# Patient Record
Sex: Female | Born: 1950
Health system: Southern US, Community
[De-identification: ages and names within clinical notes are randomized; demographics above are authoritative.]

## PROBLEM LIST (undated history)

## (undated) DIAGNOSIS — F329 Major depressive disorder, single episode, unspecified: Secondary | ICD-10-CM

## (undated) DIAGNOSIS — E785 Hyperlipidemia, unspecified: Secondary | ICD-10-CM

## (undated) DIAGNOSIS — I10 Essential (primary) hypertension: Secondary | ICD-10-CM

## (undated) DIAGNOSIS — Z8669 Personal history of other diseases of the nervous system and sense organs: Secondary | ICD-10-CM

## (undated) DIAGNOSIS — I2510 Atherosclerotic heart disease of native coronary artery without angina pectoris: Secondary | ICD-10-CM

## (undated) DIAGNOSIS — T8859XA Other complications of anesthesia, initial encounter: Secondary | ICD-10-CM

## (undated) DIAGNOSIS — M1990 Unspecified osteoarthritis, unspecified site: Secondary | ICD-10-CM

## (undated) DIAGNOSIS — M797 Fibromyalgia: Secondary | ICD-10-CM

## (undated) DIAGNOSIS — F419 Anxiety disorder, unspecified: Secondary | ICD-10-CM

## (undated) DIAGNOSIS — F32A Depression, unspecified: Secondary | ICD-10-CM

## (undated) DIAGNOSIS — R7303 Prediabetes: Secondary | ICD-10-CM

## (undated) HISTORY — PX: CARPAL TUNNEL RELEASE: SHX101

## (undated) HISTORY — PX: CHOLECYSTECTOMY: SHX55

## (undated) HISTORY — PX: REPLACEMENT TOTAL KNEE: SUR1224

## (undated) HISTORY — PX: BACK SURGERY: SHX140

## (undated) HISTORY — PX: TOTAL HIP ARTHROPLASTY: SHX124

## (undated) HISTORY — PX: COLONOSCOPY: SHX174

## (undated) HISTORY — DX: Essential (primary) hypertension: I10

## (undated) HISTORY — PX: SHOULDER ARTHROSCOPY: SHX128

## (undated) HISTORY — DX: Personal history of other diseases of the nervous system and sense organs: Z86.69

## (undated) HISTORY — PX: ABDOMINAL HYSTERECTOMY: SHX81

## (undated) HISTORY — PX: CARDIAC CATHETERIZATION: SHX172

## (undated) HISTORY — DX: Hyperlipidemia, unspecified: E78.5

## (undated) HISTORY — PX: TUBAL LIGATION: SHX77

---

## 2000-12-24 ENCOUNTER — Ambulatory Visit (HOSPITAL_COMMUNITY): Admission: RE | Admit: 2000-12-24 | Discharge: 2000-12-24 | Payer: Self-pay | Admitting: General Surgery

## 2000-12-24 ENCOUNTER — Encounter: Payer: Self-pay | Admitting: General Surgery

## 2001-10-29 ENCOUNTER — Encounter: Payer: Self-pay | Admitting: Family Medicine

## 2001-10-29 ENCOUNTER — Ambulatory Visit (HOSPITAL_COMMUNITY): Admission: RE | Admit: 2001-10-29 | Discharge: 2001-10-29 | Payer: Self-pay | Admitting: Family Medicine

## 2001-12-27 ENCOUNTER — Ambulatory Visit (HOSPITAL_BASED_OUTPATIENT_CLINIC_OR_DEPARTMENT_OTHER): Admission: RE | Admit: 2001-12-27 | Discharge: 2001-12-28 | Payer: Self-pay | Admitting: Orthopedic Surgery

## 2002-01-09 ENCOUNTER — Encounter: Admission: RE | Admit: 2002-01-09 | Discharge: 2002-04-09 | Payer: Self-pay | Admitting: Orthopedic Surgery

## 2002-03-17 ENCOUNTER — Ambulatory Visit (HOSPITAL_COMMUNITY): Admission: RE | Admit: 2002-03-17 | Discharge: 2002-03-17 | Payer: Self-pay | Admitting: Orthopedic Surgery

## 2002-03-17 ENCOUNTER — Encounter: Payer: Self-pay | Admitting: Orthopedic Surgery

## 2002-05-06 ENCOUNTER — Encounter: Admission: RE | Admit: 2002-05-06 | Discharge: 2002-06-10 | Payer: Self-pay | Admitting: Neurosurgery

## 2002-07-07 ENCOUNTER — Encounter: Admission: RE | Admit: 2002-07-07 | Discharge: 2002-07-07 | Payer: Self-pay | Admitting: Neurosurgery

## 2002-07-22 ENCOUNTER — Encounter: Admission: RE | Admit: 2002-07-22 | Discharge: 2002-07-22 | Payer: Self-pay | Admitting: Neurosurgery

## 2002-07-24 HISTORY — PX: CORONARY ANGIOPLASTY: SHX604

## 2002-08-01 ENCOUNTER — Observation Stay (HOSPITAL_COMMUNITY): Admission: EM | Admit: 2002-08-01 | Discharge: 2002-08-02 | Payer: Self-pay | Admitting: Cardiology

## 2002-08-05 ENCOUNTER — Ambulatory Visit (HOSPITAL_COMMUNITY): Admission: RE | Admit: 2002-08-05 | Discharge: 2002-08-06 | Payer: Self-pay | Admitting: Cardiology

## 2005-07-20 ENCOUNTER — Ambulatory Visit: Payer: Self-pay | Admitting: Cardiology

## 2006-07-30 ENCOUNTER — Ambulatory Visit: Payer: Self-pay | Admitting: Orthopedic Surgery

## 2006-09-10 ENCOUNTER — Ambulatory Visit: Payer: Self-pay | Admitting: Orthopedic Surgery

## 2006-10-25 ENCOUNTER — Ambulatory Visit: Payer: Self-pay | Admitting: Orthopedic Surgery

## 2006-11-06 ENCOUNTER — Ambulatory Visit (HOSPITAL_COMMUNITY): Admission: RE | Admit: 2006-11-06 | Discharge: 2006-11-06 | Payer: Self-pay | Admitting: Orthopedic Surgery

## 2006-11-06 ENCOUNTER — Ambulatory Visit: Payer: Self-pay | Admitting: Orthopedic Surgery

## 2006-11-08 ENCOUNTER — Ambulatory Visit: Payer: Self-pay | Admitting: Orthopedic Surgery

## 2006-11-09 ENCOUNTER — Encounter (HOSPITAL_COMMUNITY): Admission: RE | Admit: 2006-11-09 | Discharge: 2006-12-09 | Payer: Self-pay | Admitting: Orthopedic Surgery

## 2006-11-29 ENCOUNTER — Ambulatory Visit: Payer: Self-pay | Admitting: Orthopedic Surgery

## 2006-12-24 ENCOUNTER — Ambulatory Visit: Payer: Self-pay | Admitting: Orthopedic Surgery

## 2007-01-28 ENCOUNTER — Ambulatory Visit: Payer: Self-pay | Admitting: Orthopedic Surgery

## 2007-04-11 ENCOUNTER — Ambulatory Visit: Payer: Self-pay | Admitting: Orthopedic Surgery

## 2007-04-12 ENCOUNTER — Encounter: Payer: Self-pay | Admitting: Orthopedic Surgery

## 2007-04-12 DIAGNOSIS — M1710 Unilateral primary osteoarthritis, unspecified knee: Secondary | ICD-10-CM | POA: Insufficient documentation

## 2007-04-12 DIAGNOSIS — M25569 Pain in unspecified knee: Secondary | ICD-10-CM | POA: Insufficient documentation

## 2007-05-02 ENCOUNTER — Encounter: Payer: Self-pay | Admitting: Orthopedic Surgery

## 2007-05-27 ENCOUNTER — Ambulatory Visit: Payer: Self-pay | Admitting: Orthopedic Surgery

## 2007-06-27 ENCOUNTER — Telehealth: Payer: Self-pay | Admitting: Orthopedic Surgery

## 2007-07-03 ENCOUNTER — Ambulatory Visit: Payer: Self-pay | Admitting: Orthopedic Surgery

## 2007-08-08 ENCOUNTER — Ambulatory Visit: Payer: Self-pay | Admitting: Orthopedic Surgery

## 2007-09-04 ENCOUNTER — Encounter: Payer: Self-pay | Admitting: Orthopedic Surgery

## 2007-09-10 ENCOUNTER — Ambulatory Visit: Payer: Self-pay | Admitting: Orthopedic Surgery

## 2007-09-10 ENCOUNTER — Inpatient Hospital Stay (HOSPITAL_COMMUNITY): Admission: RE | Admit: 2007-09-10 | Discharge: 2007-09-13 | Payer: Self-pay | Admitting: Orthopedic Surgery

## 2007-09-13 ENCOUNTER — Encounter: Payer: Self-pay | Admitting: Orthopedic Surgery

## 2007-09-16 ENCOUNTER — Telehealth: Payer: Self-pay | Admitting: Orthopedic Surgery

## 2007-09-24 ENCOUNTER — Ambulatory Visit: Payer: Self-pay | Admitting: Orthopedic Surgery

## 2007-09-24 DIAGNOSIS — Z96659 Presence of unspecified artificial knee joint: Secondary | ICD-10-CM | POA: Insufficient documentation

## 2007-10-08 ENCOUNTER — Encounter: Payer: Self-pay | Admitting: Orthopedic Surgery

## 2007-10-24 ENCOUNTER — Ambulatory Visit: Payer: Self-pay | Admitting: Orthopedic Surgery

## 2007-11-11 ENCOUNTER — Encounter: Payer: Self-pay | Admitting: Orthopedic Surgery

## 2007-11-21 ENCOUNTER — Encounter: Payer: Self-pay | Admitting: Orthopedic Surgery

## 2007-11-26 ENCOUNTER — Encounter: Payer: Self-pay | Admitting: Orthopedic Surgery

## 2007-11-26 ENCOUNTER — Encounter (HOSPITAL_COMMUNITY): Admission: RE | Admit: 2007-11-26 | Discharge: 2007-12-26 | Payer: Self-pay | Admitting: Orthopedic Surgery

## 2007-12-04 ENCOUNTER — Telehealth: Payer: Self-pay | Admitting: Orthopedic Surgery

## 2007-12-11 ENCOUNTER — Ambulatory Visit: Payer: Self-pay | Admitting: Orthopedic Surgery

## 2007-12-27 ENCOUNTER — Encounter (HOSPITAL_COMMUNITY): Admission: RE | Admit: 2007-12-27 | Discharge: 2008-01-26 | Payer: Self-pay | Admitting: Orthopedic Surgery

## 2008-01-15 ENCOUNTER — Ambulatory Visit: Payer: Self-pay | Admitting: Orthopedic Surgery

## 2008-02-17 ENCOUNTER — Telehealth: Payer: Self-pay | Admitting: Orthopedic Surgery

## 2008-03-18 ENCOUNTER — Ambulatory Visit: Payer: Self-pay | Admitting: Orthopedic Surgery

## 2008-03-18 DIAGNOSIS — M6580 Other synovitis and tenosynovitis, unspecified site: Secondary | ICD-10-CM | POA: Insufficient documentation

## 2010-09-19 ENCOUNTER — Other Ambulatory Visit: Payer: Self-pay | Admitting: Orthopedic Surgery

## 2010-09-19 DIAGNOSIS — M25552 Pain in left hip: Secondary | ICD-10-CM

## 2010-09-20 ENCOUNTER — Ambulatory Visit
Admission: RE | Admit: 2010-09-20 | Discharge: 2010-09-20 | Disposition: A | Discharge status: Home / Self Care | Payer: BC Managed Care – PPO | Source: Ambulatory Visit | Attending: Orthopedic Surgery | Admitting: Orthopedic Surgery

## 2010-09-20 DIAGNOSIS — M25552 Pain in left hip: Secondary | ICD-10-CM

## 2010-12-06 NOTE — Discharge Summary (Signed)
[name], [name]                ACCOUNT NO.:  [id number]   MEDICAL RECORD NO.:  [id number]          PATIENT TYPE:  INP   LOCATION:  A331                          FACILITY:  [facility]   PHYSICIAN:  Vickki Hearing, M.D.DATE OF BIRTH:  Jun 07, 1951   DATE OF ADMISSION:  09/10/2007  DATE OF DISCHARGE:  02/20/2009LH                               DISCHARGE SUMMARY   ADMITTING DIAGNOSIS:  Osteoarthritis of the right knee.   DISCHARGE DIAGNOSIS:  Osteoarthritis of the right knee.   OPERATIVE PROCEDURE:  This patient underwent a right total knee  arthroplasty.  The surgery was on the date of admission September 10, 2007, implant DePuy posterior stabilized total knee system with a 3  femur, 3 tibia, 32 patella and 10-mm polyethylene posterior stabilized  insert, done under general anesthetic.   OPERATIVE FINDINGS:  There medial compartment wear, especially the  medial femoral condyle where previous microfracture had been done.  There was moderate random patellofemoral articulation on the patellar  side.  The lateral side was relatively normal.  There was mild varus  alignment to the knee.   The patient tolerated the procedure well, did excellent in therapy.  She  was able to ambulate approximately 130 feet, had range of motion at zero  to 100 degrees on a CPM machine and zero to 98 degrees active assisted  range of motion.   Her follow-up hemoglobin remained at 10.6.  Her PT/INR showed an INR of  1.6 after 3 days of Coumadin.  Otherwise, she did fine.   Wound looked excellent.   She was afebrile with stable vital signs.   She will be discharged home with home health physical therapy three  times a week.  Staples come out September 20, 2007.  She has a  PT/INR  scheduled for Monday, February 23.  A follow-up visit is for March 3 in  the office.   She is full weightbearing as tolerated.   DISCHARGE MEDICATIONS:  She will be discharged on the following  medications:  1. She will  resume her lobe air or Lovaza, continue her Neurontin 400      mg, two tablets at night.  2. Xanax 0.5 mg three times daily.  3. Toprol XL 100 mg daily.  4. Dyazide 37.5/25, one daily.  5. Vitamin E 400 mg daily.  6. Calcium one 500-mg tablet daily.  7. Vitamin C daily.  8. Vitamin B12 daily.  9. Folic acid 1 mg daily.  10.Should take Robaxin 500 mg q.6 p.r.n. for pain for muscle spasm.  11.She is going to use Nicoderm 21 mg patch daily.  12.Percocet 5/325, 1 to 2 tablets q.4 p.r.n. for pain.  13.Coumadin will be 2.5 mg tablet, 2 tablets on Friday, one on      Saturday, and alternate until Thursday, at which she can stop.   Overall condition is improved.   DISPOSITION:  Is to home.      Vickki Hearing, M.D.  Electronically Signed     SEH/MEDQ  D:  09/13/2007  T:  09/13/2007  Job:  [id number]

## 2010-12-06 NOTE — H&P (Signed)
[name], [name]                ACCOUNT NO.:  [id number]   MEDICAL RECORD NO.:  [id number]          PATIENT TYPE:  AMB   LOCATION:  DAY                           FACILITY:  [facility]   PHYSICIAN:  Vickki Hearing, M.D.DATE OF BIRTH:  1951/04/19   DATE OF ADMISSION:  DATE OF DISCHARGE:  LH                              HISTORY & PHYSICAL   CHIEF COMPLAINT:  Right knee pain.   HISTORY:  This is a 60 year old female status post arthroscopy right  knee with microfracture who did well after initial procedure in April  2008.  After approximately 4-6 months, started having increased pain and  swelling.  She was treated with multiple injections, increasing her pain  medication, rest, bracing, did not improve.  Activity modification was  also attempted, and this was unsuccessful in relieving her knee pain.  Her knee pain worsened.  It was finally recommended that she undergo  knee replacement surgery which she consented to.  She has medications of  Xanax 0.5 mg q.6h. p.r.n., Toprol 50 mg daily, Dyazide 37.5 mg daily,  Travatan, Neurontin 1 in the morning 2 at night currently on hold.   ALLERGIES:  PENICILLIN AND NIACIN.   MEDICAL HISTORY:  1. Fibromyalgia.  2. Variations in blood pressure with elevation.   PAST SURGICAL HISTORY:  1. She had a hysterectomy.  2. She had a rotator cuff repair.  3. Carpal tunnel release.  4. She had a catheterization.  5. She had a partial medial meniscectomy and right knee microfracture      medial femoral condyle in April 2008.   She is a smoker, does not drink.   Vital signs will be re-recorded during her preop visit.  Her appearance  was normal.  She had normal development, grooming, hygiene, body  habitus.  HEAD AND NECK:  Normocephalic, atraumatic.  No cervical nodes.  Pulse is normal in all 4 extremities.  SKIN:  Intact with no rashes or lesions.  Knee exam showed a right leg antalgic gait.  Skin over the right knee  was normal.  There were  no major deformities.  She had slight varus, no  swelling in the knee.  Medial joint line was very tender and painful for  her.  Her motor exam was normal.  She had good reflexes, knee and ankle.  Upper extremities showed no abnormalities to visual inspection or  palpation.  There was no instability, contracture, subluxation, atrophy  or tremor.   Right knee exam showed flexion of 125 degrees, -2 on extension.  Negative drawer test and negative collateral ligament tests.   IMPRESSION:  Degenerative joint disease right knee, 715.96.   PLAN:  Right total knee replacement on February 17.   I have discussed the procedure with the patient.  I have answered her  questions.  I discussed bleeding, infection, nerve and vascular  injuries, diagnosis of osteoarthritis with failure to respond to  conservative treatment has been explained.  Surgery is to relieve pain.  Motion and function are secondary gains.  The patient demonstrates  understanding of this discussion.  She was advised to  go to preop total  knee replacement classes.  Specific to this procedure, risks include  stiffness, pain, loosening, and infection along with deep vein  thrombosis, pulmonary embolus.   She is currently on Lorcet 10/650 which may pose a problem in terms of  postop pain relief.   The patient to have right total knee as stated.  The patient will follow  up 2 weeks after surgery.  Appointment has been arranged.      Vickki Hearing, M.D.  Electronically Signed     SEH/MEDQ  D:  09/09/2007  T:  09/09/2007  Job:  [id number]   cc:   [facility]

## 2010-12-06 NOTE — Op Note (Signed)
NAME:  [name], [name]                ACCOUNT NO.:  [id number]   MEDICAL RECORD NO.:  [id number]          PATIENT TYPE:  AMB   LOCATION:  DAY                           FACILITY:  [facility]   PHYSICIAN:  Vickki Hearing, M.D.DATE OF BIRTH:  1951/01/14   DATE OF PROCEDURE:  09/10/2007  DATE OF DISCHARGE:                               OPERATIVE REPORT   HISTORY:  This is a 60 year old female who is status post an  arthroscopy, partial medial meniscectomy of the right knee, with  microfracture of the medial femoral condyle, arthritis and torn medial  meniscus.  She did well for approximately six months then started to  deteriorate with increased pain and frequent effusions.  We treated her  at that point with activity modification, bracing, injections of  cortisone, increased her pain medicine, tried some arthritic medications  and she did not improve. Her medial joint space progressively narrowed  and we recommended total knee replacement.  Informed consent was done in  the office.  She consented for right total knee arthroplasty.   PREOPERATIVE DIAGNOSIS:  Osteoarthritis of the right knee.   POSTOPERATIVE DIAGNOSIS:  Osteoarthritis of the right knee.   PROCEDURE:  Right total knee replacement.   IMPLANTS:  We used DePuy posterior stabilized total knee with a 3 femur,  3 tibia, 32 patella and a 10 mm polyethylene posterior stabilized  insert.  She also had a pain pump catheter inserted in the subcutaneous  space using 0.25% Marcaine.   PROCEDURE IN DETAIL:  After proper identification of the patient,  updating the history and physical, the patient was taken to surgery  where she had general anesthetic. Vancomycin had been started prior.  A  Foley catheter was inserted. A tourniquet was applied to her right  proximal thigh.  The right leg was then prepped with Hibiclens due to a  Betadine allergy.  After sterile prep and drape, the time out procedure  was completed and said procedure  was confirmed and patient identity  confirmed.  The limb was exsanguinated with a 6 inch Esmarch and the  tourniquet was inflated to 300 mmHg were it stayed for 78 minutes.   A straight incision was made over the patella, extended proximally and  distally down to the extensor mechanism.  A medial parapatellar incision  was performed.  The patella was everted laterally.  The soft tissue  sleeve medially was elevated to the mid coronal plane.  The medial and  lateral menisci were resected.  The ACL and PCL were also resected.   OPERATIVE FINDINGS:  At this point, we saw severe wear of the medial  compartment, especially on the medial femoral condyle.  There was  moderate wear of the patellofemoral articulation on the patellar side.  The lateral side seemed relatively normal.  There was mild varus  alignment to the knee  The tibial guide was set for a neutral cut with 0  degrees of slope.  We referenced the medial worn compartment and  undercut the defect by 4 mm while we used an oscillating saw to remove  the proximal tibia.  We then turned our attention to the femur.  A drill was inserted into  the canal.  The canal was decompressed with suction irrigation.  The  distal femoral cut was set for 5 degrees with a 10 mm resection. The  distal femoral cutting block was pinned in place, distal femoral cut was  made and found to be flat.  The extension space was measured using a  spacer block and found to be adequate.   The femur was then sized to a size 3.  The femur was cut in 3 degrees  external rotation. Four cuts were made with an oscillating saw.  This  flexion gap was then checked and found to equal the extension gap with  good count lateral ligament  tension.  The box cut was then made. The  patella was cut from a 22 mm to 11 mm thickness and the three peg holes  were drilled.  A trial reduction was performed with the 10 mm insert. No  further releases were necessary.  The patella  tracked normally with a no  touch technique.  The femoral lug holes were drilled, the proximal tibia  was prepared with the reamer and punch.   The knee was irrigated and the bone was dried while the cement was  mixed. We then cemented the components in place with a 10 mm trial  posterior stabilized insert. Range of motion was 2-3 degrees of  hyperextension with 125 degrees of flexion with good stability at 90 and  0 degrees.  The trial insert was removed.  Cement had been allowed to  cure.  Excess cement was removed from the knee.  The knee was irrigated  and a 10 mm polyethylene insert was then placed, checked for security,  and the knee was reduced. Range of motion was checked. The  patellofemoral kinetics were good. The flexion extension was - to 3  degrees hyperextension plus 125 degrees of flexion.   The knee was then injected 0.25% Marcaine with epinephrine, 30 mL total.  The extensor mechanism was closed with interrupted #1 Bralon sutures  with the knee in flexion.  0 and 2-0 Monocryl were then used to close  the skin over a pain pump catheter.  Radiographs were taken  intraoperatively, alignment was confirmed, and position of implants were  deemed excellent.  Skin staples were used to close the skin.  A sterile  dressing was applied.  The limb was wrapped from the foot to the groin.  The patient was extubated and taken to the recovery room where she was  placed in a CPM at 0 to 90 degrees.  She will be placed on Coumadin  starting tonight with 10 mg and titrated up to an INR of 2.  Otherwise,  standard total knee protocol.      Vickki Hearing, M.D.  Electronically Signed     SEH/MEDQ  D:  09/10/2007  T:  09/10/2007  Job:  [id number]

## 2010-12-09 NOTE — Discharge Summary (Signed)
NAME:  [name], [name] NO.:  [id number]   MEDICAL RECORD NO.:  [id number]                   PATIENT TYPE:  OIB   LOCATION:  6526                                 FACILITY:  [facility]   PHYSICIAN:  Jonelle Sidle, M.D. [facility]        DATE OF BIRTH:  03-Feb-1951   DATE OF ADMISSION:  08/05/2002  DATE OF DISCHARGE:  08/06/2002                           DISCHARGE SUMMARY - REFERRING   PROCEDURE:  1. Cardiac catheterization.  2. Coronary arteriogram.  3. Left ventriculogram.   HOSPITAL COURSE:  [name] is a 60 year old female with a strong family  history of coronary artery disease as well as hypertension, hyperlipidemia,  and tobacco abuse. She was admitted to [facility] hospital on 08/01/02 for  chest pain and transferred to [facility] for further evaluation.  She ended up being discharged on 08/03/02 with an outpatient stress test  performed on 1/12. The stress test was abnormal, and she was readmitted to  the hospital on 08/05/02 for cardiac catheterization and further evaluation.   [name] had a cardiac catheterization which showed no significant disease  in the left main circumflex RCA. Her EF was normal with no MR. She had a 40  to 50% stenosis in the LAD as well as an exercise treadmill test that was  positive for ischemia in this distribution, and therefore decision was made  to perform an intervention. She had PTCA and stent with a Cypher stent to  the LAD, reducing the stenosis to 0 with TIMI-III flow. There was less than  20% residual stenosis in the diagonal. This was non-flow limiting. She  tolerated the procedure well, and sheath was removed without difficulty. She  was placed on Integrilin for 18 hours and Plavix for three months.   The next day, [name] had no significant problems with her groin site and  was ambulating without chest pain or shortness of breath. Her postprocedure  laboratory values were within normal limits except  for a potassium of 3.1.  This will be supplemented prior to discharge. Pending completion of her  Integrilin and additional ambulation, she is considered stable for discharge  on 08/06/02 with outpatient followup arranged.   LABORATORY DATA:  Hemoglobin 12.0, hematocrit 34.9, WBCs 8.3, platelets 292.  Sodium 134, potassium 3.1, chloride 101, CO2 38, BUN 11, creatinine 0.7,  glucose 96. CK-MB postprocedure 97/2.0.   DISCHARGE CONDITION:  Stable.   DISCHARGE DIAGNOSES:  1. Unstable anginal pain, status post percutaneous transluminal coronary     angioplasty and Cypher stent to the LAD this admission.  2. No other critical coronary artery disease and preserved EF by     catheterization this admission.  3. Hypertension.  4. Hyperlipidemia.  5. Hypokalemia.  6. Obesity.  7. Tobacco abuse.  8. Family history of premature coronary artery disease.  9. Status post hysterectomy.  10.      Allergy to penicillin and Niacin.  11.  Status post rotator cuff repair and carpal tunnel.  12.      Chronic back pain.  13.      Fibromyalgia.   DISCHARGE INSTRUCTIONS:  Her activity level is to include no driving or  strenuous activity for two days. She is to stick to a low-fat diet. She  states that she is on the Atkin's diet, and this was discussed. She is to  call the office with problems with the catheterization site. She is to see  the P.A. for Dr. [name] on 1/28 at 1:45. She is to follow up with her  family physician as needed.     Lavella Hammock, P.A. [facility]                  Jonelle Sidle, M.D. [facility]    RG/MEDQ  D:  08/06/2002  T:  08/06/2002  Job:  [id number]   cc:   Selinda Flavin  [address]  [place]  [place] [address]  Fax: [phone]

## 2010-12-09 NOTE — Op Note (Signed)
[place]  Patient:    [name], [name] Visit Number: [id number] MRN: [id number]          Service Type: DSU Location: [facility] Attending Physician:  Teena Dunk Dictated by:   Sharlot Gowda., M.D. Proc. Date: 12/27/01 Admit Date:  12/27/2001                             Operative Report  PREOPERATIVE DIAGNOSES: 1. Rotator cuff tear. 2. Labral tear. 3. Impingement, acromioclavicular joint arthritis. 4. Carpal tunnel. 5. Synovitis of wrist.  POSTOPERATIVE DIAGNOSES: 1. Rotator cuff tear. 2. Labral tear. 3. Impingement, acromioclavicular joint arthritis. 4. Carpal tunnel. 5. Synovitis of wrist.  PROCEDURE: 1. Open rotator cuff repair, acromioplasty. 2. Arthroscopic debridement, torn labrum. 3. Open distal clavicle excision. 4. Open right carpal tunnel release. 5. Synovectomy, right wrist.  SURGEON:  W. Su Ley., M.D.  ASSESSMENT:  Jamelle Rushing, P.A.  INDICATION:  A 60 year old with nerve conduction-proven, rather severe carpal tunnel, right-sided rotator cuff tear, thought to be amenable to outpatient with overnight hospitalization.  DESCRIPTION OF PROCEDURE:  Sterile prep and drape, exsanguination of the arm, inflation of arm tourniquet to 300.  Curvilinear incision made in line with the thenar crease, dissection carried down on the ulnar side of the transverse carpal ligament.  Release of the ligament on the ulnar side of the ligament distally to the level of the superficial arch and proximally to the antebrachial fascia of the wrist.  The nerve was under moderate constriction. Mild synovitis was present within the flexor tendons, was debrided. Irrigation and closure with nylon with placement of a bulky dressing.  Re-prep and drape in the beach chair position.  Arthroscopic approach to the anterior, lateral, and posterior portals.  About a 2-3 cm tear was identified.  There was moderate degenerative  tearing in the anterior superior labrum, which was debrided with the arthroscope separate from the open procedure.  The procedure was next converted to an open procedure.  Splitting of the deltoid 2-3 cm distal to the tip of the acromion, exposure of the clavicle, and extremely hypertrophied and degenerative clavicle was excised over 1-1.5 cm with additional resection of a very hypertrophied anterior leading edge of the acromion, with resection of about 6-7 mm of bone on the anterior leading edge of the acromion.  This revealed the cuff tear.  The cuff edges were freshened with a 15 blade.  Bursectomy was carried out.  One absorbable Arthrex anchor was placid with two sutures on it, with one Mitek anchor as an anchoring stitch on the greater tuberosity to create essentially a watertight repair of the 2 cm tear in the supraspinatus.  Closure was effected with #1 Tycron on the deltoid and aponeurosis, followed by 0 Vicryl, 2-0 Vicryl, and Monocryl. Marcaine without epinephrine infiltrated in the skin of the shoulder and the hand, and the hand was placed in a bulky dressing, volar plaster splint, and redressed.  Taken to the recovery room in stable condition. Dictated by:   Sharlot Gowda., M.D. Attending Physician:  Teena Dunk DD:  12/27/01 TD:  12/30/01 Job: [id number]

## 2010-12-09 NOTE — Cardiovascular Report (Signed)
NAME:  CAYLIE, SANDQUIST NO.:  [id number]   MEDICAL RECORD NO.:  [id number]                   PATIENT TYPE:  OIB   LOCATION:  6526                                 FACILITY:  [facility]   PHYSICIAN:  Salvadore Farber, M.D. [facility]         DATE OF BIRTH:  [birth year]   DATE OF PROCEDURE:  08/05/2002  DATE OF DISCHARGE:                              CARDIAC CATHETERIZATION   PROCEDURE:  Pressure wire of the left anterior descending artery, stent to  left anterior descending artery with kissing balloon angioplasty of diagonal  #1.   INDICATIONS FOR PROCEDURE:  The patient is a 60 year old lady who presented  last week with chest pain suggestive of myocardial ischemia; however,  electrocardiogram was normal, and cardiac enzymes were negative after a  prolonged period of pain.  Furthermore, echocardiogram done during pain  demonstrated no regional wall motion abnormality.  She was discharged to  home and underwent an outpatient exercise Cardiolite yesterday.  This  demonstrated anterolateral ischemia.  She was, therefore, scheduled for  catheterization today.  It was performed by Drs. Harkin and Hochrein who  found moderate stenosis of the LAD at the ostium of the first diagonal  branch.  Because of the contradictory findings on multiple studies and a  moderate stenosis, I am asked to perform pressure wire interrogation of the  lesion with possible intervention based on the flow wire results.   PROCEDURAL TECHNIQUE:  Anticoagulation was initiated with heparin to achieve  an ACT of greater than 250 seconds.  A JL 3.5 guide was advanced over wire  and engaged in the ostium of the left coronary artery via the preexisting  right femoral arterial sheath.  The flow wire was then advanced into the  proximal LAD where pressure was normalized.  It was then advanced distal to  the lesion.  Intracoronary adenosine, 42 mcg, was administered.  This  demonstrated an FFR of 0.73  which suggests a physiologically significant  stenosis; therefore, plans were made for intervention.   Integrilin was given and adjunctive heparin given to maintain an ACT of  greater than 225 seconds.  A BMW wire was advanced into the distal LAD.  The  lesion was directly stented using a 2.5 x 18-mm CYPHER, deployed at 14  atmospheres.  With this deployment, there was a severe ostial stenosis of  the first diagonal branch with TIMI 1-2 flow in the first diagonal.  To  avoid running a wire under a stent strut, the stent was then post dilated  using a 2.5 x 18-mm PowerSail at 16 atmospheres.  A Whisper wire was then  advanced into the first diagonal branch with some difficulty.  A 2.0 x 15-mm  Maverick was then used to perform balloon angioplasty of the D-1 ostium for  a total of two inflations at 8 atmospheres for a total of 120 seconds.  This  resulted in restoration of TIMI-3 flow and  approximately 20% residual  stenosis.  I then finished with kissing balloon angioplasty of the D-1 and  LAD using a 2.0-mm Maverick in the D-1 and a 2.5-mm Maverick in the LAD,  both at 6 atmospheres for 30 seconds.  Final angiograms demonstrated no  residual stenosis in the LAD and approximately 20% residual stenosis in D-1.  There was a possible nonflow-limiting dissection in D-1.  Flow was TIMI-3 in  both vessels.    IMPRESSION/RECOMMENDATIONS:  Careful stenting of the left anterior  descending artery with kissing balloon angioplasty of the moderate-sized  first diagonal branch.  Sheath will be removed when the ACT is less than 150  seconds.  Integrilin will be continued for 18 hours.  Plavix should be  continued for a minimum of three months given the drug-eluting stent placed.  Aspirin will be continued indefinitely.                                               Salvadore Farber, M.D. [facility]    WED/MEDQ  D:  08/05/2002  T:  08/06/2002  Job:  [id number]   cc:   Jonelle Sidle, M.D. [facility]  [address]  [place]  [place] [address]  Fax: [phone]   Selinda Flavin  [address]  [place]  [place] [address]  Fax: [phone]

## 2010-12-09 NOTE — Op Note (Signed)
[name], [name]                ACCOUNT NO.:  [id number]   MEDICAL RECORD NO.:  [id number]          PATIENT TYPE:  AMB   LOCATION:  DAY                           FACILITY:  [facility]   PHYSICIAN:  Vickki Hearing, M.D.DATE OF BIRTH:  [birth year]   DATE OF PROCEDURE:  11/06/2006  DATE OF DISCHARGE:  11/06/2006                               OPERATIVE REPORT   DIAGNOSES:  1. Posterior horn tear of medial meniscus.  2. Bone contusion plus or minus avascular necrosis, medial femoral      condyle.   INDICATION:  This patient is 61 years old.  She has medial knee pain on  the right.  She was treated with ibuprofen, Darvocet, activity level  modification, reduced activity, bracing for 6 weeks, did not improve and  presented with acute pain and swelling.  Even after that treatment,  aspiration of the knee was done; the patient improved.  Cortisone was  injected, but since she had not improved after 6-8 weeks of treatment,  we decided to go ahead and perform an arthroscopy.   POSTOPERATIVE DIAGNOSES:  1. Arthritis.  2. Torn medial meniscus.   SURGEON:  Vickki Hearing, M.D.   ANESTHETIC:  Spinal.   OPERATIVE FINDINGS:  There was a tear of the posterior horn of the  medial meniscus.  There was a chondral lesion of medial femoral condyle.   PROCEDURE:  Partial medial meniscectomy, microfracture of medial femoral  condyle, right knee.   DETAILS OF PROCEDURE:  After properly identifying the patient in the  preop holding area and marking the right knee as the surgical site,  surgeon countersigned this, history and physical was updated,  antibiotics were started and the patient was taken to the operating room  for spinal anesthetic.   After sterile prep and drape, a time-out procedure was completed.   Diagnostic arthroscopy was performed through a lateral portal.   A probe was introduced through a medial portal.  Articular structures  and intra-articular ligaments were probed;  findings are as listed.  Partial medial meniscectomy was performed with a duckbill forceps and  motorized shaver.  A probe was used to confirm that the meniscus rim was  stable.   Next, a chondral pick was used to perform a medial femoral condyle  microfracture.   The arthroscopy fluid was stopped to observe the bleeding from the  microfracture holes that were placed in the bone; this was successful.   Knee was then irrigated and closed with Steri-Strips.  We injected  Marcaine and applied sterile dressing.   POSTOPERATIVE PLAN:  CPM, nonweightbearing for 6 weeks, physical therapy  to start next week, followup next week.      Vickki Hearing, M.D.  Electronically Signed     SEH/MEDQ  D:  11/30/2006  T:  11/30/2006  Job:  [id number]

## 2010-12-09 NOTE — Cardiovascular Report (Signed)
NAME:  [name], SIRES NO.:  [id number]   MEDICAL RECORD NO.:  [id number]                   PATIENT TYPE:   LOCATION:                                       FACILITY:   PHYSICIAN:  Vida Roller, M.D.                DATE OF BIRTH:  [birth year]   DATE OF PROCEDURE:  08/05/2002  DATE OF DISCHARGE:                              CARDIAC CATHETERIZATION   PRIMARY CARDIOLOGIST:  Jonelle Sidle, M.D.   PROCEDURE PERFORMED:  1. Left heart catheterization.  2. Selective coronary angiography.  3. Left ventriculography.  4. Right femoral artery arteriogram.   HISTORY OF PRESENT ILLNESS:  The patient is a 60 year old white female  followed by Dr. Diona [name] in [place] who presents with recurrent atypical chest  discomfort. Was evaluated in the clinic with an echocardiogram which  revealed preserved left ventricular ejection fraction with no evidence of  wall motion abnormalities.  She was referred for a perfusion study in which  she received adenosine that showed a preserved left ventricular function  with evidence of an anterior defect from mid to apex in the ventricle,  consistent with ischemia.  She was referred for elective heart  catheterization.   DETAILS OF PROCEDURE:  After obtaining informed consent, the patient was  brought to the cardiac catheterization laboratory in the fasting state.  There she was prepped and draped in the usual sterile manner, and conscious  sedation was obtained using 1 mg of Versed.  The right femoral area was  anesthetized using 1% lidocaine without epinephrine, and the right femoral  artery was cannulated using a modified Seldinger technique with a #6 French  10-cm sheath.  Left heart catheterization was performed using a #6 French  Judkins left #4 and a #6 [place] non-torquing right cardiac catheter.  Left  ventriculography was performed using an angled pigtail catheter which was  advanced under fluoroscopic guidance up to  the ascending aorta.  It was then  prolapsed across the aortic valve.  Pressure tracings were obtained in the  ventricle, and then the catheter was connected to the Medrad system where a  power injection at 10 cc/second for a total of 30 cc at a half-second rate  of rise, 600 psi.  Left ventriculogram was imaged in the 30-degree RAO view.  At the conclusion of the procedures, the catheters were removed.  Right  femoral arteriogram was performed using a hand injection through the sheath.  The sheath was then maintained in place, and please see the subsequent  interventional procedure dictated by my colleague, Dr. Geralynn Rile.   RESULTS:  1. Aortic pressure was measured at 140/60 with a mean arterial pressure of     93 mmHg.  2. Left ventricular pressure was measured at 140/80 with an end diastolic     pressure of 15 mmHg.   SELECTIVE CORONARY ANGIOGRAPHY:  1. The left main coronary artery  is a small vessel which is angiographically     normal.  2. The left circumflex coronary artery is a small caliber vessel which is     angiographically unremarkable.  3. The left anterior descending coronary artery is a small caliber vessel     with evidence of a 40-50% lesion in the mid portion of the artery just at     the takeoff of a small diagonal branch.  4. The right coronary artery is a small dominant vessel with a very small     posterior descending coronary artery.  It is angiographically     unremarkable.  There is evidence, upon engagement of the catheter, of     minimal ostial spasm that resolves with removal of the catheter.  5. Left ventriculography reveals preserved left ventricular function with     evidence of significant ventricular tachycardia with injection.  There is     no mitral regurgitation and no obvious wall motion abnormalities.   ASSESSMENT:  This is a lady with significant perfusion abnormality on a  noninvasive evaluation and a lesion which potentially could represent  the  site for this ischemia.  We will, therefore, refer this patient immediately  for evaluation by our interventional cardiology colleagues for an assessment  of the flow across this lesion.                                               Vida Roller, M.D.    JH/MEDQ  D:  08/05/2002  T:  08/06/2002  Job:  [id number]   cc:   Rollene Rotunda, M.D. [facility]  [address]  [place]  [place] [address]  Fax: 1   Salvadore Farber, M.D. [facility]  [address] [place], [place] [address]  Fax: [phone]

## 2010-12-09 NOTE — H&P (Signed)
NAME:  [name], [name]                ACCOUNT NO.:  [id number]   MEDICAL RECORD NO.:  [id number]          PATIENT TYPE:  AMB   LOCATION:  DAY                           FACILITY:  [facility]   PHYSICIAN:  Vickki Hearing, M.D.DATE OF BIRTH:  April 01, 1951   DATE OF ADMISSION:  DATE OF DISCHARGE:  LH                              HISTORY & PHYSICAL   [name] more DOB Aug 24, 1950   CHIEF COMPLAINT:  Right knee pain.   HISTORY:  A 60 year old female who had an MRI, which showed a possible  medial meniscal tear with degenerative joint disease.  This was  confirmed with plain film.  She had medial knee pain and a possibility  of medial femoral condylar infarct.  She was treated with 6 weeks of  bracing, reduced activity, came in with persistent pain.  She was also  treated with ibuprofen and Darvocet and activity level modification.  She did not do well, and after initial evaluation in January, I saw her  again on October 27, 2006 with increased pain and swelling, recommended  arthroscopy, possible microfracture, but primarily depending on  arthroscopic findings, treatment will be determined at that point.   She initially had pain and swelling in the back and front of the knee.  When she twisted and turned on the leg, it caused pain.  She could not  bear weight in the beginning.   REVIEW OF SYSTEMS:  She listed all as negative.   ALLERGIES:  SHE IS ALLERGIC TO PENICILLIN, NIACIN, DYES USED FOR  CATHETERIZATION.   PAST MEDICAL HISTORY:  She has fibromyalgia, slight blood pressure  problem.  She has had a hysterectomy, rotator cuff repair carpal tunnel  release, heart cath. She takes Xanax, Toprol, Dyazide, hydrocodone,  Travatan and Darvocet initially.   FAMILY HISTORY:  Heart disease, arthritis, cancer, leukemia.   FAMILY PHYSICIAN:  Dr. Selinda Flavin.   SOCIAL HISTORY:  She is married.  She is a housewife.  She smokes a pack  a day, drinks one to two drinks of wine per week and coffee three  to  four cups daily, 11th grade education.   PHYSICAL EXAMINATION:  VITAL SIGNS:  Weight 161, pulse 70, respiratory  rate is 18.  HEENT:  Normal.  NECK:  Supple.  CHEST:  Clear.  HEART:  Rate and rhythm is normal.  ABDOMEN:  Soft.  UPPER EXTREMITIES:  Benign.  Left lower extremity is normal.  Right  lower extremity:  There is medial joint line tenderness, small joint  effusion.  This does contribute to loss of motion of approximately 15  degrees in flexion and 5 in extension.  She had a negative meniscal sign  for McMurray and screw home test, but the condylar pain was significant.   IMPRESSION:  After reviewing her MRI which showed a grade 3 signal in  the posterior horn of the medial meniscus and the bone contusion  plus/minus AVN.  It is recommended that she undergo arthroscopy of the  right knee.  During that time, would determine if she needs  microfracture or just meniscectomy of the right knee,  arthroscopically.      Vickki Hearing, M.D.  Electronically Signed     SEH/MEDQ  D:  11/05/2006  T:  11/05/2006  Job:  [id number]   cc:   [facility]   Selinda Flavin, M.D.

## 2010-12-20 ENCOUNTER — Telehealth: Payer: Self-pay | Admitting: Cardiology

## 2010-12-20 NOTE — Telephone Encounter (Signed)
Faxed LOV to [facility] @ [facility] ([phone]).

## 2010-12-26 ENCOUNTER — Other Ambulatory Visit: Payer: Self-pay | Admitting: Orthopaedic Surgery

## 2010-12-26 ENCOUNTER — Encounter (HOSPITAL_COMMUNITY): Payer: BC Managed Care – PPO

## 2010-12-26 ENCOUNTER — Ambulatory Visit (HOSPITAL_COMMUNITY)
Admission: RE | Admit: 2010-12-26 | Discharge: 2010-12-26 | Disposition: A | Discharge status: Home / Self Care | Payer: BC Managed Care – PPO | Source: Ambulatory Visit | Attending: Orthopaedic Surgery | Admitting: Orthopaedic Surgery

## 2010-12-26 ENCOUNTER — Other Ambulatory Visit (HOSPITAL_COMMUNITY): Payer: Self-pay | Admitting: Orthopaedic Surgery

## 2010-12-26 DIAGNOSIS — I498 Other specified cardiac arrhythmias: Secondary | ICD-10-CM | POA: Insufficient documentation

## 2010-12-26 DIAGNOSIS — Z01811 Encounter for preprocedural respiratory examination: Secondary | ICD-10-CM

## 2010-12-26 DIAGNOSIS — Z01812 Encounter for preprocedural laboratory examination: Secondary | ICD-10-CM | POA: Insufficient documentation

## 2010-12-26 DIAGNOSIS — Z01810 Encounter for preprocedural cardiovascular examination: Secondary | ICD-10-CM | POA: Insufficient documentation

## 2010-12-26 DIAGNOSIS — Z01818 Encounter for other preprocedural examination: Secondary | ICD-10-CM | POA: Insufficient documentation

## 2010-12-26 LAB — SURGICAL PCR SCREEN
MRSA, PCR: NEGATIVE
Staphylococcus aureus: NEGATIVE

## 2010-12-26 LAB — CBC
HCT: 40.3 % (ref 36.0–46.0)
Hemoglobin: 13.8 g/dL (ref 12.0–15.0)
MCH: 29.6 pg (ref 26.0–34.0)
MCHC: 34.2 g/dL (ref 30.0–36.0)
MCV: 86.3 fL (ref 78.0–100.0)
RDW: 12.5 % (ref 11.5–15.5)

## 2010-12-26 LAB — BASIC METABOLIC PANEL
BUN: 14 mg/dL (ref 6–23)
CO2: 30 mEq/L (ref 19–32)
Calcium: 9.9 mg/dL (ref 8.4–10.5)
Creatinine, Ser: 0.68 mg/dL (ref 0.4–1.2)
Glucose, Bld: 92 mg/dL (ref 70–99)

## 2010-12-26 LAB — URINALYSIS, ROUTINE W REFLEX MICROSCOPIC
Bilirubin Urine: NEGATIVE
Glucose, UA: NEGATIVE mg/dL
Hgb urine dipstick: NEGATIVE
Ketones, ur: NEGATIVE mg/dL
pH: 7 (ref 5.0–8.0)

## 2010-12-26 LAB — ABO/RH: ABO/RH(D): A POS

## 2010-12-30 ENCOUNTER — Inpatient Hospital Stay (HOSPITAL_COMMUNITY): Payer: BC Managed Care – PPO

## 2010-12-30 ENCOUNTER — Inpatient Hospital Stay (HOSPITAL_COMMUNITY)
Admission: RE | Admit: 2010-12-30 | Discharge: 2011-01-02 | DRG: 818 | Disposition: A | Discharge status: Home Health | Payer: BC Managed Care – PPO | Source: Ambulatory Visit | Attending: Orthopaedic Surgery | Admitting: Orthopaedic Surgery

## 2010-12-30 DIAGNOSIS — M1610 Unilateral primary osteoarthritis, unspecified hip: Principal | ICD-10-CM | POA: Diagnosis present

## 2010-12-30 DIAGNOSIS — E876 Hypokalemia: Secondary | ICD-10-CM | POA: Diagnosis not present

## 2010-12-30 DIAGNOSIS — E871 Hypo-osmolality and hyponatremia: Secondary | ICD-10-CM | POA: Diagnosis not present

## 2010-12-30 DIAGNOSIS — D62 Acute posthemorrhagic anemia: Secondary | ICD-10-CM | POA: Diagnosis not present

## 2010-12-30 DIAGNOSIS — M169 Osteoarthritis of hip, unspecified: Principal | ICD-10-CM | POA: Diagnosis present

## 2010-12-30 DIAGNOSIS — I2510 Atherosclerotic heart disease of native coronary artery without angina pectoris: Secondary | ICD-10-CM | POA: Diagnosis present

## 2010-12-30 DIAGNOSIS — F17200 Nicotine dependence, unspecified, uncomplicated: Secondary | ICD-10-CM | POA: Diagnosis present

## 2010-12-30 DIAGNOSIS — Z9861 Coronary angioplasty status: Secondary | ICD-10-CM

## 2010-12-30 DIAGNOSIS — I10 Essential (primary) hypertension: Secondary | ICD-10-CM | POA: Diagnosis present

## 2010-12-30 LAB — TYPE AND SCREEN: Antibody Screen: NEGATIVE

## 2010-12-31 LAB — CBC
Hemoglobin: 10.7 g/dL — ABNORMAL LOW (ref 12.0–15.0)
MCH: 29.5 pg (ref 26.0–34.0)
MCHC: 34.5 g/dL (ref 30.0–36.0)

## 2010-12-31 LAB — BASIC METABOLIC PANEL
BUN: 7 mg/dL (ref 6–23)
Calcium: 7.9 mg/dL — ABNORMAL LOW (ref 8.4–10.5)
GFR calc non Af Amer: >60 mL/min (ref >60)
Glucose, Bld: 123 mg/dL — ABNORMAL HIGH (ref 70–99)
Sodium: 129 mEq/L — ABNORMAL LOW (ref 135–145)

## 2011-01-01 LAB — CBC
MCH: 29.5 pg (ref 26.0–34.0)
MCHC: 35 g/dL (ref 30.0–36.0)
Platelets: 215 10*3/uL (ref 150–400)
RBC: 3.25 MIL/uL — ABNORMAL LOW (ref 3.87–5.11)

## 2011-01-01 LAB — BASIC METABOLIC PANEL
Calcium: 8.5 mg/dL (ref 8.4–10.5)
Sodium: 125 mEq/L — ABNORMAL LOW (ref 135–145)

## 2011-01-02 LAB — CBC
Platelets: 220 10*3/uL (ref 150–400)
RBC: 3.2 MIL/uL — ABNORMAL LOW (ref 3.87–5.11)
WBC: 8.8 10*3/uL (ref 4.0–10.5)

## 2011-01-02 LAB — BASIC METABOLIC PANEL
CO2: 32 mEq/L (ref 19–32)
Calcium: 8.3 mg/dL — ABNORMAL LOW (ref 8.4–10.5)
Chloride: 92 mEq/L — ABNORMAL LOW (ref 96–112)
Sodium: 129 mEq/L — ABNORMAL LOW (ref 135–145)

## 2011-01-07 NOTE — Op Note (Signed)
NAMECHANTAL, [name]                ACCOUNT NO.:  [id number]  MEDICAL RECORD NO.:  [id number]  LOCATION:  1618                         FACILITY:  [facility]  PHYSICIAN:  Vanita Panda. Magnus Ivan, M.D.DATE OF BIRTH:  [birth year]  DATE OF PROCEDURE:  12/30/2010 DATE OF DISCHARGE:                              OPERATIVE REPORT   PREOPERATIVE DIAGNOSES:  Severe osteoarthritis and degenerative joint disease, left hip.  POSTOPERATIVE DIAGNOSES:  Severe osteoarthritis and degenerative joint disease, left hip.  PROCEDURE:  Left total hip arthroplasty through direct anterior approach.  IMPLANTS:  DePuy Sector Pinnacle acetabular component size 50 with Gription, size 8 Corail femoral component with standard offset, size 32 +1 ceramic head.  SURGEON:  Vanita Panda. Magnus Ivan, M.D.  ASSISTANT:  Janace Litten, OPA  ANESTHESIA:  General.  BLOOD LOSS:  300 cc.  COMPLICATIONS:  None.  INDICATIONS:  [name] is a 60 year old female with debilitating osteoarthritis involving her left hip.  She presents for left total hip arthroplasty after the failure of conservative treatment including antiinflammatories and injections.  She understands the risks and benefits of surgery including risk of acute blood, anemia, DVT and PE. She does wish to proceed with surgery.  DESCRIPTION OF PROCEDURE:  After informed consent was obtained, appropriate left hip was marked, she was brought to the operating room and general anesthesia was obtained while she was on the stretcher.  A Foley catheter was placed and had traction boots placed on her feet and she was placed on  the Hana operative table at the perineal post as well.  The bed was raised and we were able to prep and drape the left hip with ChloraPrep and sterile drapes.  A time-out was called, she was identified as correct patient and correct left hip.  I did assess the hip preoperatively under fluoroscopic guidance to obtain the center of the pelvis  for judging leg lengths.  I made an incision 1 cm distal and 3 cm posterior to the anterior superior iliac spine and carried this down the leg.  I dissected down through the tensor fasciae latae and we placed a protractor guide in place.  We then divided the tensor fasciae latae and proceeded with a direct anterior approach of the hip.  Cobra retractors were placed around the lateral neck and the medial neck, and then I was able to cauterize the lateral femoral circumflex vessels.  I then divided the capsule and placed Cobra retractor within the capsule. I then made a saw cut with an oscillating saw and removed the head with a corkscrew guide.  We cleaned the acetabular debris and then placed a Hohmann retractor in the front and retracted posteriorly.  I cleaned the acetabular debris as well as the labrum.  We then began reaming from size 43 up to a 47.  After the 47 reamer, we reamed with 47 and then 49 under direct fluoroscopic guidance.  We then chose to place a 50 acetabular component.  I cleaned the acetabular debris and irrigated this and placed a real acetabular component, which is DePuy size 50 Sector cup with 3 screw holes.  We did not need to use the screw holes. We placed the  hole eliminator guide and then the real neutral +4, 32 polyethylene liner.  Attention was then turned to the femur, all traction was off the bed.  We placed a temporary hook underneath the vastus ridge and then we externally rotated leg to 90 degrees, extended and adducted the hip.  We gained access to the femoral canal then, I released tissue around the greater trochanter and then was able to place the size 8 Corail broach and surprisingly this was stable.  We placed the standard neck in 32 +1 head, we reduced this into acetabulum and surprisingly this was the component we wanted to go with.  Her leg lengths were equal.  She was stable with internal, external rotation in minimal shuck.  I then removed the  trial components and placed the real hydroxyapatite-coated size 8 stem with a collar and standard offset size 8, we placed the real 32 +1 femoral head and reduced this back into the acetabulum and it was stable.  I irrigated the tissues and then closed the joint capsule with interrupted #1 Ethibond suture followed by reapproximating the tensor fasciae latae with running #1 Vicryl.  We closed with 2-0 Vicryl and 4-0 Monocryl and Dermabond on the skin.  She was then taken to the operating table, awakened, extubated, and taken to the recovering room in stable condition.  All final counts were correct and there were no complications noted.     Vanita Panda. Magnus Ivan, M.D.     CYB/MEDQ  D:  12/30/2010  T:  12/30/2010  Job:  [id number]  Electronically Signed by Doneen Poisson M.D. on 01/07/2011 08:33:37 PM

## 2011-01-07 NOTE — H&P (Signed)
  NAMECAROLEE, CHANNELL                ACCOUNT NO.:  [id number]  MEDICAL RECORD NO.:  [id number]  LOCATION:  1618                         FACILITY:  [facility]  PHYSICIAN:  Vanita Panda. Magnus Ivan, M.D.DATE OF BIRTH:  03/10/1951  DATE OF ADMISSION:  12/30/2010 DATE OF DISCHARGE:                             HISTORY & PHYSICAL   CHIEF COMPLAINT:  Severe left hip pain.  HISTORY OF PRESENT ILLNESS:  Ms. Rosano is a 60 year old with left hip significant osteoarthritis, which is causing pain and affecting her activities of daily living.  Had a steroid injection in her hip before, but it is just really not calming down.  She cannot get around on this hip well.  X-rays show end-stage arthritis of her hip.  She at this point wishes to proceed with a total hip arthroplasty.  The risks and benefits of this have been explained to her in detail.  She does wish to proceed with surgery.  PAST MEDICAL HISTORY:  Depression, anxiety, high blood pressure, tobacco abuse, and fibromyalgia.  MEDICATIONS:  Gabapentin, hydrocodone, Norvasc, Flexeril, Xanax, Toprol, Maxzide.  ALLERGIES:  NIACIN, CONTRAST DYE, PENICILLIN.  PAST SURGICAL HISTORY:  Complete knee replacement on the right knee, rotator cuff surgery on both shoulders.  FAMILY HISTORY:  Diabetes, heart disease, high blood pressure, and fibromyalgia.  SOCIAL HISTORY:  She is married.  She is a housewife.  She does smoke a pack a day and she does drink socially.  REVIEW OF SYSTEMS:  Negative for chest pain, shortness of breath, fevers, chills, nausea, and vomiting.  PHYSICAL EXAMINATION:  VITAL SIGNS:  She is afebrile with stable vital signs. GENERAL:  She is alert and oriented x3, in no acute distress or obvious discomfort. HEENT:  Normocephalic, atraumatic.  Pupils are equal, round and reactive to light.  Extraocular muscles intact. NECK:  Supple. LUNGS:  Clear to auscultation bilaterally. HEART:  Regular rate and rhythm. ABDOMEN:   Benign. EXTREMITIES:  Left hip shows pain with internal and external rotation. Leg lengths are equal.  She is neurovascularly intact.  X-rays reviewed and show a significant narrowing of the joint space on the left hip when comparing the left and right hips.  ASSESSMENT:  This is a 60 year old female with end-stage arthritis of her left hip, which is affecting her activities of daily living.  We will proceed with a left total hip arthroplasty with the direct anterior approach today.  I have explained the risks and benefits to this to her including the risk of acute blood loss anemia.  She does wish to proceed with surgery.     Vanita Panda. Magnus Ivan, M.D.     CYB/MEDQ  D:  12/30/2010  T:  12/30/2010  Job:  [id number]  Electronically Signed by Doneen Poisson M.D. on 01/07/2011 08:33:34 PM

## 2011-01-21 NOTE — Discharge Summary (Signed)
  NAMEYOUNG, BRIM                ACCOUNT NO.:  [id number]  MEDICAL RECORD NO.:  [id number]  LOCATION:  1618                         FACILITY:  [facility]  PHYSICIAN:  Vanita Panda. Magnus Ivan, M.D.DATE OF BIRTH:  1951-03-20  DATE OF ADMISSION:  12/30/2010 DATE OF DISCHARGE:  01/02/2011                              DISCHARGE SUMMARY   ADMITTING DIAGNOSIS:  Severe osteoarthritis, left hip.  DISCHARGE DIAGNOSIS:  Severe osteoarthritis, left hip.  PROCEDURE:  Left total hip arthroplasty on December 30, 2010.  HOSPITAL COURSE:  Ms. Peggs is a female with debilitating arthritis involving her left hip.  She was taken to the operating room on the day of admission for a left total hip arthroplasty, which was performed without complications.  She was then admitted to the orthopedic floor as an inpatient.  Her postoperative course was uneventful.  By the day of discharge, she was tolerating an oral diet as well as oral pain medications.  Her incision was clean, dry, and intact as well as she be discharged safely to home.  DISPOSITION:  Discharged to home.  DISCHARGE INSTRUCTIONS:  While she is at home, she will work on balance, gait training, and coordination with weight bearing as tolerated.  No hip precautions on her left hip.  She can get her hip incision wet in the shower and I will see her back in the office in 2 weeks postop.  DISCHARGE MEDICATIONS: 1. Percocet. 2. Xarelto. 3. Robaxin.     Vanita Panda. Magnus Ivan, M.D.     CYB/MEDQ  D:  01/17/2011  T:  01/18/2011  Job:  [id number]  Electronically Signed by Doneen Poisson M.D. on 01/21/2011 05:10:35 PM

## 2011-04-14 LAB — CBC
HCT: 29.7 — ABNORMAL LOW
HCT: 29.9 — ABNORMAL LOW
HCT: 30.2 — ABNORMAL LOW
MCHC: 35.5
MCHC: 36
MCV: 88.1
MCV: 88
MCV: 88.9
Platelets: 223
Platelets: 231
Platelets: 237
RBC: 4.82
RDW: 12.3
RDW: 12.4
WBC: 8.1
WBC: 10

## 2011-04-14 LAB — CROSSMATCH

## 2011-04-14 LAB — BASIC METABOLIC PANEL
BUN: 6
BUN: 7
BUN: 7
CO2: 31
Calcium: 8.4
Chloride: 102
Chloride: 102
Chloride: 98
Creatinine, Ser: 0.78
GFR calc Af Amer: >60
GFR calc non Af Amer: >60
GFR calc non Af Amer: >60
Glucose, Bld: 108 — ABNORMAL HIGH
Glucose, Bld: 110 — ABNORMAL HIGH
Potassium: 4.5
Potassium: 4.1
Potassium: 4.3

## 2011-04-14 LAB — DIFFERENTIAL
Basophils Absolute: 0
Basophils Absolute: 0
Basophils Relative: 0
Eosinophils Absolute: 0.1
Eosinophils Absolute: 0.1
Eosinophils Relative: 1
Eosinophils Relative: 1
Eosinophils Relative: 3
Lymphocytes Relative: 20
Lymphs Abs: 2.5
Monocytes Relative: 9

## 2011-04-14 LAB — PROTIME-INR
Prothrombin Time: 12.2
Prothrombin Time: 14.8
Prothrombin Time: 19.8 — ABNORMAL HIGH
Prothrombin Time: 22 — ABNORMAL HIGH

## 2011-04-14 LAB — ABO/RH: ABO/RH(D): A POS

## 2014-10-14 ENCOUNTER — Other Ambulatory Visit: Payer: Self-pay | Admitting: Family Medicine

## 2014-10-14 DIAGNOSIS — Z139 Encounter for screening, unspecified: Secondary | ICD-10-CM

## 2014-10-21 ENCOUNTER — Inpatient Hospital Stay: Admission: RE | Admit: 2014-10-21 | Payer: Self-pay | Source: Ambulatory Visit

## 2016-04-17 DIAGNOSIS — E7800 Pure hypercholesterolemia, unspecified: Secondary | ICD-10-CM | POA: Diagnosis not present

## 2016-04-17 DIAGNOSIS — F419 Anxiety disorder, unspecified: Secondary | ICD-10-CM | POA: Diagnosis not present

## 2016-04-17 DIAGNOSIS — Z23 Encounter for immunization: Secondary | ICD-10-CM | POA: Diagnosis not present

## 2016-04-17 DIAGNOSIS — F17200 Nicotine dependence, unspecified, uncomplicated: Secondary | ICD-10-CM | POA: Diagnosis not present

## 2016-04-17 DIAGNOSIS — I10 Essential (primary) hypertension: Secondary | ICD-10-CM | POA: Diagnosis not present

## 2016-04-17 DIAGNOSIS — R739 Hyperglycemia, unspecified: Secondary | ICD-10-CM | POA: Diagnosis not present

## 2016-04-20 DIAGNOSIS — E7800 Pure hypercholesterolemia, unspecified: Secondary | ICD-10-CM | POA: Diagnosis not present

## 2016-04-20 DIAGNOSIS — F419 Anxiety disorder, unspecified: Secondary | ICD-10-CM | POA: Diagnosis not present

## 2016-04-20 DIAGNOSIS — I10 Essential (primary) hypertension: Secondary | ICD-10-CM | POA: Diagnosis not present

## 2016-04-20 DIAGNOSIS — M797 Fibromyalgia: Secondary | ICD-10-CM | POA: Diagnosis not present

## 2016-09-05 DIAGNOSIS — Z6835 Body mass index (BMI) 35.0-35.9, adult: Secondary | ICD-10-CM | POA: Diagnosis not present

## 2016-09-05 DIAGNOSIS — M1611 Unilateral primary osteoarthritis, right hip: Secondary | ICD-10-CM | POA: Diagnosis not present

## 2016-09-21 DIAGNOSIS — Z6835 Body mass index (BMI) 35.0-35.9, adult: Secondary | ICD-10-CM | POA: Diagnosis not present

## 2016-09-21 DIAGNOSIS — M1611 Unilateral primary osteoarthritis, right hip: Secondary | ICD-10-CM | POA: Diagnosis not present

## 2016-09-21 DIAGNOSIS — M797 Fibromyalgia: Secondary | ICD-10-CM | POA: Diagnosis not present

## 2016-09-21 DIAGNOSIS — F419 Anxiety disorder, unspecified: Secondary | ICD-10-CM | POA: Diagnosis not present

## 2016-09-28 DIAGNOSIS — M4726 Other spondylosis with radiculopathy, lumbar region: Secondary | ICD-10-CM | POA: Diagnosis not present

## 2016-09-28 DIAGNOSIS — M25551 Pain in right hip: Secondary | ICD-10-CM | POA: Diagnosis not present

## 2016-10-03 DIAGNOSIS — M25551 Pain in right hip: Secondary | ICD-10-CM | POA: Diagnosis not present

## 2016-10-03 DIAGNOSIS — M25511 Pain in right shoulder: Secondary | ICD-10-CM | POA: Diagnosis not present

## 2016-10-06 DIAGNOSIS — Z7901 Long term (current) use of anticoagulants: Secondary | ICD-10-CM | POA: Diagnosis not present

## 2016-10-06 DIAGNOSIS — F17200 Nicotine dependence, unspecified, uncomplicated: Secondary | ICD-10-CM | POA: Diagnosis not present

## 2016-10-06 DIAGNOSIS — R739 Hyperglycemia, unspecified: Secondary | ICD-10-CM | POA: Diagnosis not present

## 2016-10-06 DIAGNOSIS — E7800 Pure hypercholesterolemia, unspecified: Secondary | ICD-10-CM | POA: Diagnosis not present

## 2016-10-06 DIAGNOSIS — I10 Essential (primary) hypertension: Secondary | ICD-10-CM | POA: Diagnosis not present

## 2016-10-06 DIAGNOSIS — E559 Vitamin D deficiency, unspecified: Secondary | ICD-10-CM | POA: Diagnosis not present

## 2016-10-06 DIAGNOSIS — F419 Anxiety disorder, unspecified: Secondary | ICD-10-CM | POA: Diagnosis not present

## 2016-10-06 DIAGNOSIS — M1611 Unilateral primary osteoarthritis, right hip: Secondary | ICD-10-CM | POA: Diagnosis not present

## 2016-10-10 DIAGNOSIS — M25561 Pain in right knee: Secondary | ICD-10-CM | POA: Diagnosis not present

## 2016-10-10 DIAGNOSIS — M1611 Unilateral primary osteoarthritis, right hip: Secondary | ICD-10-CM | POA: Diagnosis not present

## 2016-10-10 DIAGNOSIS — M25551 Pain in right hip: Secondary | ICD-10-CM | POA: Diagnosis not present

## 2016-10-16 DIAGNOSIS — R5383 Other fatigue: Secondary | ICD-10-CM | POA: Diagnosis not present

## 2016-10-16 DIAGNOSIS — I10 Essential (primary) hypertension: Secondary | ICD-10-CM | POA: Diagnosis not present

## 2016-10-16 DIAGNOSIS — Z6835 Body mass index (BMI) 35.0-35.9, adult: Secondary | ICD-10-CM | POA: Diagnosis not present

## 2016-10-16 DIAGNOSIS — R05 Cough: Secondary | ICD-10-CM | POA: Diagnosis not present

## 2016-10-16 DIAGNOSIS — Z0000 Encounter for general adult medical examination without abnormal findings: Secondary | ICD-10-CM | POA: Diagnosis not present

## 2016-10-16 DIAGNOSIS — R0989 Other specified symptoms and signs involving the circulatory and respiratory systems: Secondary | ICD-10-CM | POA: Diagnosis not present

## 2016-10-16 DIAGNOSIS — F17200 Nicotine dependence, unspecified, uncomplicated: Secondary | ICD-10-CM | POA: Diagnosis not present

## 2016-11-08 DIAGNOSIS — M545 Low back pain: Secondary | ICD-10-CM | POA: Diagnosis not present

## 2016-11-08 DIAGNOSIS — M25551 Pain in right hip: Secondary | ICD-10-CM | POA: Diagnosis not present

## 2016-11-08 DIAGNOSIS — M5410 Radiculopathy, site unspecified: Secondary | ICD-10-CM | POA: Diagnosis not present

## 2016-11-15 DIAGNOSIS — M545 Low back pain: Secondary | ICD-10-CM | POA: Diagnosis not present

## 2016-11-15 DIAGNOSIS — M5410 Radiculopathy, site unspecified: Secondary | ICD-10-CM | POA: Diagnosis not present

## 2016-11-15 DIAGNOSIS — M47816 Spondylosis without myelopathy or radiculopathy, lumbar region: Secondary | ICD-10-CM | POA: Diagnosis not present

## 2016-11-15 DIAGNOSIS — M48061 Spinal stenosis, lumbar region without neurogenic claudication: Secondary | ICD-10-CM | POA: Diagnosis not present

## 2016-11-15 DIAGNOSIS — M9983 Other biomechanical lesions of lumbar region: Secondary | ICD-10-CM | POA: Diagnosis not present

## 2016-11-22 DIAGNOSIS — M5410 Radiculopathy, site unspecified: Secondary | ICD-10-CM | POA: Diagnosis not present

## 2016-11-22 DIAGNOSIS — M25551 Pain in right hip: Secondary | ICD-10-CM | POA: Diagnosis not present

## 2016-12-06 DIAGNOSIS — M4316 Spondylolisthesis, lumbar region: Secondary | ICD-10-CM | POA: Diagnosis not present

## 2016-12-06 DIAGNOSIS — M5416 Radiculopathy, lumbar region: Secondary | ICD-10-CM | POA: Diagnosis not present

## 2016-12-08 ENCOUNTER — Other Ambulatory Visit: Payer: Self-pay | Admitting: Neurological Surgery

## 2016-12-27 ENCOUNTER — Other Ambulatory Visit: Payer: Self-pay | Admitting: *Deleted

## 2016-12-27 ENCOUNTER — Telehealth: Payer: Self-pay | Admitting: Orthopedic Surgery

## 2016-12-27 ENCOUNTER — Telehealth (INDEPENDENT_AMBULATORY_CARE_PROVIDER_SITE_OTHER): Payer: Self-pay | Admitting: Radiology

## 2016-12-27 NOTE — Telephone Encounter (Signed)
[name] called requesting something faxed to office in regards to pre-med prior to dental appt. Patient is in the chair now. She had left total hip in 2012. Per Caryl Pina, no premed required. I faxed to [facility] at [phone]

## 2016-12-27 NOTE — Telephone Encounter (Signed)
SPOKE WITH RECEPTIONIST BRENDA AND GAVE VERBAL ORDER FOR Marueno 500MG  PO FOUR CAPSULES PRIOR TO DENTAL WORK

## 2016-12-27 NOTE — Telephone Encounter (Signed)
Dr. Reather Laurence office called asking if the patient needs antibiotics since she is having dental work done. She is there in the chair. The nurse stated that if she did we could send them a fax stating what we would recommend. She had TKA surgery 09/10/2007

## 2017-01-01 ENCOUNTER — Other Ambulatory Visit: Payer: Self-pay | Admitting: Neurological Surgery

## 2017-01-01 ENCOUNTER — Encounter (HOSPITAL_COMMUNITY)
Admission: RE | Admit: 2017-01-01 | Discharge: 2017-01-01 | Disposition: A | Discharge status: Home / Self Care | Payer: Medicare Other | Source: Ambulatory Visit | Attending: Neurological Surgery | Admitting: Neurological Surgery

## 2017-01-01 ENCOUNTER — Encounter (HOSPITAL_COMMUNITY): Payer: Self-pay

## 2017-01-01 DIAGNOSIS — Z01810 Encounter for preprocedural cardiovascular examination: Secondary | ICD-10-CM | POA: Insufficient documentation

## 2017-01-01 DIAGNOSIS — Z01812 Encounter for preprocedural laboratory examination: Secondary | ICD-10-CM | POA: Diagnosis not present

## 2017-01-01 HISTORY — DX: Depression, unspecified: F32.A

## 2017-01-01 HISTORY — DX: Fibromyalgia: M79.7

## 2017-01-01 HISTORY — DX: Anxiety disorder, unspecified: F41.9

## 2017-01-01 HISTORY — DX: Atherosclerotic heart disease of native coronary artery without angina pectoris: I25.10

## 2017-01-01 HISTORY — DX: Major depressive disorder, single episode, unspecified: F32.9

## 2017-01-01 HISTORY — DX: Unspecified osteoarthritis, unspecified site: M19.90

## 2017-01-01 HISTORY — DX: Other complications of anesthesia, initial encounter: T88.59XA

## 2017-01-01 LAB — BASIC METABOLIC PANEL
Anion gap: 8 (ref 5–15)
BUN: 13 mg/dL (ref 6–20)
CHLORIDE: 98 mmol/L — AB (ref 101–111)
CO2: 32 mmol/L (ref 22–32)
CREATININE: 0.9 mg/dL (ref 0.44–1.00)
Calcium: 9.7 mg/dL (ref 8.9–10.3)
GFR calc Af Amer: >60 mL/min (ref >60)
GLUCOSE: 105 mg/dL — AB (ref 65–99)
POTASSIUM: 3.5 mmol/L (ref 3.5–5.1)
SODIUM: 138 mmol/L (ref 135–145)

## 2017-01-01 LAB — CBC
HEMATOCRIT: 40.1 % (ref 36.0–46.0)
Hemoglobin: 13.5 g/dL (ref 12.0–15.0)
MCH: 29.6 pg (ref 26.0–34.0)
MCHC: 33.7 g/dL (ref 30.0–36.0)
MCV: 87.9 fL (ref 78.0–100.0)
PLATELETS: 395 10*3/uL (ref 150–400)
RBC: 4.56 MIL/uL (ref 3.87–5.11)
RDW: 12.9 % (ref 11.5–15.5)
WBC: 9.3 10*3/uL (ref 4.0–10.5)

## 2017-01-01 LAB — TYPE AND SCREEN
ABO/RH(D): A POS
Antibody Screen: NEGATIVE

## 2017-01-01 LAB — SURGICAL PCR SCREEN
MRSA, PCR: NEGATIVE
STAPHYLOCOCCUS AUREUS: NEGATIVE

## 2017-01-01 LAB — ABO/RH: ABO/RH(D): A POS

## 2017-01-01 NOTE — Pre-Procedure Instructions (Signed)
[name]  01/01/2017      [facility] - [place], [place] [address] Phone: [phone] Fax: [phone]    Your procedure is scheduled on Friday June 15.  Report to [facility] Admitting at 7:45 A.M.  Call this number if you have problems the morning of surgery:  [phone]   Remember:  Do not eat food or drink liquids after midnight.  Take these medicines the morning of surgery with A SIP OF WATER: metoprolol (lopresssor), amlodipine (norvasc), gabapentin (neurontin), tramadol (ultram) if needed, xanax (Alprazolam) if needed  7 days prior to surgery STOP taking any Aspirin, Aleve, Naproxen, Ibuprofen, Motrin, Advil, Goody's, BC's, all herbal medications, fish oil, and all vitamins    Do not wear jewelry, make-up or nail polish.  Do not wear lotions, powders, or perfumes, or deoderant.  Do not shave 48 hours prior to surgery.  Men may shave face and neck.  Do not bring valuables to the hospital.  [facility] is not responsible for any belongings or valuables.  Contacts, dentures or bridgework may not be worn into surgery.  Leave your suitcase in the car.  After surgery it may be brought to your room.  For patients admitted to the hospital, discharge time will be determined by your treatment team.  Patients discharged the day of surgery will not be allowed to drive home.    Special instructions:    [place]- Preparing For Surgery  Before surgery, you can play an important role. Because skin is not sterile, your skin needs to be as free of germs as possible. You can reduce the number of germs on your skin by washing with CHG (chlorahexidine gluconate) Soap before surgery.  CHG is an antiseptic cleaner which kills germs and bonds with the skin to continue killing germs even after washing.  Please do not use if you have an allergy to CHG or antibacterial soaps. If your skin becomes reddened/irritated  stop using the CHG.  Do not shave (including legs and underarms) for at least 48 hours prior to first CHG shower. It is OK to shave your face.  Please follow these instructions carefully.   1. Shower the NIGHT BEFORE SURGERY and the MORNING OF SURGERY with CHG.   2. If you chose to wash your hair, wash your hair first as usual with your normal shampoo.  3. After you shampoo, rinse your hair and body thoroughly to remove the shampoo.  4. Use CHG as you would any other liquid soap. You can apply CHG directly to the skin and wash gently with a scrungie or a clean washcloth.   5. Apply the CHG Soap to your body ONLY FROM THE NECK DOWN.  Do not use on open wounds or open sores. Avoid contact with your eyes, ears, mouth and genitals (private parts). Wash genitals (private parts) with your normal soap.  6. Wash thoroughly, paying special attention to the area where your surgery will be performed.  7. Thoroughly rinse your body with warm water from the neck down.  8. DO NOT shower/wash with your normal soap after using and rinsing off the CHG Soap.  9. Pat yourself dry with a CLEAN TOWEL.   10. Wear CLEAN PAJAMAS   11. Place CLEAN SHEETS on your bed the night of your first shower and DO NOT SLEEP WITH PETS.    Day of Surgery: Do not apply any  deodorants/lotions. Please wear clean clothes to the hospital/surgery center.      Please read over the following fact sheets that you were given. MRSA Information

## 2017-01-01 NOTE — Progress Notes (Signed)
PCP - Rory Percy Cardiologist -pt denies recent cardiac issues or having a cardiologist.    EKG - 01/01/2017  Patient unsure whether she had cardiac stress test or echo, but states she thinks she had a stress test at [facility]. Records requested.   Pt reports having CATH done at [facility] in the past and stent placed, pt unsure of when this cath was performed.   Patient denies shortness of breath, fever, cough and chest pain at PAT appointment  Patient verbalized understanding of instructions that were given to them at the PAT appointment. Patient was also instructed that they will need to review over the PAT instructions again at home before surgery.

## 2017-01-03 NOTE — Progress Notes (Addendum)
Anesthesia Chart Review:  Pt is a L3-4, L4-5 PLIF on 01/05/2017 with Kristeen Miss, MD  PMH includes:  CAD, HTN.  Current smoker. BMI 34  Medications include: amlodipine, ASA 81 mg, lipitor, lasix, lisinopril-HCTZ, metoprolol.   Preoperative labs reviewed.    EKG 01/01/17: suspect arm lead reversal.  Will repeat DOS.   Echo 04/27/15 [facility]):  1. The study quality is fair. 2. LV chamber size is normal. Global LV wall motion and contractility are within normal limits. Estimated EF 60-65%. 3. No old study for comparison.  Cardiac cath 08/05/02:  1. The left main coronary artery is a small vessel which is angiographically normal. 2. The left circumflex coronary artery is a small caliber vessel which is angiographically unremarkable. 3. The left anterior descending coronary artery is a small caliber vessel with evidence of a 40-50% lesion in the mid portion of the artery just at the takeoff of a small diagonal branch. 4. The right coronary artery is a small dominant vessel with a very small posterior descending coronary artery.  It is angiographically unremarkable.  There is evidence, upon engagement of the catheter, of minimal ostial spasm that resolves with removal of the catheter. 5. Left ventriculography reveals preserved left ventricular function with evidence of significant ventricular tachycardia with injection.  There is no mitral regurgitation and no obvious wall motion abnormalities. -  ASSESSMENT:  This is a lady with significant perfusion abnormality on a noninvasive evaluation and a lesion which potentially could represent the site for this ischemia.   - INTERVENTION: Careful stenting of the left anterior descending artery with kissing balloon angioplasty of the moderate-sized first diagonal branch.   I spoke with pt by telephone.  She was active until around 5 months ago when she began having back pain.  Now can no longer do her own grocery shopping or mop the floor due to  back pain.  Denies CP or SOB. Has apparently not seen cardiology since cath in 2004.   I reviewed case with Dr. Jenita Seashore.  Pt will need stress test/evaluation by cardiology prior to surgery. I notified Janett Billow in Dr. Clarice Pole office.   Willeen Cass, FNP-BC [facility]/Anesthesiology Phone: [phone] 01/03/2017 4:01 PM  Addendum:  Pt saw cardiologist Minus Breeding, MD on 01/10/17. He recommended a stress test which took place 01/12/17, results below.   Nuclear stress test 01/12/17:   There was no ST segment deviation noted during stress.  Findings consistent with prior antoerlateral myocardial infarction with mild peri-infarct ischemia.  This is a low risk study.  The left ventricular ejection fraction is normal (55-65%).  If no changes, I anticipate pt can proceed with surgery as scheduled.   Willeen Cass, FNP-BC [facility]/Anesthesiology Phone: [phone] 01/12/2017 4:42 PM

## 2017-01-08 ENCOUNTER — Encounter: Payer: Self-pay | Admitting: Cardiology

## 2017-01-08 NOTE — Progress Notes (Addendum)
Cardiology Office Note   Date:  01/11/2017   ID:  Amayrany, Cafaro 06-27-1951, MRN [id number]  PCP:  Rory Percy, MD  Cardiologist:   Minus Breeding, MD  Referring:  Rory Percy, MD   Chief Complaint  Patient presents with  . Coronary Artery Disease      History of Present Illness: [name] is a 66 y.o. female who is referred by Dr. Ellene Route for preop evaluation.    She has a history of CAD with moderate stenosis of the LAD with an abnormal stress test.  She had a Cypher DES to the LAD with kissing balloon angioplasty of a diagonal in 2004.  She's very limited in her abilities because of knee pain more than back pain. She's not had any recent chest pressure, neck or arm discomfort. She's not had any palpitations, presyncope or syncope. However, she sometimes uses a walker to walk on level ground. I do see an echocardiogram done in 2016.    Past Medical History:  Diagnosis Date  . Anxiety   . Arthritis   . Complication of anesthesia    "don't wake up easily"  . Coronary artery disease    LAD Cypher stent with PCI of the diagonal in 2004.    . Depression   . Dyslipidemia   . Fibromyalgia   . Headache    history of migraines  . Hypertension     Past Surgical History:  Procedure Laterality Date  . ABDOMINAL HYSTERECTOMY     partial first, then full  . CARDIAC CATHETERIZATION    . CARPAL TUNNEL RELEASE Right   . CHOLECYSTECTOMY    . hip replacement Left   . REPLACEMENT TOTAL KNEE Right   . SHOULDER ARTHROSCOPY Right   . TUBAL LIGATION       Current Outpatient Prescriptions  Medication Sig Dispense Refill  . ALPRAZolam (XANAX) 0.5 MG tablet Take 0.5 mg by mouth 3 (three) times daily as needed for anxiety.    Marland Kitchen amLODipine (NORVASC) 10 MG tablet Take 10 mg by mouth daily.    Marland Kitchen aspirin EC 81 MG tablet Take 81 mg by mouth daily.    Marland Kitchen atorvastatin (LIPITOR) 10 MG tablet Take 10 mg by mouth at bedtime.    . furosemide (LASIX) 40 MG tablet Take 40 mg by mouth  daily.    Marland Kitchen gabapentin (NEURONTIN) 400 MG capsule Take 800 mg by mouth 2 (two) times daily.    Marland Kitchen lisinopril-hydrochlorothiazide (PRINZIDE,ZESTORETIC) 10-12.5 MG tablet Take 1 tablet by mouth daily.    . metoprolol tartrate (LOPRESSOR) 100 MG tablet Take 100 mg by mouth 2 (two) times daily.    . traMADol (ULTRAM) 50 MG tablet Take 100 mg by mouth 3 (three) times daily as needed.      No current facility-administered medications for this visit.     Allergies:   Iodine; Adhesive [tape]; Niacin; and Penicillins    Social History:  The patient  reports that she has been smoking Cigarettes.  She has been smoking about 0.50 packs per day. She has never used smokeless tobacco. She reports that she drinks alcohol. She reports that she does not use drugs.   Family History:  The patient's family history includes Cancer in her brother; Heart attack (age of onset: 40) in her father.    ROS:  Please see the history of present illness.   Otherwise, review of systems are positive for none.   All other systems are reviewed and negative.  PHYSICAL EXAM: VS:  BP 120/72 (BP Location: Left Arm, Cuff Size: Large)   Pulse (!) 47   Ht 5\' 2"  (1.575 m)   Wt 186 lb 6.4 oz (84.6 kg)   SpO2 98%   BMI 34.09 kg/m  , BMI Body mass index is 34.09 kg/m. GENERAL:  Well appearing HEENT:  Pupils equal round and reactive, fundi not visualized, oral mucosa unremarkable NECK:  No jugular venous distention, waveform within normal limits, carotid upstroke brisk and symmetric, bilateral bruits, no thyromegaly LYMPHATICS:  No cervical, inguinal adenopathy LUNGS:  Clear to auscultation bilaterally BACK:  No CVA tenderness CHEST:  Unremarkable HEART:  PMI not displaced or sustained,S1 and S2 within normal limits, no S3, no S4, no clicks, no rubs, no murmurs ABD:  Flat, positive bowel sounds normal in frequency in pitch, no bruits, no rebound, no guarding, no midline pulsatile mass, no hepatomegaly, no splenomegaly EXT:  2  plus pulses throughout, no edema, no cyanosis no clubbing SKIN:  No rashes no nodules NEURO:  Cranial nerves II through XII grossly intact, motor grossly intact throughout PSYCH:  Cognitively intact, oriented to person place and time    EKG:  EKG is not ordered today. The ekg ordered 01/01/2017 demonstrates sinus rhythm, rate 49, axis within normal limits, intervals within normal limits, no acute ST-T wave changes.   Recent Labs: 01/01/2017: BUN 13; Creatinine, Ser 0.90; Hemoglobin 13.5; Platelets 395; Potassium 3.5; Sodium 138    Lipid Panel No results found for: CHOL, TRIG, HDL, CHOLHDL, VLDL, LDLCALC, LDLDIRECT    Wt Readings from Last 3 Encounters:  01/10/17 186 lb 6.4 oz (84.6 kg)  01/01/17 186 lb 8 oz (84.6 kg)      Other studies Reviewed: Additional studies/ records that were reviewed today include: Office records and old echo and cath. Review of the above records demonstrates:  Please see elsewhere in the note.     ASSESSMENT AND PLAN:   PREOP EVALUATION:   Given the patient's low functional status and previous coronary disease she needs to be screened with a treadmill test. She would not be able to walk on a treadmill so she will need a Lexiscan Myoview.  CAD:  This will be evaluated as above.  BRUIT:  She will need screening carotid Doppler.   RISK REDUCTION:  I am unable to see her most recent labs Nadara Mustard, Lennette Bihari, MD fax machine not working.)  I will defer to his management.   Current medicines are reviewed at length with the patient today.  The patient does not have concerns regarding medicines.  The following changes have been made:  no change  Labs/ tests ordered today include:   Orders Placed This Encounter  Procedures  . US Carotid Duplex Bilateral  . Myocardial Perfusion Imaging     Disposition:   FU with [facility] provider in 18 months or sooner pending the above results.     Signed, Minus Breeding, MD  01/11/2017 8:53 AM    [facility]

## 2017-01-10 ENCOUNTER — Ambulatory Visit (INDEPENDENT_AMBULATORY_CARE_PROVIDER_SITE_OTHER): Payer: Medicare Other | Admitting: Cardiology

## 2017-01-10 ENCOUNTER — Encounter: Payer: Self-pay | Admitting: Cardiology

## 2017-01-10 ENCOUNTER — Telehealth: Payer: Self-pay | Admitting: Cardiology

## 2017-01-10 VITALS — BP 120/72 | HR 47 | Ht 62.0 in | Wt 186.4 lb

## 2017-01-10 DIAGNOSIS — I209 Angina pectoris, unspecified: Secondary | ICD-10-CM

## 2017-01-10 DIAGNOSIS — Z01810 Encounter for preprocedural cardiovascular examination: Secondary | ICD-10-CM | POA: Diagnosis not present

## 2017-01-10 DIAGNOSIS — R0989 Other specified symptoms and signs involving the circulatory and respiratory systems: Secondary | ICD-10-CM | POA: Diagnosis not present

## 2017-01-10 DIAGNOSIS — I25119 Atherosclerotic heart disease of native coronary artery with unspecified angina pectoris: Secondary | ICD-10-CM | POA: Diagnosis not present

## 2017-01-10 NOTE — Telephone Encounter (Signed)
Pre-cert Verification for the following procedure    Stress test & dopplers scheduled for 01/12/17 at [facility]

## 2017-01-10 NOTE — Patient Instructions (Signed)
Medication Instructions:  Your physician recommends that you continue on your current medications as directed. Please refer to the Current Medication list given to you today.  Labwork: NONE  Testing/Procedures: Your physician has requested that you have en exercise stress myoview. For further information please visit [email or web address]. Please follow instruction sheet, as given.  Your physician has requested that you have a carotid duplex. This test is an ultrasound of the carotid arteries in your neck. It looks at blood flow through these arteries that supply the brain with blood. Allow one hour for this exam. There are no restrictions or special instructions.   Follow-Up: Your physician wants you to follow-up in: [place] will receive a reminder letter in the mail two months in advance. If you don't receive a letter, please call our office to schedule the follow-up appointment.  Any Other Special Instructions Will Be Listed Below (If Applicable).  If you need a refill on your cardiac medications before your next appointment, please call your pharmacy.

## 2017-01-11 ENCOUNTER — Other Ambulatory Visit: Payer: Self-pay

## 2017-01-11 ENCOUNTER — Encounter: Payer: Self-pay | Admitting: Cardiology

## 2017-01-11 DIAGNOSIS — I25119 Atherosclerotic heart disease of native coronary artery with unspecified angina pectoris: Secondary | ICD-10-CM | POA: Insufficient documentation

## 2017-01-11 DIAGNOSIS — I25118 Atherosclerotic heart disease of native coronary artery with other forms of angina pectoris: Secondary | ICD-10-CM

## 2017-01-11 DIAGNOSIS — R0989 Other specified symptoms and signs involving the circulatory and respiratory systems: Secondary | ICD-10-CM | POA: Insufficient documentation

## 2017-01-11 DIAGNOSIS — Z01810 Encounter for preprocedural cardiovascular examination: Secondary | ICD-10-CM | POA: Insufficient documentation

## 2017-01-12 ENCOUNTER — Encounter (HOSPITAL_COMMUNITY): Payer: Self-pay

## 2017-01-12 ENCOUNTER — Ambulatory Visit (HOSPITAL_BASED_OUTPATIENT_CLINIC_OR_DEPARTMENT_OTHER)
Admission: RE | Admit: 2017-01-12 | Discharge: 2017-01-12 | Disposition: A | Discharge status: Home / Self Care | Payer: Medicare Other | Source: Ambulatory Visit | Attending: Cardiology | Admitting: Cardiology

## 2017-01-12 ENCOUNTER — Ambulatory Visit (HOSPITAL_COMMUNITY)
Admission: RE | Admit: 2017-01-12 | Discharge: 2017-01-12 | Disposition: A | Discharge status: Home / Self Care | Payer: Medicare Other | Source: Ambulatory Visit | Attending: Cardiology | Admitting: Cardiology

## 2017-01-12 ENCOUNTER — Encounter (HOSPITAL_COMMUNITY)
Admission: RE | Admit: 2017-01-12 | Discharge: 2017-01-12 | Disposition: A | Discharge status: Home / Self Care | Payer: Medicare Other | Source: Ambulatory Visit | Attending: Cardiology | Admitting: Cardiology

## 2017-01-12 DIAGNOSIS — I25119 Atherosclerotic heart disease of native coronary artery with unspecified angina pectoris: Secondary | ICD-10-CM

## 2017-01-12 DIAGNOSIS — I25118 Atherosclerotic heart disease of native coronary artery with other forms of angina pectoris: Secondary | ICD-10-CM | POA: Diagnosis not present

## 2017-01-12 DIAGNOSIS — R9439 Abnormal result of other cardiovascular function study: Secondary | ICD-10-CM | POA: Diagnosis not present

## 2017-01-12 DIAGNOSIS — I6523 Occlusion and stenosis of bilateral carotid arteries: Secondary | ICD-10-CM | POA: Diagnosis not present

## 2017-01-12 LAB — NM MYOCAR MULTI W/SPECT W/WALL MOTION / EF
CHL CUP NUCLEAR SRS: 3
LHR: 0.31
LV sys vol: 27 mL
LVDIAVOL: 71 mL (ref 46–106)
Peak HR: 64 {beats}/min
Rest HR: 48 {beats}/min
SDS: 1
SSS: 4
TID: 1.1

## 2017-01-12 MED ORDER — SODIUM CHLORIDE 0.9% FLUSH
INTRAVENOUS | Status: AC
  Administered 2017-01-12: 10 mL via INTRAVENOUS
  Filled 2017-01-12: qty 10

## 2017-01-12 MED ORDER — REGADENOSON 0.4 MG/5ML IV SOLN
INTRAVENOUS | Status: AC
  Administered 2017-01-12: 0.4 mg via INTRAVENOUS
  Filled 2017-01-12: qty 5

## 2017-01-12 MED ORDER — TECHNETIUM TC 99M TETROFOSMIN IV KIT
30.0000 | PACK | Freq: Once | INTRAVENOUS | Status: AC | PRN
  Administered 2017-01-12: 32 via INTRAVENOUS

## 2017-01-12 MED ORDER — TECHNETIUM TC 99M TETROFOSMIN IV KIT
10.0000 | PACK | Freq: Once | INTRAVENOUS | Status: AC | PRN
  Administered 2017-01-12: 10 via INTRAVENOUS

## 2017-01-16 DIAGNOSIS — I10 Essential (primary) hypertension: Secondary | ICD-10-CM | POA: Diagnosis not present

## 2017-01-16 DIAGNOSIS — F419 Anxiety disorder, unspecified: Secondary | ICD-10-CM | POA: Diagnosis not present

## 2017-01-16 DIAGNOSIS — F17200 Nicotine dependence, unspecified, uncomplicated: Secondary | ICD-10-CM | POA: Diagnosis not present

## 2017-01-16 DIAGNOSIS — M797 Fibromyalgia: Secondary | ICD-10-CM | POA: Diagnosis not present

## 2017-01-16 DIAGNOSIS — Z6834 Body mass index (BMI) 34.0-34.9, adult: Secondary | ICD-10-CM | POA: Diagnosis not present

## 2017-01-23 DIAGNOSIS — Z96651 Presence of right artificial knee joint: Secondary | ICD-10-CM | POA: Diagnosis not present

## 2017-01-23 DIAGNOSIS — M25561 Pain in right knee: Secondary | ICD-10-CM | POA: Diagnosis not present

## 2017-01-23 DIAGNOSIS — M5410 Radiculopathy, site unspecified: Secondary | ICD-10-CM | POA: Diagnosis not present

## 2017-01-29 ENCOUNTER — Encounter (HOSPITAL_COMMUNITY): Payer: Self-pay | Admitting: *Deleted

## 2017-01-29 MED ORDER — MUPIROCIN 2 % EX OINT: 1.0000 "application " | TOPICAL_OINTMENT | Freq: Once | CUTANEOUS | Status: DC

## 2017-01-30 ENCOUNTER — Inpatient Hospital Stay (HOSPITAL_COMMUNITY)
Admission: RE | Admit: 2017-01-30 | Discharge: 2017-02-01 | DRG: 460 | Disposition: A | Discharge status: Home / Self Care | Payer: Medicare Other | Source: Ambulatory Visit | Attending: Neurological Surgery | Admitting: Neurological Surgery

## 2017-01-30 ENCOUNTER — Inpatient Hospital Stay (HOSPITAL_COMMUNITY): Payer: Medicare Other | Admitting: Vascular Surgery

## 2017-01-30 ENCOUNTER — Inpatient Hospital Stay (HOSPITAL_COMMUNITY): Payer: Medicare Other | Admitting: Certified Registered Nurse Anesthetist

## 2017-01-30 ENCOUNTER — Inpatient Hospital Stay (HOSPITAL_COMMUNITY): Payer: Medicare Other

## 2017-01-30 ENCOUNTER — Encounter (HOSPITAL_COMMUNITY): Payer: Self-pay | Admitting: Anesthesiology

## 2017-01-30 ENCOUNTER — Inpatient Hospital Stay (HOSPITAL_COMMUNITY)
Admission: RE | Disposition: A | Discharge status: Home / Self Care | Payer: Self-pay | Source: Ambulatory Visit | Attending: Neurological Surgery

## 2017-01-30 DIAGNOSIS — Z419 Encounter for procedure for purposes other than remedying health state, unspecified: Secondary | ICD-10-CM

## 2017-01-30 DIAGNOSIS — I10 Essential (primary) hypertension: Secondary | ICD-10-CM | POA: Diagnosis not present

## 2017-01-30 DIAGNOSIS — M1990 Unspecified osteoarthritis, unspecified site: Secondary | ICD-10-CM | POA: Diagnosis not present

## 2017-01-30 DIAGNOSIS — M48062 Spinal stenosis, lumbar region with neurogenic claudication: Secondary | ICD-10-CM | POA: Diagnosis not present

## 2017-01-30 DIAGNOSIS — M4726 Other spondylosis with radiculopathy, lumbar region: Secondary | ICD-10-CM | POA: Diagnosis present

## 2017-01-30 DIAGNOSIS — M4316 Spondylolisthesis, lumbar region: Secondary | ICD-10-CM | POA: Diagnosis not present

## 2017-01-30 DIAGNOSIS — Z7982 Long term (current) use of aspirin: Secondary | ICD-10-CM

## 2017-01-30 DIAGNOSIS — M797 Fibromyalgia: Secondary | ICD-10-CM | POA: Diagnosis present

## 2017-01-30 DIAGNOSIS — Z96642 Presence of left artificial hip joint: Secondary | ICD-10-CM | POA: Diagnosis not present

## 2017-01-30 DIAGNOSIS — M25569 Pain in unspecified knee: Secondary | ICD-10-CM | POA: Diagnosis not present

## 2017-01-30 DIAGNOSIS — F17210 Nicotine dependence, cigarettes, uncomplicated: Secondary | ICD-10-CM | POA: Diagnosis present

## 2017-01-30 DIAGNOSIS — M5416 Radiculopathy, lumbar region: Secondary | ICD-10-CM | POA: Diagnosis not present

## 2017-01-30 DIAGNOSIS — M549 Dorsalgia, unspecified: Secondary | ICD-10-CM | POA: Diagnosis not present

## 2017-01-30 DIAGNOSIS — I25119 Atherosclerotic heart disease of native coronary artery with unspecified angina pectoris: Secondary | ICD-10-CM | POA: Diagnosis not present

## 2017-01-30 DIAGNOSIS — M4326 Fusion of spine, lumbar region: Secondary | ICD-10-CM | POA: Diagnosis not present

## 2017-01-30 LAB — BASIC METABOLIC PANEL
ANION GAP: 9 (ref 5–15)
BUN: 16 mg/dL (ref 6–20)
CO2: 28 mmol/L (ref 22–32)
Calcium: 9.4 mg/dL (ref 8.9–10.3)
Chloride: 100 mmol/L — ABNORMAL LOW (ref 101–111)
Creatinine, Ser: 0.84 mg/dL (ref 0.44–1.00)
Glucose, Bld: 95 mg/dL (ref 65–99)
POTASSIUM: 3.6 mmol/L (ref 3.5–5.1)
SODIUM: 137 mmol/L (ref 135–145)

## 2017-01-30 LAB — TYPE AND SCREEN
ABO/RH(D): A POS
Antibody Screen: NEGATIVE

## 2017-01-30 LAB — CBC
HEMATOCRIT: 40.8 % (ref 36.0–46.0)
Hemoglobin: 13.3 g/dL (ref 12.0–15.0)
MCH: 28.6 pg (ref 26.0–34.0)
MCHC: 32.6 g/dL (ref 30.0–36.0)
MCV: 87.7 fL (ref 78.0–100.0)
PLATELETS: 385 10*3/uL (ref 150–400)
RBC: 4.65 MIL/uL (ref 3.87–5.11)
RDW: 12.8 % (ref 11.5–15.5)
WBC: 9.2 10*3/uL (ref 4.0–10.5)

## 2017-01-30 SURGERY — POSTERIOR LUMBAR FUSION 2 LEVEL
Anesthesia: General | Site: Back

## 2017-01-30 MED ORDER — BUPIVACAINE HCL (PF) 0.5 % IJ SOLN
INTRAMUSCULAR | Status: DC | PRN
  Administered 2017-01-30: 25 mL
  Administered 2017-01-30: 5 mL

## 2017-01-30 MED ORDER — EPHEDRINE SULFATE 50 MG/ML IJ SOLN
INTRAMUSCULAR | Status: DC | PRN
  Administered 2017-01-30 (×6): 5 mg via INTRAVENOUS

## 2017-01-30 MED ORDER — THROMBIN 5000 UNITS EX SOLR
OROMUCOSAL | Status: DC | PRN
  Administered 2017-01-30 (×3): via TOPICAL

## 2017-01-30 MED ORDER — PROPOFOL 10 MG/ML IV BOLUS
INTRAVENOUS | Status: DC | PRN
Start: 2017-01-30 — End: 2017-01-30
  Administered 2017-01-30: 150 mg via INTRAVENOUS

## 2017-01-30 MED ORDER — CEFAZOLIN SODIUM-DEXTROSE 2-4 GM/100ML-% IV SOLN
INTRAVENOUS | Status: AC
  Filled 2017-01-30: qty 100

## 2017-01-30 MED ORDER — DEXAMETHASONE SODIUM PHOSPHATE 10 MG/ML IJ SOLN
INTRAMUSCULAR | Status: AC
  Filled 2017-01-30: qty 1

## 2017-01-30 MED ORDER — ONDANSETRON HCL 4 MG/2ML IJ SOLN
4.0000 mg | Freq: Four times a day (QID) | INTRAMUSCULAR | Status: DC | PRN
  Administered 2017-01-30: 4 mg via INTRAVENOUS
  Filled 2017-01-30: qty 2

## 2017-01-30 MED ORDER — METHOCARBAMOL 1000 MG/10ML IJ SOLN
500.0000 mg | Freq: Four times a day (QID) | INTRAVENOUS | Status: DC | PRN
  Filled 2017-01-30: qty 5

## 2017-01-30 MED ORDER — METHOCARBAMOL 500 MG PO TABS
500.0000 mg | ORAL_TABLET | Freq: Four times a day (QID) | ORAL | Status: DC | PRN
  Administered 2017-01-30 – 2017-02-01 (×5): 500 mg via ORAL
  Filled 2017-01-30 (×4): qty 1

## 2017-01-30 MED ORDER — THROMBIN 5000 UNITS EX SOLR
CUTANEOUS | Status: AC
  Filled 2017-01-30: qty 5000

## 2017-01-30 MED ORDER — CEFAZOLIN SODIUM-DEXTROSE 2-4 GM/100ML-% IV SOLN
2.0000 g | INTRAVENOUS | Status: AC
  Administered 2017-01-30 (×2): 2 g via INTRAVENOUS

## 2017-01-30 MED ORDER — ALBUMIN HUMAN 5 % IV SOLN
INTRAVENOUS | Status: DC | PRN
  Administered 2017-01-30 (×3): via INTRAVENOUS

## 2017-01-30 MED ORDER — PHENYLEPHRINE HCL 10 MG/ML IJ SOLN
INTRAVENOUS | Status: DC | PRN
  Administered 2017-01-30: 25 ug/min via INTRAVENOUS

## 2017-01-30 MED ORDER — ALPRAZOLAM 0.5 MG PO TABS
0.5000 mg | ORAL_TABLET | Freq: Three times a day (TID) | ORAL | Status: DC | PRN
  Administered 2017-01-31 – 2017-02-01 (×4): 0.5 mg via ORAL
  Filled 2017-01-30 (×4): qty 1

## 2017-01-30 MED ORDER — LACTATED RINGERS IV SOLN: INTRAVENOUS | Status: DC

## 2017-01-30 MED ORDER — PHENOL 1.4 % MT LIQD: 1.0000 | OROMUCOSAL | Status: DC | PRN

## 2017-01-30 MED ORDER — ROCURONIUM BROMIDE 50 MG/5ML IV SOLN
INTRAVENOUS | Status: AC
  Filled 2017-01-30: qty 1

## 2017-01-30 MED ORDER — LIDOCAINE-EPINEPHRINE 1 %-1:100000 IJ SOLN
INTRAMUSCULAR | Status: DC | PRN
  Administered 2017-01-30: 5 mL

## 2017-01-30 MED ORDER — DOCUSATE SODIUM 100 MG PO CAPS
100.0000 mg | ORAL_CAPSULE | Freq: Two times a day (BID) | ORAL | Status: DC
  Administered 2017-01-30 – 2017-02-01 (×4): 100 mg via ORAL
  Filled 2017-01-30 (×4): qty 1

## 2017-01-30 MED ORDER — DEXAMETHASONE SODIUM PHOSPHATE 10 MG/ML IJ SOLN
INTRAMUSCULAR | Status: DC | PRN
  Administered 2017-01-30: 10 mg via INTRAVENOUS

## 2017-01-30 MED ORDER — LIDOCAINE-EPINEPHRINE 1 %-1:100000 IJ SOLN
INTRAMUSCULAR | Status: AC
  Filled 2017-01-30: qty 1

## 2017-01-30 MED ORDER — ONDANSETRON HCL 4 MG/2ML IJ SOLN
INTRAMUSCULAR | Status: DC | PRN
  Administered 2017-01-30: 4 mg via INTRAVENOUS

## 2017-01-30 MED ORDER — ALUM & MAG HYDROXIDE-SIMETH 200-200-20 MG/5ML PO SUSP: 30.0000 mL | Freq: Four times a day (QID) | ORAL | Status: DC | PRN

## 2017-01-30 MED ORDER — METOPROLOL TARTRATE 100 MG PO TABS
100.0000 mg | ORAL_TABLET | Freq: Two times a day (BID) | ORAL | Status: DC
  Administered 2017-01-31 – 2017-02-01 (×3): 100 mg via ORAL
  Filled 2017-01-30 (×3): qty 1

## 2017-01-30 MED ORDER — PROMETHAZINE HCL 25 MG/ML IJ SOLN: 6.2500 mg | INTRAMUSCULAR | Status: DC | PRN

## 2017-01-30 MED ORDER — POLYETHYLENE GLYCOL 3350 17 G PO PACK: 17.0000 g | PACK | Freq: Every day | ORAL | Status: DC | PRN

## 2017-01-30 MED ORDER — SODIUM CHLORIDE 0.9% FLUSH: 3.0000 mL | INTRAVENOUS | Status: DC | PRN

## 2017-01-30 MED ORDER — LACTATED RINGERS IV SOLN
INTRAVENOUS | Status: DC
  Administered 2017-01-30 (×3): via INTRAVENOUS

## 2017-01-30 MED ORDER — LACTATED RINGERS IV SOLN
INTRAVENOUS | Status: DC | PRN
  Administered 2017-01-30: 16:00:00 via INTRAVENOUS

## 2017-01-30 MED ORDER — OXYCODONE HCL 5 MG PO TABS
5.0000 mg | ORAL_TABLET | Freq: Once | ORAL | Status: AC | PRN
  Administered 2017-01-30: 5 mg via ORAL

## 2017-01-30 MED ORDER — THROMBIN 20000 UNITS EX SOLR
CUTANEOUS | Status: DC | PRN
  Administered 2017-01-30: 14:00:00 via TOPICAL

## 2017-01-30 MED ORDER — BUPIVACAINE HCL (PF) 0.5 % IJ SOLN
INTRAMUSCULAR | Status: AC
  Filled 2017-01-30: qty 30

## 2017-01-30 MED ORDER — BACITRACIN 50000 UNITS IM SOLR
INTRAMUSCULAR | Status: DC | PRN
  Administered 2017-01-30: 14:00:00

## 2017-01-30 MED ORDER — SENNA 8.6 MG PO TABS
1.0000 | ORAL_TABLET | Freq: Two times a day (BID) | ORAL | Status: DC
  Administered 2017-01-30 – 2017-02-01 (×3): 8.6 mg via ORAL
  Filled 2017-01-30 (×4): qty 1

## 2017-01-30 MED ORDER — THROMBIN 20000 UNITS EX SOLR
CUTANEOUS | Status: AC
  Filled 2017-01-30: qty 20000

## 2017-01-30 MED ORDER — AMLODIPINE BESYLATE 10 MG PO TABS
10.0000 mg | ORAL_TABLET | Freq: Every day | ORAL | Status: DC
  Administered 2017-01-31 – 2017-02-01 (×2): 10 mg via ORAL
  Filled 2017-01-30 (×2): qty 1

## 2017-01-30 MED ORDER — FUROSEMIDE 40 MG PO TABS
40.0000 mg | ORAL_TABLET | Freq: Every day | ORAL | Status: DC
  Administered 2017-01-31 – 2017-02-01 (×2): 40 mg via ORAL
  Filled 2017-01-30 (×2): qty 1

## 2017-01-30 MED ORDER — BISACODYL 10 MG RE SUPP: 10.0000 mg | Freq: Every day | RECTAL | Status: DC | PRN

## 2017-01-30 MED ORDER — SODIUM CHLORIDE 0.9 % IV SOLN: 250.0000 mL | INTRAVENOUS | Status: DC

## 2017-01-30 MED ORDER — ATORVASTATIN CALCIUM 20 MG PO TABS
10.0000 mg | ORAL_TABLET | Freq: Every day | ORAL | Status: DC
  Administered 2017-01-30 – 2017-01-31 (×2): 10 mg via ORAL
  Filled 2017-01-30 (×2): qty 1

## 2017-01-30 MED ORDER — LISINOPRIL-HYDROCHLOROTHIAZIDE 10-12.5 MG PO TABS: 1.0000 | ORAL_TABLET | Freq: Every day | ORAL | Status: DC

## 2017-01-30 MED ORDER — 0.9 % SODIUM CHLORIDE (POUR BTL) OPTIME
TOPICAL | Status: DC | PRN
  Administered 2017-01-30: 1000 mL

## 2017-01-30 MED ORDER — FENTANYL CITRATE (PF) 100 MCG/2ML IJ SOLN
INTRAMUSCULAR | Status: DC | PRN
  Administered 2017-01-30 (×5): 50 ug via INTRAVENOUS

## 2017-01-30 MED ORDER — SODIUM CHLORIDE 0.9% FLUSH
3.0000 mL | Freq: Two times a day (BID) | INTRAVENOUS | Status: DC
  Administered 2017-01-30 – 2017-01-31 (×2): 3 mL via INTRAVENOUS

## 2017-01-30 MED ORDER — OXYCODONE HCL 5 MG/5ML PO SOLN: 5.0000 mg | Freq: Once | ORAL | Status: AC | PRN

## 2017-01-30 MED ORDER — CHLORHEXIDINE GLUCONATE CLOTH 2 % EX PADS: 6.0000 | MEDICATED_PAD | Freq: Once | CUTANEOUS | Status: DC

## 2017-01-30 MED ORDER — ACETAMINOPHEN 325 MG PO TABS
650.0000 mg | ORAL_TABLET | ORAL | Status: DC | PRN
  Administered 2017-01-30 – 2017-01-31 (×2): 650 mg via ORAL
  Filled 2017-01-30 (×2): qty 2

## 2017-01-30 MED ORDER — HYDROCODONE-ACETAMINOPHEN 5-325 MG PO TABS
1.0000 | ORAL_TABLET | ORAL | Status: DC | PRN
  Administered 2017-01-31 – 2017-02-01 (×5): 2 via ORAL
  Filled 2017-01-30 (×5): qty 2

## 2017-01-30 MED ORDER — HYDROCHLOROTHIAZIDE 12.5 MG PO CAPS
12.5000 mg | ORAL_CAPSULE | Freq: Every day | ORAL | Status: DC
  Administered 2017-01-31 – 2017-02-01 (×2): 12.5 mg via ORAL
  Filled 2017-01-30 (×2): qty 1

## 2017-01-30 MED ORDER — MENTHOL 3 MG MT LOZG: 1.0000 | LOZENGE | OROMUCOSAL | Status: DC | PRN

## 2017-01-30 MED ORDER — TRAMADOL HCL 50 MG PO TABS
100.0000 mg | ORAL_TABLET | Freq: Four times a day (QID) | ORAL | Status: DC | PRN
  Administered 2017-01-30 – 2017-01-31 (×2): 100 mg via ORAL
  Filled 2017-01-30 (×2): qty 2

## 2017-01-30 MED ORDER — GLYCOPYRROLATE 0.2 MG/ML IJ SOLN
INTRAMUSCULAR | Status: DC | PRN
  Administered 2017-01-30 (×2): 0.1 mg via INTRAVENOUS

## 2017-01-30 MED ORDER — MIDAZOLAM HCL 5 MG/5ML IJ SOLN
INTRAMUSCULAR | Status: DC | PRN
  Administered 2017-01-30: 2 mg via INTRAVENOUS

## 2017-01-30 MED ORDER — GABAPENTIN 400 MG PO CAPS
800.0000 mg | ORAL_CAPSULE | Freq: Two times a day (BID) | ORAL | Status: DC
  Administered 2017-01-30 – 2017-02-01 (×4): 800 mg via ORAL
  Filled 2017-01-30 (×4): qty 2

## 2017-01-30 MED ORDER — ACETAMINOPHEN 650 MG RE SUPP: 650.0000 mg | RECTAL | Status: DC | PRN

## 2017-01-30 MED ORDER — ONDANSETRON HCL 4 MG PO TABS
4.0000 mg | ORAL_TABLET | Freq: Four times a day (QID) | ORAL | Status: DC | PRN
  Administered 2017-01-31: 4 mg via ORAL
  Filled 2017-01-30: qty 1

## 2017-01-30 MED ORDER — ROCURONIUM BROMIDE 100 MG/10ML IV SOLN
INTRAVENOUS | Status: DC | PRN
  Administered 2017-01-30: 50 mg via INTRAVENOUS
  Administered 2017-01-30: 10 mg via INTRAVENOUS

## 2017-01-30 MED ORDER — HYDROMORPHONE HCL 1 MG/ML IJ SOLN: 1.0000 mg | INTRAMUSCULAR | Status: DC | PRN

## 2017-01-30 MED ORDER — LISINOPRIL 20 MG PO TABS
10.0000 mg | ORAL_TABLET | Freq: Every day | ORAL | Status: DC
  Administered 2017-01-31 – 2017-02-01 (×2): 10 mg via ORAL
  Filled 2017-01-30 (×2): qty 1

## 2017-01-30 MED ORDER — HYDROMORPHONE HCL 1 MG/ML IJ SOLN
0.2500 mg | INTRAMUSCULAR | Status: DC | PRN
  Administered 2017-01-30 (×3): 0.5 mg via INTRAVENOUS

## 2017-01-30 MED ORDER — LIDOCAINE HCL (CARDIAC) 20 MG/ML IV SOLN
INTRAVENOUS | Status: DC | PRN
  Administered 2017-01-30: 60 mg via INTRAVENOUS

## 2017-01-30 MED ORDER — KETOROLAC TROMETHAMINE 15 MG/ML IJ SOLN
7.5000 mg | Freq: Four times a day (QID) | INTRAMUSCULAR | Status: AC
  Administered 2017-01-30 – 2017-01-31 (×3): 7.5 mg via INTRAVENOUS
  Filled 2017-01-30 (×2): qty 1

## 2017-01-30 MED ORDER — FLEET ENEMA 7-19 GM/118ML RE ENEM: 1.0000 | ENEMA | Freq: Once | RECTAL | Status: DC | PRN

## 2017-01-30 SURGICAL SUPPLY — 73 items
ADH SKN CLS APL DERMABOND .7 (GAUZE/BANDAGES/DRESSINGS) ×1
APL SRG 60D 8 XTD TIP BNDBL (TIP)
BAG DECANTER FOR FLEXI CONT (MISCELLANEOUS) ×3 IMPLANT
BASKET BONE COLLECTION (BASKET) ×3 IMPLANT
BLADE CLIPPER SURG (BLADE) IMPLANT
BONE CANC CHIPS 20CC PCAN1/4 (Bone Implant) ×3 IMPLANT
BUR MATCHSTICK NEURO 3.0 LAGG (BURR) ×3 IMPLANT
CANISTER SUCT 3000ML PPV (MISCELLANEOUS) ×3 IMPLANT
CARTRIDGE OIL MAESTRO DRILL (MISCELLANEOUS) ×1 IMPLANT
CHIPS CANC BONE 20CC PCAN1/4 (Bone Implant) ×1 IMPLANT
CONT SPEC 4OZ CLIKSEAL STRL BL (MISCELLANEOUS) ×3 IMPLANT
COVER BACK TABLE 60X90IN (DRAPES) ×3 IMPLANT
DECANTER SPIKE VIAL GLASS SM (MISCELLANEOUS) ×3 IMPLANT
DERMABOND ADVANCED (GAUZE/BANDAGES/DRESSINGS) ×2
DERMABOND ADVANCED .7 DNX12 (GAUZE/BANDAGES/DRESSINGS) ×1 IMPLANT
DEVICE DISSECT PLASMABLAD 3.0S (MISCELLANEOUS) ×1 IMPLANT
DIFFUSER DRILL AIR PNEUMATIC (MISCELLANEOUS) ×3 IMPLANT
DRAPE C-ARM 42X72 X-RAY (DRAPES) ×6 IMPLANT
DRAPE HALF SHEET 40X57 (DRAPES) IMPLANT
DRAPE LAPAROTOMY 100X72X124 (DRAPES) ×3 IMPLANT
DRAPE POUCH INSTRU U-SHP 10X18 (DRAPES) ×3 IMPLANT
DRSG OPSITE POSTOP 4X8 (GAUZE/BANDAGES/DRESSINGS) ×2 IMPLANT
DURAPREP 26ML APPLICATOR (WOUND CARE) ×3 IMPLANT
DURASEAL APPLICATOR TIP (TIP) IMPLANT
DURASEAL SPINE SEALANT 3ML (MISCELLANEOUS) IMPLANT
ELECT REM PT RETURN 9FT ADLT (ELECTROSURGICAL) ×3
ELECTRODE REM PT RTRN 9FT ADLT (ELECTROSURGICAL) ×1 IMPLANT
GAUZE SPONGE 4X4 12PLY STRL (GAUZE/BANDAGES/DRESSINGS) ×3 IMPLANT
GAUZE SPONGE 4X4 16PLY XRAY LF (GAUZE/BANDAGES/DRESSINGS) IMPLANT
GLOVE BIOGEL PI IND STRL 7.5 (GLOVE) IMPLANT
GLOVE BIOGEL PI IND STRL 8.5 (GLOVE) ×2 IMPLANT
GLOVE BIOGEL PI INDICATOR 7.5 (GLOVE) ×2
GLOVE BIOGEL PI INDICATOR 8.5 (GLOVE) ×6
GLOVE ECLIPSE 7.0 STRL STRAW (GLOVE) ×2 IMPLANT
GLOVE ECLIPSE 8.5 STRL (GLOVE) ×8 IMPLANT
GOWN STRL REUS W/ TWL LRG LVL3 (GOWN DISPOSABLE) IMPLANT
GOWN STRL REUS W/ TWL XL LVL3 (GOWN DISPOSABLE) IMPLANT
GOWN STRL REUS W/TWL 2XL LVL3 (GOWN DISPOSABLE) ×6 IMPLANT
GOWN STRL REUS W/TWL LRG LVL3 (GOWN DISPOSABLE) ×6
GOWN STRL REUS W/TWL XL LVL3 (GOWN DISPOSABLE) ×3
GRAFT BNE CANC CHIPS 1-8 20CC (Bone Implant) IMPLANT
HEMOSTAT POWDER KIT SURGIFOAM (HEMOSTASIS) ×6 IMPLANT
KIT BASIN OR (CUSTOM PROCEDURE TRAY) ×3 IMPLANT
KIT INFUSE MEDIUM (Orthopedic Implant) ×2 IMPLANT
KIT ROOM TURNOVER OR (KITS) ×3 IMPLANT
NDL SPNL 18GX3.5 QUINCKE PK (NEEDLE) IMPLANT
NEEDLE HYPO 22GX1.5 SAFETY (NEEDLE) ×3 IMPLANT
NEEDLE SPNL 18GX3.5 QUINCKE PK (NEEDLE) IMPLANT
NS IRRIG 1000ML POUR BTL (IV SOLUTION) ×3 IMPLANT
OIL CARTRIDGE MAESTRO DRILL (MISCELLANEOUS) ×3
PACK LAMINECTOMY NEURO (CUSTOM PROCEDURE TRAY) ×3 IMPLANT
PAD ARMBOARD 7.5X6 YLW CONV (MISCELLANEOUS) ×9 IMPLANT
PATTIES SURGICAL .5 X.5 (GAUZE/BANDAGES/DRESSINGS) ×2 IMPLANT
PATTIES SURGICAL .5 X1 (DISPOSABLE) ×3 IMPLANT
PLASMABLADE 3.0S (MISCELLANEOUS) ×3
ROD TI ALLOY CVD VIT 5.5X60MM (Rod) ×4 IMPLANT
SCREW VITALITY PA 6.5X45MM (Screw) ×12 IMPLANT
SPACER ZYSTON STR 10X25X10X8 (Spacer) ×4 IMPLANT
SPACER ZYSTON STR 11X25X10X8 (Spacer) ×4 IMPLANT
SPONGE LAP 4X18 X RAY DECT (DISPOSABLE) IMPLANT
SPONGE SURGIFOAM ABS GEL 100 (HEMOSTASIS) ×3 IMPLANT
SUT PROLENE 6 0 BV (SUTURE) ×2 IMPLANT
SUT VIC AB 1 CT1 18XBRD ANBCTR (SUTURE) ×1 IMPLANT
SUT VIC AB 1 CT1 8-18 (SUTURE) ×6
SUT VIC AB 2-0 CP2 18 (SUTURE) ×5 IMPLANT
SUT VIC AB 3-0 SH 8-18 (SUTURE) ×3 IMPLANT
SYR 3ML LL SCALE MARK (SYRINGE) ×12 IMPLANT
SYR 5ML LL (SYRINGE) IMPLANT
TOP CLOSURE 5.5-6.0 SHEAR TOP (Neuro Prosthesis/Implant) ×12 IMPLANT
TOWEL GREEN STERILE (TOWEL DISPOSABLE) ×3 IMPLANT
TOWEL GREEN STERILE FF (TOWEL DISPOSABLE) ×3 IMPLANT
TRAY FOLEY W/METER SILVER 16FR (SET/KITS/TRAYS/PACK) ×3 IMPLANT
WATER STERILE IRR 1000ML POUR (IV SOLUTION) ×3 IMPLANT

## 2017-01-30 NOTE — Anesthesia Postprocedure Evaluation (Signed)
Anesthesia Post Note  Patient: [name]  Procedure(s) Performed: Procedure(s) (LRB): Lumbar three- four Lumbar four- five Posterior lumbar interbody fusion (N/A)     Patient location during evaluation: PACU Anesthesia Type: General Level of consciousness: awake, awake and alert and oriented Pain management: pain level controlled Vital Signs Assessment: post-procedure vital signs reviewed and stable Respiratory status: spontaneous breathing, nonlabored ventilation and respiratory function stable Cardiovascular status: blood pressure returned to baseline Anesthetic complications: no    Last Vitals:  Vitals:   01/30/17 1816 01/30/17 1849  BP:  (!) 111/37  Pulse:  (!) 59  Resp: 14 15  Temp: 36.8 C     Last Pain:  Vitals:   01/30/17 1849  TempSrc:   PainSc: 8                  Jaquia Benedicto COKER

## 2017-01-30 NOTE — Anesthesia Preprocedure Evaluation (Addendum)
Anesthesia Evaluation  Patient identified by MRN, date of birth, ID band Patient awake    Reviewed: Allergy & Precautions, NPO status , Patient's Chart, lab work & pertinent test results, reviewed documented beta blocker date and time   Airway Mallampati: II  TM Distance: >3 FB Neck ROM: Full    Dental no notable dental hx. (+) Lower Dentures, Partial Upper, Dental Advisory Given   Pulmonary Current Smoker,    Pulmonary exam normal breath sounds clear to auscultation       Cardiovascular hypertension, Pt. on home beta blockers and Pt. on medications + CAD and + Cardiac Stents (x 1. Placed in 2004)  Normal cardiovascular exam Rhythm:Regular Rate:Bradycardia  ECG: SB, rate 49  ECHO:04/27/15 [facility]):  1. The study quality is fair. 2. LV chamber size is normal. Global LV wall motion and contractility are within normal limits. Estimated EF 60-65%. 3. No old study for comparison.   Sees cardiologist Domenic Polite)  There was no ST segment deviation noted during stress. Findings consistent with prior antoerlateral myocardial infarction with mild peri-infarct ischemia. This is a low risk study. The left ventricular ejection fraction is normal (55-65%).   Neuro/Psych Anxiety Depression negative neurological ROS     GI/Hepatic negative GI ROS, Neg liver ROS,   Endo/Other  negative endocrine ROS  Renal/GU negative Renal ROS  negative genitourinary   Musculoskeletal  (+) Fibromyalgia -  Abdominal (+) + obese,   Peds negative pediatric ROS (+)  Hematology negative hematology ROS (+)   Anesthesia Other Findings Hyperlipidemia  Obese  Negative myocardial perfusion scan at [facility]  Reproductive/Obstetrics negative OB ROS                          Anesthesia Physical Anesthesia Plan  ASA: III  Anesthesia Plan: General   Post-op Pain Management:    Induction: Intravenous  PONV Risk  Score and Plan: 3 and Ondansetron, Dexamethasone, Propofol and Midazolam  Airway Management Planned: Oral ETT  Additional Equipment:   Intra-op Plan:   Post-operative Plan: Extubation in OR  Informed Consent: I have reviewed the patients History and Physical, chart, labs and discussed the procedure including the risks, benefits and alternatives for the proposed anesthesia with the patient or authorized representative who has indicated his/her understanding and acceptance.   Dental advisory given  Plan Discussed with: CRNA  Anesthesia Plan Comments:         Anesthesia Quick Evaluation

## 2017-01-30 NOTE — Transfer of Care (Signed)
Immediate Anesthesia Transfer of Care Note  Patient: KHYLEI WILMS  Procedure(s) Performed: Procedure(s) with comments: Lumbar three- four Lumbar four- five Posterior lumbar interbody fusion (N/A) - L3-4 L4-5 Posterior lumbar interbody fusion  Patient Location: PACU  Anesthesia Type:General  Level of Consciousness: awake, alert  and oriented  Airway & Oxygen Therapy: Patient Spontanous Breathing and Patient connected to nasal cannula oxygen  Post-op Assessment: Report given to RN, Post -op Vital signs reviewed and stable and Patient moving all extremities  Post vital signs: Reviewed and stable  Last Vitals:  Vitals:   01/30/17 0847 01/30/17 1816  BP: (!) 122/51   Pulse: 94   Resp: 20 14  Temp: 36.8 C 36.8 C    Last Pain:  Vitals:   01/30/17 0847  TempSrc: Oral         Complications: No apparent anesthesia complications

## 2017-01-30 NOTE — Op Note (Addendum)
Date of surgery: 01/30/2017 Preoperative diagnosis: Spondylolisthesis L3-4 L4-5 with stenosis, neurogenic claudication, lumbar radiculopathy Postoperative diagnosis: Same Procedure: Laminectomy L3-4 and L4-5 with decompression of the L3-L4 and L5 nerve roots, posterior lumbar interbody arthrodesis with peek spacers local autograft and allograft L3-4 L4-5. Segmental pedicle screw fixation L3-L5 with posterior lateral arthrodesis using local autograft and allograft. Surgeon: Kristeen Miss First assistant: Ashley Jacobs M.D. Anesthesia: Gen. endotracheal Indications: [name] 66 year old individual who's had significant back and bilateral lower extremity pain. Over the past 3 months she is required to use a wheelchair as her legs are so weakened that she has difficulty even with transfers. She was evaluated in the office with an MRI that demonstrates presence of advanced spondylolisthesis at L4-5 mild spondylosis with stenosis at L3-4. Noting that she had significant leg weakness already she was advised regarding the need for surgical decompression and stabilization from L3-L5.  Procedure: The patient was brought to the operating room supine on a stretcher. After the smooth induction of general endotracheal anesthesia, she was turned prone. The back was prepped with alcohol DuraPrep and draped in a sterile fashion. Midline incision was created and carried down to the lumbar dorsal fascia. First spinous processes were noted to be that of L3 and L4. The dissection was then carried out over the transverse processes of L3-4 and L4-5 and the facets at L3-4 and L4-5 where he didn't find isolated the transverse spaces were decorticated and packed off for later use in grafting. Then laminectomy was created removing the inferior marginal lamina of L3 out to and including the entirety of the facet at L3-L4. A total laminectomy of L4 was created removing a markedly hypertrophied laminar arch and facet complex on both  sides. This left her good relief of the stenosis that was noted to be underneath. During the dissection was a singular small dural rent which was oversewn with a singular 6-0 Prolene suture. The dissection was then continued isolating the L3 nerve roots and decompressing them using accommodation of curettes rongeurs and 2 mm Kerrison punches. The decompression was taken out into the foraminal region. The disc spaces were then isolated L3-4 and L4-5. With this the common dural tube could be mobilized and total discectomy was performed first at L4-5 removing substantially degenerated and desiccated disc from the interspace. The total discectomy was performed bilaterally and the endplates were decorticated. Once the disc space was prepared and in her body spacer was chosen is felt that a 11 mm tall 8 lordotic spacer measuring 25 mm in length with allow for good reduction of the spondylolisthesis and also opening of the foraminal zones. With this 3 strips of infuse was placed into the interspace along with the 2 cages and 9 mL of autograft allograft mixture. Attention was then turned to L3 for worse similar discectomy was created here a 10 mm tall 8 lordotic spacer fit best into the interspace allowing for good reduction. Again 3 separate strips of infuse were placed into the interspace with a total of 6 mL of bone graft. The intertransverse spaces were then packed with strips of infuse and the remainder of the autograft allograft mixture. Pedicle entry sites were then chosen at L3-L4 and L5 and under fluoroscopic guidance 6.5 x 45 mm screws were placed through the pedicles. 65 mm precontoured rods were then used to connect the screws together in a neutral construct. Final radiographs were obtained in AP and lateral projection. Hemostasis in the soft tissues was obtained meticulously the dural closure was  noted to be intact with no further leakage even under Valsalva and with this then the lumbar dorsal fascia was  reapproximated with #1 Vicryl interrupted fashion 2-0 Vicryl was used in subcutaneous anus tissues and 3-0 Vicryl subcuticular. Blood loss was estimated 3 and 50 mL and no Cell Saver blood was returned to the patient.

## 2017-01-30 NOTE — H&P (Signed)
CHIEF COMPLAINT: Pain in the right groin, right leg and lower back.  HISTORY OF PRESENT ILLNESS: Ethelmae is a 66 year old right-handed individual who tells me that she has been having some problems significantly in her back since January of 2018. She notes that she has had pain in the region of the right groin and the right hip, and initially thought it was related to the hip such that she had a series of hip x-rays. She has a total hip arthroplasty on the left side which has done well, but this pain was similar yet different. The pain would radiate into the groin and into the right lower extremity, and she also had a knee replacement on the right side some years ago. The pain has been unrelenting, and ultimately an MRI of the lumbar spine was completed after a hip injection on the right side gave her no relief. This MRI demonstrated that she has a high-grade stenosis at L4-L5, and she also has significant spondylitic stenosis at L3-L4. She is referred here for further evaluation and treatment of this problem.  PAST MEDICAL HISTORY: Reveals that she has some hypertension. She also tells me she has some fibromyalgia.  CURRENT MEDICATIONS: Include Neurontin, Xanax, Metoprolol, Amlodipine, Lisinopril, Lasix, Lipitor, Tramadol, and Aspirin.  ALLERGIES: Contrast, Niacin and Chantix. She has used Chantix to try to stop smoking, which she has done for the past number of years, but she smokes about a pack cigarettes a day.  OTHER SURGERIES: Include rotator cuff and carpal tunnel surgery, hysterectomy, and then she had a tubal ligation. Also had a cholecystectomy.  SYSTEMS REVIEW: Notable for sinus problems with back pain, arm pain, leg pain, joint pain and swelling, swelling in the feet and hands, leg pain while walking, and some chronic nausea.  PHYSICAL EXAMINATION: At this time reveals that she stands straight and erect but she tends to favor a slight forward stoop. She has motor strength that  demonstrates that her iliopsoas and quads have 4+/5 strength, but her tibialis anterior and gastrocs have 4-/5 strength with the right side being particularly weaker than the left. Deep tendon reflexes are absent in the patellae and the Achilles both.   Today in the office, to further her workup I obtained a lateral flexion-extension film of the lumbar spine along with a singular AP view, which demonstrates the spondylolisthesis at L4-5 of approximately 6 mm. This reduces to about 4 mm in extension. The alignment of her spine otherwise appears to be quite stable, and she has good alignment in the coronal view. I have advised at this time that ultimately Heddy will require surgical decompression and stabilization from L3-L5. This will stabilize and reduce the spondylolisthesis at L4-L5, but also decompress her spinal canal and the nerve roots going down into either lower extremity. This is a substantial operation. I noted the biggest concerns I have are her risk factors for surgery, which include her history of smoking and age over 51. These 2 things not withstanding, I believe that surgical decompression and stabilization should give her relief of the worst of that right buttock and hip pain. I am also hopeful that it will help to restore some of the strength in her lower extremities. The surgery typically requires about 4 hours to do. Most patients are typically in the hospital between 3 and 5 days. After surgery, she will be required to wear a corset-type brace for a period of about 6-8 weeks. Thereafter, her recuperation takes about 4-6 months total. She should be  up and about during that time and will hopefully have her weaned away from pain medication after about the first 3-4 weeks from surgery. All these things being discussed, we will plan on scheduling the surgery at her earliest convenience.

## 2017-01-30 NOTE — Anesthesia Procedure Notes (Signed)
Procedure Name: Intubation Date/Time: 01/30/2017 12:54 PM Performed by: Jenne Campus Pre-anesthesia Checklist: Patient identified, Emergency Drugs available, Suction available and Patient being monitored Patient Re-evaluated:Patient Re-evaluated prior to inductionOxygen Delivery Method: Circle System Utilized Preoxygenation: Pre-oxygenation with 100% oxygen Intubation Type: IV induction Ventilation: Mask ventilation without difficulty Laryngoscope Size: Miller and 2 Grade View: Grade I Tube type: Oral Tube size: 7.5 mm Number of attempts: 1 Airway Equipment and Method: Stylet and Oral airway Placement Confirmation: ETT inserted through vocal cords under direct vision,  positive ETCO2 and breath sounds checked- equal and bilateral Secured at: 21 cm Tube secured with: Tape Dental Injury: Teeth and Oropharynx as per pre-operative assessment

## 2017-01-31 LAB — BASIC METABOLIC PANEL
ANION GAP: 9 (ref 5–15)
BUN: 14 mg/dL (ref 6–20)
CALCIUM: 8.7 mg/dL — AB (ref 8.9–10.3)
CO2: 24 mmol/L (ref 22–32)
CREATININE: 0.89 mg/dL (ref 0.44–1.00)
Chloride: 100 mmol/L — ABNORMAL LOW (ref 101–111)
GFR calc Af Amer: >60 mL/min (ref >60)
GLUCOSE: 214 mg/dL — AB (ref 65–99)
Potassium: 3.6 mmol/L (ref 3.5–5.1)
Sodium: 133 mmol/L — ABNORMAL LOW (ref 135–145)

## 2017-01-31 LAB — CBC
HCT: 29.3 % — ABNORMAL LOW (ref 36.0–46.0)
Hemoglobin: 9.8 g/dL — ABNORMAL LOW (ref 12.0–15.0)
MCH: 29.4 pg (ref 26.0–34.0)
MCHC: 33.4 g/dL (ref 30.0–36.0)
MCV: 88 fL (ref 78.0–100.0)
Platelets: 248 K/uL (ref 150–400)
RBC: 3.33 MIL/uL — ABNORMAL LOW (ref 3.87–5.11)
RDW: 12.8 % (ref 11.5–15.5)
WBC: 12 K/uL — ABNORMAL HIGH (ref 4.0–10.5)

## 2017-01-31 MED FILL — Sodium Chloride IV Soln 0.9%: INTRAVENOUS | Qty: 1000 | Status: AC

## 2017-01-31 MED FILL — Thrombin For Soln 5000 Unit: CUTANEOUS | Qty: 2 | Status: AC

## 2017-01-31 MED FILL — Gelatin Absorbable MT Powder: OROMUCOSAL | Qty: 2 | Status: AC

## 2017-01-31 MED FILL — Heparin Sodium (Porcine) Inj 1000 Unit/ML: INTRAMUSCULAR | Qty: 30 | Status: AC

## 2017-01-31 NOTE — Progress Notes (Signed)
Physical Therapy Treatment Patient Details Name: [name] MRN: [id number] DOB: 08/13/1950 Today's Date: 01/31/2017    History of Present Illness Pt is a 66 y/o female s/p L3-L5 PLIF on 01/30/17.    PT Comments    Pt seen for a second session today with the goal of stair training. Pt reports earlier instance of dizziness accompanied by tremors and increased BP. Pt does not endorse any dizziness, and vitals stable throughout session. Pt was not able to progress to stair training, however, 2 fear of becoming dizzy on the stairs. Session instead focused on education for pt/family (mother and son present) on bed mobility, safety with transfers, and negotiating the home environment with RW. Will continue to follow and progress as able per POC.   Follow Up Recommendations  No PT follow up;Supervision for mobility/OOB     Equipment Recommendations  None recommended by PT    Recommendations for Other Services       Precautions / Restrictions Precautions Precautions: Fall;Back Precaution Booklet Issued: Yes (comment) Precaution Comments: revied precautions Required Braces or Orthoses: Spinal Brace Spinal Brace: Lumbar corset;Applied in sitting position Restrictions Weight Bearing Restrictions: No    Mobility  Bed Mobility Overal bed mobility: Needs Assistance Bed Mobility: Rolling;Sidelying to Sit;Sit to Sidelying Rolling: Modified independent (Device/Increase time) Sidelying to sit: Supervision       General bed mobility comments: HOB flat and rails lowered to simulate home environment. VC's for proper log roll technique. Bed height was raised to ensure pt could enter/exit her tall bed at home.   Transfers Overall transfer level: Needs assistance Equipment used: Rolling walker (2 wheeled) Transfers: Sit to/from Stand Sit to Stand: Supervision         General transfer comment: Hands on guarding for safety as pt powered up to full standing position.    Ambulation/Gait Ambulation/Gait assistance: Min guard;Supervision Ambulation Distance (Feet): 200 Feet Assistive device: Rolling walker (2 wheeled) Gait Pattern/deviations: Step-through pattern;Decreased stride length;Trunk flexed Gait velocity: Decreased Gait velocity interpretation: Below normal speed for age/gender General Gait Details: VC's for improved posture, bilateral shoulder relaxation/depression during walker use, and cues for standing rest breaks for energy conservation. Pt reports no dizziness throughout gait training. Vitals stable upon return to room.    Stairs            Wheelchair Mobility    Modified Rankin (Stroke Patients Only)       Balance Overall balance assessment: Needs assistance Sitting-balance support: Feet supported;No upper extremity supported Sitting balance-Leahy Scale: Fair     Standing balance support: Bilateral upper extremity supported;During functional activity Standing balance-Leahy Scale: Fair Standing balance comment: able to release and brush teeth                            Cognition Arousal/Alertness: Awake/alert Behavior During Therapy: WFL for tasks assessed/performed Overall Cognitive Status: Within Functional Limits for tasks assessed                                        Exercises      General Comments General comments (skin integrity, edema, etc.): dress dry and intact      Pertinent Vitals/Pain Pain Assessment: Faces Faces Pain Scale: Hurts even more Pain Location: head Pain Descriptors / Indicators: Headache Pain Intervention(s): Limited activity within patient's tolerance;Monitored during session;Repositioned    Home Living Family/patient  expects to be discharged to:: Private residence Living Arrangements: Spouse/significant other;Parent Available Help at Discharge: Family;Available 24 hours/day Type of Home: House Home Access: Stairs to enter Entrance Stairs-Rails:  Right Home Layout: Two level Home Equipment: Walker - 2 wheels;Cane - single point;Tub bench;Bedside commode Additional Comments: mother granddaughter and great granddaughter in addition to spouse are availabel to (A)    Prior Function Level of Independence: Independent with assistive device(s)      Comments: Pt reports using the walker PTA due to pain   PT Goals (current goals can now be found in the care plan section) Acute Rehab PT Goals Patient Stated Goal: home PT Goal Formulation: With patient Time For Goal Achievement: 02/07/17 Potential to Achieve Goals: Good Progress towards PT goals: Progressing toward goals    Frequency    Min 5X/week      PT Plan Current plan remains appropriate    Co-evaluation              AM-PAC PT "6 Clicks" Daily Activity  Outcome Measure  Difficulty turning over in bed (including adjusting bedclothes, sheets and blankets)?: None Difficulty moving from lying on back to sitting on the side of the bed? : A Little Difficulty sitting down on and standing up from a chair with arms (e.g., wheelchair, bedside commode, etc,.)?: A Little Help needed moving to and from a bed to chair (including a wheelchair)?: A Little Help needed walking in hospital room?: A Little Help needed climbing 3-5 steps with a railing? : A Little 6 Click Score: 19    End of Session Equipment Utilized During Treatment: Back brace Activity Tolerance: Patient tolerated treatment well Patient left: in chair;with call bell/phone within reach Nurse Communication: Mobility status PT Visit Diagnosis: Unsteadiness on feet (R26.81);Other symptoms and signs involving the nervous system (R29.898)     Time: 3212-2482 PT Time Calculation (min) (ACUTE ONLY): 39 min  Charges:  $Gait Training: 38-52 mins                    G Codes:       Rolinda Roan, PT, DPT Acute Rehabilitation Services Pager: [phone]    Thelma Comp 01/31/2017, 3:02 PM

## 2017-01-31 NOTE — Progress Notes (Signed)
Patient ID: [name], female   DOB: 04-16-1951, 66 y.o.   MRN: [id number] Vital signs are stable Patient's postoperative hemoglobin is 9.8 She did note some lightheadedness this afternoon We'll continue to observe clinically Incision is clean and dry Motor function appears stable

## 2017-01-31 NOTE — Evaluation (Signed)
Occupational Therapy Evaluation Patient Details Name: [name] MRN: [id number] DOB: [birth year] Today's Date: 01/31/2017    History of Present Illness Pt is a 66 y/o female s/p L3-L5 PLIF on 01/30/17.   Clinical Impression   Patient evaluated by Occupational Therapy with no further acute OT needs identified. All education has been completed and the patient has no further questions. See below for any follow-up Occupational Therapy or equipment needs. OT to sign off. Thank you for referral.   Pt does demonstrate UE tremors HA and pain with static sitting immediately after bed mobility. Pt reports reduced pain with extended sitting >10 minutes.     Follow Up Recommendations  No OT follow up    Equipment Recommendations  Other (comment) (RW)    Recommendations for Other Services       Precautions / Restrictions Precautions Precautions: Fall;Back Precaution Booklet Issued: Yes (comment) Precaution Comments: revied precautions Required Braces or Orthoses: Spinal Brace Spinal Brace: Lumbar corset;Applied in sitting position Restrictions Weight Bearing Restrictions: No      Mobility Bed Mobility Overal bed mobility: Needs Assistance Bed Mobility: Rolling;Sidelying to Sit Rolling: Modified independent (Device/Increase time) Sidelying to sit: Supervision       General bed mobility comments: HOB flat and rails lowered to simulate home environment. VC's for proper log roll technique.   Transfers Overall transfer level: Needs assistance Equipment used: Rolling walker (2 wheeled) Transfers: Sit to/from Stand Sit to Stand: Supervision              Balance Overall balance assessment: Needs assistance Sitting-balance support: Feet supported;No upper extremity supported Sitting balance-Leahy Scale: Fair     Standing balance support: Bilateral upper extremity supported;During functional activity Standing balance-Leahy Scale: Fair Standing balance comment: able to  release and brush teeth                           ADL either performed or assessed with clinical judgement   ADL Overall ADL's : Needs assistance/impaired Eating/Feeding: Independent   Grooming: Wash/dry hands   Upper Body Bathing: Independent   Lower Body Bathing: Moderate assistance Lower Body Bathing Details (indicate cue type and reason): unable to dress LB and will have family (A) at this time Upper Body Dressing : Independent       Toilet Transfer: Independent   Toileting- Clothing Manipulation and Hygiene: Minimal assistance       Functional mobility during ADLs: Supervision/safety General ADL Comments: Pt reports HA immediately upon sitting up. pt reports with extended time the HA fades and so does the UE tremors. Pt reports "i hope i am not having a panic attack. " pt does demonstrate decr anxiety with continued session   Pt educated on bathing and avoid washing directly on incision. Pt educated to use new wash cloth and towel each day. Pt educated to allow water to run across dressing and not to soak in a tub at this time. Pt advised RN will instruct on any bandages required otherwise is open to air.   Back handout provided and reviewed adls in detail. Pt educated on: clothing between brace, never sleep in brace, set an alarm at night for medication, avoid sitting for long periods of time, correct bed positioning for sleeping, correct sequence for bed mobility, avoiding lifting more than 5 pounds and never wash directly over incision. All education is complete and patient indicates understanding.    Vision Baseline Vision/History: Wears glasses Wears Glasses: At all times  Perception     Praxis      Pertinent Vitals/Pain Pain Assessment: Faces Faces Pain Scale: Hurts even more Pain Location: head Pain Descriptors / Indicators: Headache Pain Intervention(s): Monitored during session;Premedicated before session;Repositioned     Hand Dominance  Right   Extremity/Trunk Assessment Upper Extremity Assessment Upper Extremity Assessment: Overall WFL for tasks assessed   Lower Extremity Assessment Lower Extremity Assessment: Defer to PT evaluation   Cervical / Trunk Assessment Cervical / Trunk Assessment: Other exceptions Cervical / Trunk Exceptions: s/p surgery   Communication Communication Communication: No difficulties   Cognition Arousal/Alertness: Awake/alert Behavior During Therapy: WFL for tasks assessed/performed Overall Cognitive Status: Within Functional Limits for tasks assessed                                     General Comments  dress dry and intact    Exercises     Shoulder Instructions      Home Living Family/patient expects to be discharged to:: Private residence Living Arrangements: Spouse/significant other;Parent Available Help at Discharge: Family;Available 24 hours/day Type of Home: House Home Access: Stairs to enter [employer] of Steps: 4 Entrance Stairs-Rails: Right Home Layout: Two level Alternate Level Stairs-Number of Steps: 1 small step down to bedroom but has a ramp   Bathroom Shower/Tub: Occupational psychologist: Standard Bathroom Accessibility: Yes   Home Equipment: Environmental consultant - 2 wheels;Cane - single point;Tub bench;Bedside commode   Additional Comments: mother granddaughter and great granddaughter in addition to spouse are availabel to (A)      Prior Functioning/Environment Level of Independence: Independent with assistive device(s)        Comments: Pt reports using the walker PTA due to pain        OT Problem List:        OT Treatment/Interventions:      OT Goals(Current goals can be found in the care plan section) Acute Rehab OT Goals Patient Stated Goal: home  OT Frequency:     Barriers to D/C:            Co-evaluation              AM-PAC PT "6 Clicks" Daily Activity     Outcome Measure Help from another person  eating meals?: None Help from another person taking care of personal grooming?: None Help from another person toileting, which includes using toliet, bedpan, or urinal?: A Little Help from another person bathing (including washing, rinsing, drying)?: A Little Help from another person to put on and taking off regular upper body clothing?: None Help from another person to put on and taking off regular lower body clothing?: A Little 6 Click Score: 21   End of Session Equipment Utilized During Treatment: Gait belt;Rolling walker;Back brace Nurse Communication: Mobility status;Precautions  Activity Tolerance: Patient tolerated treatment well Patient left: in bed;with call bell/phone within reach;with family/visitor present  OT Visit Diagnosis: Unsteadiness on feet (R26.81)                Time: 6945-0388 OT Time Calculation (min): 43 min Charges:  OT General Charges $OT Visit: 1 Procedure OT Evaluation $OT Eval Moderate Complexity: 1 Procedure OT Treatments $Self Care/Home Management : 23-37 mins G-Codes:      Jeri Modena   OTR/L Pager: [phone] Office: [phone] .   Parke Poisson B 01/31/2017, 2:47 PM

## 2017-01-31 NOTE — Evaluation (Signed)
Physical Therapy Evaluation Patient Details Name: [name] MRN: [id number] DOB: 02/18/1951 Today's Date: 01/31/2017   History of Present Illness  Pt is a 66 y/o female s/p L3-L5 PLIF on 01/30/17.  Clinical Impression  Pt admitted with above diagnosis. Pt currently with functional limitations due to the deficits listed below (see PT Problem List). At the time of PT eval pt was able to perform transfers and ambulation with gross min guard assist for balance support and safety. Pt reports dizziness upon sitting up that did not subside throughout session. BP stable at 157/76. RN notified. Pt will benefit from skilled PT to increase their independence and safety with mobility to allow discharge to the venue listed below.     Follow Up Recommendations No PT follow up;Supervision for mobility/OOB    Equipment Recommendations  None recommended by PT    Recommendations for Other Services       Precautions / Restrictions Precautions Precautions: Fall;Back Precaution Booklet Issued: Yes (comment) Precaution Comments: Reviewed handout in detail and pt was cued for precuations during functional mobility.  Required Braces or Orthoses: Spinal Brace Spinal Brace: Lumbar corset;Applied in sitting position Restrictions Weight Bearing Restrictions: No      Mobility  Bed Mobility Overal bed mobility: Needs Assistance Bed Mobility: Rolling;Sidelying to Sit Rolling: Modified independent (Device/Increase time) Sidelying to sit: Supervision       General bed mobility comments: HOB flat and rails lowered to simulate home environment. VC's for proper log roll technique.   Transfers Overall transfer level: Needs assistance Equipment used: Rolling walker (2 wheeled) Transfers: Sit to/from Stand Sit to Stand: Min guard         General transfer comment: Hands on guarding for safety as pt powered up to full standing position.   Ambulation/Gait Ambulation/Gait assistance: Min guard Ambulation  Distance (Feet): 275 Feet Assistive device: Rolling walker (2 wheeled) Gait Pattern/deviations: Step-through pattern;Decreased stride length;Trunk flexed Gait velocity: Decreased Gait velocity interpretation: Below normal speed for age/gender General Gait Details: VC's for improved posture, bilateral shoulder relaxation/depression during walker use, and cues for standing rest breaks for energy conservation. Pt reports consistent dizziness throughout.  Stairs            Wheelchair Mobility    Modified Rankin (Stroke Patients Only)       Balance Overall balance assessment: Needs assistance Sitting-balance support: Feet supported;No upper extremity supported Sitting balance-Leahy Scale: Fair     Standing balance support: Bilateral upper extremity supported;During functional activity Standing balance-Leahy Scale: Poor Standing balance comment: Reliant on UE support                             Pertinent Vitals/Pain Pain Assessment: Faces Faces Pain Scale: Hurts little more    Home Living Family/patient expects to be discharged to:: Private residence Living Arrangements: Spouse/significant other;Parent Available Help at Discharge: Family;Available 24 hours/day Type of Home: House Home Access: Stairs to enter Entrance Stairs-Rails: Right Entrance Stairs-Number of Steps: 4 Home Layout: Two level Home Equipment: Walker - 2 wheels;Cane - single point;Tub bench;Bedside commode      Prior Function Level of Independence: Independent with assistive device(s)         Comments: Pt reports using the walker PTA due to pain     Hand Dominance   Dominant Hand: Right    Extremity/Trunk Assessment   Upper Extremity Assessment Upper Extremity Assessment: Defer to OT evaluation    Lower Extremity Assessment Lower Extremity  Assessment: Generalized weakness (fatigues quickly - consistent with pre-op diagnosis)    Cervical / Trunk Assessment Cervical / Trunk  Assessment: Other exceptions Cervical / Trunk Exceptions: s/p surgery  Communication   Communication: No difficulties  Cognition Arousal/Alertness: Awake/alert Behavior During Therapy: WFL for tasks assessed/performed Overall Cognitive Status: Within Functional Limits for tasks assessed                                        General Comments      Exercises     Assessment/Plan    PT Assessment Patient needs continued PT services  PT Problem List Decreased strength;Decreased range of motion;Decreased activity tolerance;Decreased balance;Decreased mobility;Decreased knowledge of use of DME;Decreased safety awareness;Decreased knowledge of precautions;Pain       PT Treatment Interventions DME instruction;Gait training;Stair training;Functional mobility training;Therapeutic activities;Therapeutic exercise;Neuromuscular re-education;Patient/family education    PT Goals (Current goals can be found in the Care Plan section)  Acute Rehab PT Goals Patient Stated Goal: Home tomorrow PT Goal Formulation: With patient Time For Goal Achievement: 02/07/17 Potential to Achieve Goals: Good    Frequency Min 5X/week   Barriers to discharge        Co-evaluation               AM-PAC PT "6 Clicks" Daily Activity  Outcome Measure Difficulty turning over in bed (including adjusting bedclothes, sheets and blankets)?: None Difficulty moving from lying on back to sitting on the side of the bed? : A Little Difficulty sitting down on and standing up from a chair with arms (e.g., wheelchair, bedside commode, etc,.)?: A Little Help needed moving to and from a bed to chair (including a wheelchair)?: A Little Help needed walking in hospital room?: A Little Help needed climbing 3-5 steps with a railing? : A Little 6 Click Score: 19    End of Session Equipment Utilized During Treatment: Back brace Activity Tolerance: Patient tolerated treatment well Patient left: in chair;with  call bell/phone within reach Nurse Communication: Mobility status PT Visit Diagnosis: Unsteadiness on feet (R26.81);Other symptoms and signs involving the nervous system (R29.898)    Time: 5631-4970 PT Time Calculation (min) (ACUTE ONLY): 40 min   Charges:   PT Evaluation $PT Eval Moderate Complexity: 1 Procedure PT Treatments $Gait Training: 23-37 mins   PT G Codes:        Rolinda Roan, PT, DPT Acute Rehabilitation Services Pager: [phone]   Thelma Comp 01/31/2017, 10:20 AM

## 2017-02-01 MED ORDER — OXYMETAZOLINE HCL 0.05 % NA SOLN
1.0000 | Freq: Two times a day (BID) | NASAL | Status: DC
  Filled 2017-02-01: qty 15

## 2017-02-01 MED ORDER — HYDROCODONE-ACETAMINOPHEN 5-325 MG PO TABS: 1.0000 | ORAL_TABLET | ORAL | 0 refills | Status: DC | PRN

## 2017-02-01 MED ORDER — METHOCARBAMOL 500 MG PO TABS: 500.0000 mg | ORAL_TABLET | Freq: Four times a day (QID) | ORAL | 3 refills | Status: DC | PRN

## 2017-02-01 NOTE — Discharge Summary (Signed)
Physician Discharge Summary  Patient ID: [name] MRN: [id number] DOB/AGE: 10/01/1950 66 y.o.  Admit date: 01/30/2017 Discharge date: 02/01/2017  Admission Diagnoses:Lumbar spondylosis and spondylolisthesis with stenosis L3-4 L4-5  Discharge Diagnoses: Lumbar spondylolisthesis L3-4 L4-5 with radiculopathy, neurogenic claudication. Active Problems:   Spondylolisthesis at L4-L5 level   Discharged Condition: good  Hospital Course: Patient was admitted to undergo surgery which she tolerated well.  Consults: None  Significant Diagnostic Studies: None  Treatments: surgery: Laminectomy and decompression of L3-4 and L4-5 posterior lumbar interbody arthrodesis with peek spacers local autograft allograft, segmental pedicle fixation L3-L5 with posterior lateral arthrodesis L3-L5.  Discharge Exam: Blood pressure 104/62, pulse 70, temperature 98.6 F (37 C), temperature source Oral, resp. rate 18, height 5\' 2"  (1.575 m), weight 76.5 kg (168 lb 11.2 oz), SpO2 100 %. Incision is clean and dry motor function is intact station and gait are intact  Disposition: Home  Discharge Instructions    Call MD for:  redness, tenderness, or signs of infection (pain, swelling, redness, odor or green/yellow discharge around incision site)    Complete by:  As directed    Call MD for:  severe uncontrolled pain    Complete by:  As directed    Call MD for:  temperature >100.4    Complete by:  As directed    Diet - low sodium heart healthy    Complete by:  As directed    Discharge instructions    Complete by:  As directed    Okay to shower. Do not apply salves or appointments to incision. No heavy lifting with the upper extremities greater than 15 pounds. May resume driving when not requiring pain medication and patient feels comfortable with doing so.   Incentive spirometry RT    Complete by:  As directed    Increase activity slowly    Complete by:  As directed      Allergies as of 02/01/2017    Reactions   Iodine Swelling   Red spots on skin after cath, swollen lips and eyes   Adhesive [tape] Dermatitis   Plastic tape causes skin irritation    Niacin Rash   MAY BE PREDICTABLE RESPONSE TO NIACIN   Penicillins Rash      Medication List    TAKE these medications   ALPRAZolam 0.5 MG tablet Commonly known as:  XANAX Take 0.5 mg by mouth 3 (three) times daily as needed for anxiety.   amLODipine 10 MG tablet Commonly known as:  NORVASC Take 10 mg by mouth daily.   aspirin EC 81 MG tablet Take 81 mg by mouth daily.   atorvastatin 10 MG tablet Commonly known as:  LIPITOR Take 10 mg by mouth at bedtime.   furosemide 40 MG tablet Commonly known as:  LASIX Take 40 mg by mouth daily.   gabapentin 400 MG capsule Commonly known as:  NEURONTIN Take 800 mg by mouth 2 (two) times daily.   HYDROcodone-acetaminophen 5-325 MG tablet Commonly known as:  NORCO/VICODIN Take 1-2 tablets by mouth every 4 (four) hours as needed (breakthrough pain).   lisinopril-hydrochlorothiazide 10-12.5 MG tablet Commonly known as:  PRINZIDE,ZESTORETIC Take 1 tablet by mouth daily.   methocarbamol 500 MG tablet Commonly known as:  ROBAXIN Take 1 tablet (500 mg total) by mouth every 6 (six) hours as needed for muscle spasms.   metoprolol tartrate 100 MG tablet Commonly known as:  LOPRESSOR Take 100 mg by mouth 2 (two) times daily.   traMADol 50 MG tablet Commonly  known as:  ULTRAM Take 100 mg by mouth 3 (three) times daily as needed.        SignedEarleen Newport 02/01/2017, 9:25 AM

## 2017-02-01 NOTE — Progress Notes (Signed)
Physical Therapy Treatment Patient Details Name: [name] MRN: [id number] DOB: [birth year] Today's Date: 02/01/2017    History of Present Illness Pt is a 66 y/o female s/p L3-L5 PLIF on 01/30/17.    PT Comments    Pt progressing well towards physical therapy goals. Was able to tolerate stair training however required seated rest prior to attempting. Pt continues to demonstrate decreased tolerance for functional activity at this time. Will continue to follow and progress as able per POC.    Follow Up Recommendations  No PT follow up;Supervision for mobility/OOB     Equipment Recommendations  None recommended by PT    Recommendations for Other Services       Precautions / Restrictions Precautions Precautions: Fall;Back Precaution Booklet Issued: Yes (comment) Precaution Comments: revied precautions Required Braces or Orthoses: Spinal Brace Spinal Brace: Lumbar corset;Applied in sitting position Restrictions Weight Bearing Restrictions: No    Mobility  Bed Mobility               General bed mobility comments: Pt sitting up on EOB when PT arrived.   Transfers Overall transfer level: Needs assistance Equipment used: Rolling walker (2 wheeled) Transfers: Sit to/from Stand Sit to Stand: Supervision         General transfer comment: Close supervision for safety as pt powered up to full standing position.   Ambulation/Gait Ambulation/Gait assistance: Supervision Ambulation Distance (Feet): 200 Feet Assistive device: Rolling walker (2 wheeled) Gait Pattern/deviations: Step-through pattern;Decreased stride length;Trunk flexed Gait velocity: Decreased Gait velocity interpretation: Below normal speed for age/gender General Gait Details: Pt somewhat dyspneic and requiring a seated rest break prior to attempting stair training. Appeared to recover quickly with seated rest.   Stairs Stairs: Yes   Stair Management: Two rails;Step to pattern;Forwards Number of  Stairs: 4 General stair comments: VC's for sequencing and general safety.  Wheelchair Mobility    Modified Rankin (Stroke Patients Only)       Balance Overall balance assessment: Needs assistance Sitting-balance support: Feet supported;No upper extremity supported Sitting balance-Leahy Scale: Fair     Standing balance support: Bilateral upper extremity supported;During functional activity Standing balance-Leahy Scale: Fair                              Cognition Arousal/Alertness: Awake/alert Behavior During Therapy: WFL for tasks assessed/performed Overall Cognitive Status: Within Functional Limits for tasks assessed                                        Exercises      General Comments        Pertinent Vitals/Pain Pain Assessment: Faces Faces Pain Scale: Hurts even more Pain Location: head Pain Descriptors / Indicators: Headache Pain Intervention(s): Limited activity within patient's tolerance;Monitored during session    Home Living                      Prior Function            PT Goals (current goals can now be found in the care plan section) Acute Rehab PT Goals Patient Stated Goal: home PT Goal Formulation: With patient Time For Goal Achievement: 02/07/17 Potential to Achieve Goals: Good Progress towards PT goals: Progressing toward goals    Frequency    Min 5X/week      PT Plan Current plan remains  appropriate    Co-evaluation              AM-PAC PT "6 Clicks" Daily Activity  Outcome Measure  Difficulty turning over in bed (including adjusting bedclothes, sheets and blankets)?: None Difficulty moving from lying on back to sitting on the side of the bed? : A Little Difficulty sitting down on and standing up from a chair with arms (e.g., wheelchair, bedside commode, etc,.)?: A Little Help needed moving to and from a bed to chair (including a wheelchair)?: A Little Help needed walking in hospital  room?: A Little Help needed climbing 3-5 steps with a railing? : A Little 6 Click Score: 19    End of Session Equipment Utilized During Treatment: Back brace Activity Tolerance: Patient tolerated treatment well Patient left: in chair;with call bell/phone within reach Nurse Communication: Mobility status PT Visit Diagnosis: Unsteadiness on feet (R26.81);Other symptoms and signs involving the nervous system (R29.898)     Time: 7867-5449 PT Time Calculation (min) (ACUTE ONLY): 25 min  Charges:  $Gait Training: 23-37 mins                    G Codes:       [name], PT, DPT Acute Rehabilitation Services Pager: [phone]    [name] 02/01/2017, 11:33 AM

## 2017-02-01 NOTE — Progress Notes (Signed)
Pt and family given D/C instructions with Rx's, verbal understanding was provided. Pt's dressing was removed before D/C. Pt's IV was taken out prior to D/C. Pt D/C'd home via wheelchair @ 1140 per MD order. Pt received RW from [place] prior to D/C. Pt is stable @ D/C and has no other needs at this time. Holli Humbles, RN

## 2017-02-01 NOTE — Discharge Instructions (Signed)

## 2017-02-02 ENCOUNTER — Emergency Department (HOSPITAL_COMMUNITY)
Admission: EM | Admit: 2017-02-02 | Discharge: 2017-02-02 | Disposition: A | Discharge status: Home / Self Care | Payer: Medicare Other | Attending: Emergency Medicine | Admitting: Emergency Medicine

## 2017-02-02 ENCOUNTER — Encounter (HOSPITAL_COMMUNITY): Payer: Self-pay | Admitting: Emergency Medicine

## 2017-02-02 DIAGNOSIS — G43909 Migraine, unspecified, not intractable, without status migrainosus: Secondary | ICD-10-CM | POA: Insufficient documentation

## 2017-02-02 DIAGNOSIS — Z96642 Presence of left artificial hip joint: Secondary | ICD-10-CM | POA: Insufficient documentation

## 2017-02-02 DIAGNOSIS — R52 Pain, unspecified: Secondary | ICD-10-CM | POA: Diagnosis not present

## 2017-02-02 DIAGNOSIS — F17210 Nicotine dependence, cigarettes, uncomplicated: Secondary | ICD-10-CM | POA: Insufficient documentation

## 2017-02-02 DIAGNOSIS — Z79899 Other long term (current) drug therapy: Secondary | ICD-10-CM | POA: Diagnosis not present

## 2017-02-02 DIAGNOSIS — I10 Essential (primary) hypertension: Secondary | ICD-10-CM | POA: Insufficient documentation

## 2017-02-02 DIAGNOSIS — M5489 Other dorsalgia: Secondary | ICD-10-CM | POA: Diagnosis not present

## 2017-02-02 DIAGNOSIS — I2510 Atherosclerotic heart disease of native coronary artery without angina pectoris: Secondary | ICD-10-CM | POA: Diagnosis not present

## 2017-02-02 DIAGNOSIS — Z96651 Presence of right artificial knee joint: Secondary | ICD-10-CM | POA: Insufficient documentation

## 2017-02-02 DIAGNOSIS — R51 Headache: Secondary | ICD-10-CM | POA: Diagnosis present

## 2017-02-02 DIAGNOSIS — G4489 Other headache syndrome: Secondary | ICD-10-CM | POA: Diagnosis not present

## 2017-02-02 DIAGNOSIS — Z7982 Long term (current) use of aspirin: Secondary | ICD-10-CM | POA: Diagnosis not present

## 2017-02-02 DIAGNOSIS — G43009 Migraine without aura, not intractable, without status migrainosus: Secondary | ICD-10-CM

## 2017-02-02 MED ORDER — PREDNISONE 20 MG PO TABS: ORAL_TABLET | ORAL | 0 refills | Status: DC

## 2017-02-02 MED ORDER — METOCLOPRAMIDE HCL 5 MG/ML IJ SOLN
10.0000 mg | Freq: Once | INTRAMUSCULAR | Status: AC
  Administered 2017-02-02: 10 mg via INTRAVENOUS
  Filled 2017-02-02: qty 2

## 2017-02-02 MED ORDER — DIPHENHYDRAMINE HCL 50 MG/ML IJ SOLN
25.0000 mg | Freq: Once | INTRAMUSCULAR | Status: AC
  Administered 2017-02-02: 25 mg via INTRAVENOUS
  Filled 2017-02-02: qty 1

## 2017-02-02 MED ORDER — SODIUM CHLORIDE 0.9 % IV BOLUS (SEPSIS)
500.0000 mL | Freq: Once | INTRAVENOUS | Status: AC
  Administered 2017-02-02: 500 mL via INTRAVENOUS

## 2017-02-02 MED ORDER — DEXAMETHASONE SODIUM PHOSPHATE 10 MG/ML IJ SOLN
10.0000 mg | Freq: Once | INTRAMUSCULAR | Status: AC
  Administered 2017-02-02: 10 mg via INTRAVENOUS
  Filled 2017-02-02: qty 1

## 2017-02-02 MED ORDER — MAGNESIUM SULFATE 2 GM/50ML IV SOLN
2.0000 g | Freq: Once | INTRAVENOUS | Status: DC
  Filled 2017-02-02: qty 50

## 2017-02-02 MED ORDER — SODIUM CHLORIDE 0.9 % IV BOLUS (SEPSIS)
1000.0000 mL | Freq: Once | INTRAVENOUS | Status: AC
  Administered 2017-02-02: 1000 mL via INTRAVENOUS

## 2017-02-02 MED ORDER — MAGNESIUM SULFATE 50 % IJ SOLN
Freq: Once | INTRAVENOUS | Status: AC
  Administered 2017-02-02: 08:00:00 via INTRAVENOUS
  Filled 2017-02-02: qty 4

## 2017-02-02 NOTE — Discharge Instructions (Signed)
Take the pain medications you were already prescribed by Dr Ellene Route. Take the prednisone as written.  Drink plenty of fluids. Let Dr Clarice Pole office now if you are getting worse.

## 2017-02-02 NOTE — ED Notes (Signed)
Assisted pt to bedside commode.

## 2017-02-02 NOTE — ED Provider Notes (Signed)
[place] DEPT Provider Note   CSN: [id number] Arrival date & time: 02/02/17  0354  Time seen 06:00 AM   History   Chief Complaint Chief Complaint  Patient presents with  . Back Pain  . Headache    HPI [name] is a 66 y.o. female.  HPI  patient had surgery on her lumbar spine on July 10 by Dr. Ellene Route, neurosurgeon. She had a laminectomy and decompression of L3-4 and L4-5 posterior lumbar interbody arthrodesis with peek spacers local autograft allograft, segmental pedicle fixation L3-L5 with posterior lateral arthrodesis L3-L5. She was discharged yesterday about 11:30 in the morning. She states she felt fine the first night after her surgery however she started getting more painful when she was discharged from the hospital. She states she started having a headache yesterday morning which her surgeon was aware of. She states it is frontal and now is on the top and back of her head. She states it's pounding and she feels like her head is going to explode. She has nausea without vomiting. She states she has photophobia and phonophobia, and nothing makes it feel better. She states she's had headaches like this in the past and used to get migraines 1-2 times year however she has not had them in about 20 years. She states now she is having pain in both her legs and she feels like the front of her thighs painful and it feels better if her husband massages them. She states she had a left hip replacement done by Dr. Ninfa Linden and she had a right knee replacement done by Dr. Aline Brochure and she feels like her right knee replacement is not doing well. She has seen another orthopedist who wants to wait until she gets her back issues cleared up before addressing the problems with her right knee. She states she's afraid if she stands up she is going to fall because her right knee will give out on her. She states she has been walking around her house with a walker since her surgery.  PCP Rory Percy,  MD Neurosurgery Dr Ellene Route  Past Medical History:  Diagnosis Date  . Anxiety   . Arthritis   . Complication of anesthesia    "don't wake up easily"  . Coronary artery disease    LAD Cypher stent with PCI of the diagonal in 2004.    . Depression   . Dyslipidemia   . Fibromyalgia   . Headache    history of migraines  . Hypertension     Patient Active Problem List   Diagnosis Date Noted  . Spondylolisthesis at L4-L5 level 01/30/2017  . Bruit 01/11/2017  . Preop cardiovascular exam 01/11/2017  . Coronary artery disease involving native heart with angina pectoris ([place]) 01/11/2017  . TENDINITIS, ELBOW 03/18/2008  . TOTAL KNEE FOLLOW-UP 09/24/2007  . Osteoarthrosis, unspecified whether generalized or localized, lower leg 04/12/2007  . KNEE PAIN 04/12/2007    Past Surgical History:  Procedure Laterality Date  . ABDOMINAL HYSTERECTOMY     partial first, then full  . CARDIAC CATHETERIZATION    . CARPAL TUNNEL RELEASE Right   . CHOLECYSTECTOMY    . COLONOSCOPY    . REPLACEMENT TOTAL KNEE Right   . SHOULDER ARTHROSCOPY Right   . TOTAL HIP ARTHROPLASTY Left   . TUBAL LIGATION      OB History    No data available       Home Medications    Prior to Admission medications   Medication Sig  Start Date End Date Taking? Authorizing Provider  ALPRAZolam Duanne Moron) 0.5 MG tablet Take 0.5 mg by mouth 3 (three) times daily as needed for anxiety.    [provider]  amLODipine (NORVASC) 10 MG tablet Take 10 mg by mouth daily.    [provider]  aspirin EC 81 MG tablet Take 81 mg by mouth daily.    [provider]  atorvastatin (LIPITOR) 10 MG tablet Take 10 mg by mouth at bedtime.    [provider]  furosemide (LASIX) 40 MG tablet Take 40 mg by mouth daily.    [provider]  gabapentin (NEURONTIN) 400 MG capsule Take 800 mg by mouth 2 (two) times daily.    [provider]  HYDROcodone-acetaminophen (NORCO/VICODIN) 5-325 MG  tablet Take 1-2 tablets by mouth every 4 (four) hours as needed (breakthrough pain). 02/01/17   Kristeen Miss, MD  lisinopril-hydrochlorothiazide (PRINZIDE,ZESTORETIC) 10-12.5 MG tablet Take 1 tablet by mouth daily.    [provider]  methocarbamol (ROBAXIN) 500 MG tablet Take 1 tablet (500 mg total) by mouth every 6 (six) hours as needed for muscle spasms. 02/01/17   Kristeen Miss, MD  metoprolol tartrate (LOPRESSOR) 100 MG tablet Take 100 mg by mouth 2 (two) times daily.    [provider]  predniSONE (DELTASONE) 20 MG tablet Take 3 po QD x 3d , then 2 po QD x 3d then 1 po QD x 3d 02/02/17   Rolland Porter, MD  traMADol (ULTRAM) 50 MG tablet Take 100 mg by mouth 3 (three) times daily as needed.     [provider]    Family History Family History  Problem Relation Age of Onset  . Heart attack Father 97  . Cancer Brother        Lung    Social History Social History  Substance Use Topics  . Smoking status: Current Every Day Smoker    Packs/day: 0.75    Years: 45.00    Types: Cigarettes  . Smokeless tobacco: Never Used  . Alcohol use No     Comment: occasional glass of wine  lives at home   Allergies   Iodine; Adhesive [tape]; Niacin; and Penicillins   Review of Systems Review of Systems  All other systems reviewed and are negative.    Physical Exam Updated Vital Signs BP (!) 131/42 (BP Location: Left Arm) Comment: pt lying  Pulse (!) 58   Temp 98.4 F (36.9 C) (Oral)   Resp 20   Ht 5\' 5"  (1.651 m)   Wt 74.4 kg (164 lb)   SpO2 98%   BMI 27.29 kg/m   Vital signs normal    Physical Exam  Constitutional: She is oriented to person, place, and time. She appears well-developed and well-nourished.  Non-toxic appearance. She does not appear ill. No distress.  HENT:  Head: Normocephalic and atraumatic.  Right Ear: External ear normal.  Left Ear: External ear normal.  Nose: Nose normal. No mucosal edema or rhinorrhea.  Mouth/Throat: Oropharynx  is clear and moist and mucous membranes are normal. No dental abscesses or uvula swelling.  Eyes: Pupils are equal, round, and reactive to light. Conjunctivae and EOM are normal.  Neck: Normal range of motion and full passive range of motion without pain. Neck supple.  Cardiovascular: Normal rate, regular rhythm and normal heart sounds.  Exam reveals no gallop and no friction rub.   No murmur heard. Pulmonary/Chest: Effort normal and breath sounds normal. No respiratory distress. She has no wheezes.  She has no rhonchi. She has no rales. She exhibits no tenderness and no crepitus.  Abdominal: Soft. Normal appearance and bowel sounds are normal. She exhibits no distension. There is no tenderness. There is no rebound and no guarding.  Musculoskeletal: Normal range of motion. She exhibits no edema or tenderness.  Moves all extremities well. Sensation is intact. Her incision looks intact with some bruising inferiorly consistent with recent surgery. There is no redness of the soft tissues around the incision. There is no significant swelling or induration felt. There is no purulent drainage seen.  Neurological: She is alert and oriented to person, place, and time. She has normal strength. No cranial nerve deficit.  Skin: Skin is warm, dry and intact. No rash noted. No erythema. No pallor.  Psychiatric: She has a normal mood and affect. Her speech is normal and behavior is normal. Her mood appears not anxious.  Nursing note and vitals reviewed.    ED Treatments / Results  Labs (all labs ordered are listed, but only abnormal results are displayed) Labs Reviewed - No data to display  EKG  EKG Interpretation None       Radiology No results found.  Procedures Procedures (including critical care time)  Medications Ordered in ED Medications  magnesium sulfate 2 g in dextrose 5 % 50 mL injection ( Intravenous New Bag/Given 02/02/17 0821)  dexamethasone (DECADRON) injection 10 mg (not  administered)  sodium chloride 0.9 % bolus 1,000 mL (1,000 mLs Intravenous New Bag/Given 02/02/17 0656)  sodium chloride 0.9 % bolus 500 mL (500 mLs Intravenous New Bag/Given 02/02/17 0655)  metoCLOPramide (REGLAN) injection 10 mg (10 mg Intravenous Given 02/02/17 0656)  diphenhydrAMINE (BENADRYL) injection 25 mg (25 mg Intravenous Given 02/02/17 0656)     Initial Impression / Assessment and Plan / ED Course  I have reviewed the triage vital signs and the nursing notes.  Pertinent labs & imaging results that were available during my care of the patient were reviewed by me and considered in my medical decision making (see chart for details).  Patient was given IV fluids and migraine cocktail.  Recheck at 7:30 AM she states her headache has not changed. IV magnesium was added. I will talk to her neurosurgeon. Her BUN and creatinine were normal on September 11  08:32 Dr Ellene Route, give IV decadron and send home prednisone on taper  Recheck at 9 AM patient is feeling better, her IV magnesium has almost completed. She is getting her IV Decadron now. She was advised that she is going to be discharged home and if she has any worsening problems she should let Dr. Ellene Route know.  Review of the [place] shows patient started getting tramadol in March, her PCP started writing her for #90 tramadol 50 mg tablets on a regular basis on March 27, last filled March 25, and #90 alprazolam 0.5 mg last filled 7/6. These have all been prescribed by her PCP. Per her discharge note patient was prescribed hydrocodone and Robaxin after her surgery.  Final Clinical Impressions(s) / ED Diagnoses   Final diagnoses:  Migraine without aura and without status migrainosus, not intractable    New Prescriptions New Prescriptions   PREDNISONE (DELTASONE) 20 MG TABLET    Take 3 po QD x 3d , then 2 po QD x 3d then 1 po QD x 3d    Plan discharge  Rolland Porter, MD, Barbette Or, MD 02/02/17 [phone]

## 2017-02-02 NOTE — ED Triage Notes (Signed)
Pt a&o x 4, was discharged from [facility] today following back surgery on 01/30/2017, pt is wearing a back brace that she has had on since 01/31/2017 following surgery, c/o headache x 1 day, lower back pain and leg pain

## 2017-02-02 NOTE — ED Notes (Signed)
Pt assisted to bedside commode

## 2017-02-02 NOTE — ED Notes (Addendum)
Pt made aware to return if symptoms worsen or if any life threatening symptoms occur.  Dr. Sabra Heck aware of bp 145/45, okay to discharge at this time.

## 2017-02-22 DIAGNOSIS — M4316 Spondylolisthesis, lumbar region: Secondary | ICD-10-CM | POA: Diagnosis not present

## 2017-03-12 DIAGNOSIS — N3000 Acute cystitis without hematuria: Secondary | ICD-10-CM | POA: Diagnosis not present

## 2017-03-12 DIAGNOSIS — Z6835 Body mass index (BMI) 35.0-35.9, adult: Secondary | ICD-10-CM | POA: Diagnosis not present

## 2017-03-12 DIAGNOSIS — K120 Recurrent oral aphthae: Secondary | ICD-10-CM | POA: Diagnosis not present

## 2017-04-05 DIAGNOSIS — M4316 Spondylolisthesis, lumbar region: Secondary | ICD-10-CM | POA: Diagnosis not present

## 2017-04-09 DIAGNOSIS — M25561 Pain in right knee: Secondary | ICD-10-CM | POA: Diagnosis not present

## 2017-04-09 DIAGNOSIS — Z96651 Presence of right artificial knee joint: Secondary | ICD-10-CM | POA: Diagnosis not present

## 2017-04-09 DIAGNOSIS — G8929 Other chronic pain: Secondary | ICD-10-CM | POA: Insufficient documentation

## 2017-04-09 DIAGNOSIS — M25551 Pain in right hip: Secondary | ICD-10-CM | POA: Insufficient documentation

## 2017-04-09 DIAGNOSIS — Z96642 Presence of left artificial hip joint: Secondary | ICD-10-CM | POA: Diagnosis not present

## 2017-04-13 DIAGNOSIS — M4316 Spondylolisthesis, lumbar region: Secondary | ICD-10-CM | POA: Diagnosis not present

## 2017-04-13 DIAGNOSIS — M47816 Spondylosis without myelopathy or radiculopathy, lumbar region: Secondary | ICD-10-CM | POA: Diagnosis not present

## 2017-04-13 DIAGNOSIS — M1611 Unilateral primary osteoarthritis, right hip: Secondary | ICD-10-CM | POA: Diagnosis not present

## 2017-04-13 DIAGNOSIS — M25859 Other specified joint disorders, unspecified hip: Secondary | ICD-10-CM | POA: Diagnosis not present

## 2017-04-13 DIAGNOSIS — M25851 Other specified joint disorders, right hip: Secondary | ICD-10-CM | POA: Diagnosis not present

## 2017-04-24 DIAGNOSIS — Z96642 Presence of left artificial hip joint: Secondary | ICD-10-CM | POA: Diagnosis not present

## 2017-04-24 DIAGNOSIS — M25551 Pain in right hip: Secondary | ICD-10-CM | POA: Diagnosis not present

## 2017-04-24 DIAGNOSIS — M1611 Unilateral primary osteoarthritis, right hip: Secondary | ICD-10-CM | POA: Diagnosis not present

## 2017-04-25 DIAGNOSIS — Z6835 Body mass index (BMI) 35.0-35.9, adult: Secondary | ICD-10-CM | POA: Diagnosis not present

## 2017-04-25 DIAGNOSIS — M797 Fibromyalgia: Secondary | ICD-10-CM | POA: Diagnosis not present

## 2017-04-25 DIAGNOSIS — N3000 Acute cystitis without hematuria: Secondary | ICD-10-CM | POA: Diagnosis not present

## 2017-04-25 DIAGNOSIS — F419 Anxiety disorder, unspecified: Secondary | ICD-10-CM | POA: Diagnosis not present

## 2017-04-25 DIAGNOSIS — I10 Essential (primary) hypertension: Secondary | ICD-10-CM | POA: Diagnosis not present

## 2017-04-25 DIAGNOSIS — M1611 Unilateral primary osteoarthritis, right hip: Secondary | ICD-10-CM | POA: Diagnosis not present

## 2017-04-25 DIAGNOSIS — Z23 Encounter for immunization: Secondary | ICD-10-CM | POA: Diagnosis not present

## 2017-05-07 ENCOUNTER — Ambulatory Visit (INDEPENDENT_AMBULATORY_CARE_PROVIDER_SITE_OTHER): Payer: Medicare Other

## 2017-05-07 ENCOUNTER — Ambulatory Visit (INDEPENDENT_AMBULATORY_CARE_PROVIDER_SITE_OTHER): Payer: Medicare Other | Admitting: Orthopaedic Surgery

## 2017-05-07 DIAGNOSIS — G8929 Other chronic pain: Secondary | ICD-10-CM | POA: Diagnosis not present

## 2017-05-07 DIAGNOSIS — I25118 Atherosclerotic heart disease of native coronary artery with other forms of angina pectoris: Secondary | ICD-10-CM | POA: Diagnosis not present

## 2017-05-07 DIAGNOSIS — M25551 Pain in right hip: Secondary | ICD-10-CM | POA: Diagnosis not present

## 2017-05-07 DIAGNOSIS — M25561 Pain in right knee: Secondary | ICD-10-CM

## 2017-05-07 DIAGNOSIS — M1611 Unilateral primary osteoarthritis, right hip: Secondary | ICD-10-CM

## 2017-05-07 NOTE — Progress Notes (Signed)
Office Visit Note   Patient: [name]           Date of Birth: [birth year]           MRN: [id number] Visit Date: 05/07/2017              Requested by: Rory Percy, MD [place], [place] [address] PCP: Rory Percy, MD   Assessment & Plan: Visit Diagnoses:  1. Chronic pain of right knee   2. Pain of right hip joint   3. Unilateral primary osteoarthritis, right hip     Plan:  At this point she is tried and failed all forms conservative treatment and the arthritis in her right hip is severe confirmed on plain films, MRI, and clinical exam. At this point I am recommending a total hip arthroplasty. We had a long and thorough discussion about this as well as a discussion of the risks and benefits of the surgery. Given the failure of all forms conservative treatment measures as well as the findings of all her studies I believe this is medically warranted at this standpoint. All questions concerns were answered and addressed.  Follow-Up Instructions: Return for 2 weeks post-op.   Orders:  Orders Placed This Encounter  Procedures  . XR Knee 1-2 Views Right   No orders of the defined types were placed in this encounter.     Procedures: No procedures performed   Clinical Data: No additional findings.   Subjective: No chief complaint on file. The patient comes in with chief complaint of severe right hip pain. She is someone actually performed a hip replacement on her left hip in 2012. Her pain is daily and is now detrimentally affected her activities daily living, her quality of life, her mobility. She has had significant back surgery of the lumbar spine in July of this year. She also has remote history of a right total knee arthroplasty. Her pain is mainly in her groin. She has plain films and MRI for my review. I actually have plain films from 2012 to compare them to her more recent films from this year. Her pain is 10 out of 10 and she has tried and failed all forms of  conservative treatment of this standpoint. Is been going on for well over a year at this point. All her conservative treatment is been for over a year as well.  HPI  Review of Systems He currently denies any headache, chest pain, shortness of breath, fever, chills, nausea, vomiting.  Objective: Vital Signs: There were no vitals taken for this visit.  Physical Exam He is alert and oriented 3 and in no acute distress Ortho Exam Exam lamination of her left hip is normal. Examination her right hip shows severe pain with any attempts of internal rotation rotation. Her right knee exam is normal with excellent range of motion. The right knee is limits and stable with no effusion and no swelling or redness. Specialty Comments:  No specialty comments available.  Imaging: Xr Knee 1-2 Views Right  Result Date: 05/07/2017 An AP and lateral of her right knee shows a stable total knee arthroplasty with no complicating features or acute findings.  I was able to independently review the MRI of her right hip as well as recent plain films of her right hip and knee to compare these to x-rays from 2012. She has severe end-stage arthritis of this point of the right hip. It is advanced. There is complete loss of the superior  lateral joint space. There are sclerotic changes in the femoral head and acetabulum. There is also periarticular osteophytes. The MRI does show edema in the acetabulum and the femoral head is well showing the stress changes of her severe arthritis.  PMFS History: Patient Active Problem List   Diagnosis Date Noted  . Spondylolisthesis at L4-L5 level 01/30/2017  . Bruit 01/11/2017  . Preop cardiovascular exam 01/11/2017  . Coronary artery disease involving native heart with angina pectoris ([place]) 01/11/2017  . TENDINITIS, ELBOW 03/18/2008  . TOTAL KNEE FOLLOW-UP 09/24/2007  . Osteoarthrosis, unspecified whether generalized or localized, lower leg 04/12/2007  . KNEE PAIN 04/12/2007    Past Medical History:  Diagnosis Date  . Anxiety   . Arthritis   . Complication of anesthesia    "don't wake up easily"  . Coronary artery disease    LAD Cypher stent with PCI of the diagonal in 2004.    . Depression   . Dyslipidemia   . Fibromyalgia   . Headache    history of migraines  . Hypertension     Family History  Problem Relation Age of Onset  . Heart attack Father 38  . Cancer Brother        Lung    Past Surgical History:  Procedure Laterality Date  . ABDOMINAL HYSTERECTOMY     partial first, then full  . CARDIAC CATHETERIZATION    . CARPAL TUNNEL RELEASE Right   . CHOLECYSTECTOMY    . COLONOSCOPY    . REPLACEMENT TOTAL KNEE Right   . SHOULDER ARTHROSCOPY Right   . TOTAL HIP ARTHROPLASTY Left   . TUBAL LIGATION     Social History   Occupational History  . Not on file.   Social History Main Topics  . Smoking status: Current Every Day Smoker    Packs/day: 0.75    Years: 45.00    Types: Cigarettes  . Smokeless tobacco: Never Used  . Alcohol use No     Comment: occasional glass of wine  . Drug use: No  . Sexual activity: Not on file

## 2017-05-10 DIAGNOSIS — M5416 Radiculopathy, lumbar region: Secondary | ICD-10-CM | POA: Diagnosis not present

## 2017-05-17 NOTE — Progress Notes (Signed)
Please place orders in EPIC as patient is being scheduled for a pre-op appointment! Thank you! 

## 2017-05-22 NOTE — Progress Notes (Signed)
Please place orders in EPIC as patient has a pre-op appointment on 11/2! Thank you!

## 2017-05-23 ENCOUNTER — Other Ambulatory Visit (INDEPENDENT_AMBULATORY_CARE_PROVIDER_SITE_OTHER): Payer: Self-pay | Admitting: Physician Assistant

## 2017-05-24 ENCOUNTER — Other Ambulatory Visit (HOSPITAL_COMMUNITY): Payer: Self-pay | Admitting: Emergency Medicine

## 2017-05-24 NOTE — Progress Notes (Signed)
LOV pre-op eval cardiology Dr Percival Spanish 01-10-17 epic  Stress test 01-12-17 epic  US carotid bilateral 01-12-17 epic  ECHO 04-27-15 epic

## 2017-05-24 NOTE — Patient Instructions (Signed)
AZAYLAH STAILEY  05/24/2017   Your procedure is scheduled on: 06-01-17  Report to [facility] Main  Entrance    Report to admitting at 1030AM   Call this number if you have problems the morning of surgery  [phone]   Remember: ONLY 1 PERSON MAY GO WITH YOU TO SHORT STAY TO GET  READY MORNING OF YOUR SURGERY.  Do not eat food After Midnight. You may have clear liquids from midnight until 7am day of surgery. Nothing by motuh after 7am!     Take these medicines the morning of surgery with A SIP OF WATER: tylenol if needed, xanax if needed, metoprolol, gabapentin                                 You may not have any metal on your body including hair pins and              piercings  Do not wear jewelry, make-up, lotions, powders or perfumes, deodorant             Do not wear nail polish.  Do not shave  48 hours prior to surgery.     Do not bring valuables to the hospital. [place].  Contacts, dentures or bridgework may not be worn into surgery.  Leave suitcase in the car. After surgery it may be brought to your room.                Please read over the following fact sheets you were given: _____________________________________________________________________   CLEAR LIQUID DIET   Foods Allowed                                                                     Foods Excluded  Coffee and tea, regular and decaf                             liquids that you cannot  Plain Jell-O in any flavor                                             see through such as: Fruit ices (not with fruit pulp)                                     milk, soups, orange juice  Iced Popsicles                                    All solid food Carbonated beverages, regular and diet  Cranberry, grape and apple juices Sports drinks like Gatorade Lightly seasoned clear broth or consume(fat free) Sugar,  honey syrup  Sample Menu Breakfast                                Lunch                                     Supper Cranberry juice                    Beef broth                            Chicken broth Jell-O                                     Grape juice                           Apple juice Coffee or tea                        Jell-O                                      Popsicle                                                Coffee or tea                        Coffee or tea  _____________________________________________________________________ [facility] - Preparing for Surgery Before surgery, you can play an important role.  Because skin is not sterile, your skin needs to be as free of germs as possible.  You can reduce the number of germs on your skin by washing with CHG (chlorahexidine gluconate) soap before surgery.  CHG is an antiseptic cleaner which kills germs and bonds with the skin to continue killing germs even after washing. Please DO NOT use if you have an allergy to CHG or antibacterial soaps.  If your skin becomes reddened/irritated stop using the CHG and inform your nurse when you arrive at Short Stay. Do not shave (including legs and underarms) for at least 48 hours prior to the first CHG shower.  You may shave your face/neck. Please follow these instructions carefully:  1.  Shower with CHG Soap the night before surgery and the  morning of Surgery.  2.  If you choose to wash your hair, wash your hair first as usual with your  normal  shampoo.  3.  After you shampoo, rinse your hair and body thoroughly to remove the  shampoo.                           4.  Use CHG as you would any other liquid soap.  You can apply chg directly  to the skin and wash  Gently with a scrungie or clean washcloth.  5.  Apply the CHG Soap to your body ONLY FROM THE NECK DOWN.   Do not use on face/ open                           Wound or open sores. Avoid contact with eyes, ears  mouth and genitals (private parts).                       Wash face,  Genitals (private parts) with your normal soap.             6.  Wash thoroughly, paying special attention to the area where your surgery  will be performed.  7.  Thoroughly rinse your body with warm water from the neck down.  8.  DO NOT shower/wash with your normal soap after using and rinsing off  the CHG Soap.                9.  Pat yourself dry with a clean towel.            10.  Wear clean pajamas.            11.  Place clean sheets on your bed the night of your first shower and do not  sleep with pets. Day of Surgery : Do not apply any lotions/deodorants the morning of surgery.  Please wear clean clothes to the hospital/surgery center.  FAILURE TO FOLLOW THESE INSTRUCTIONS MAY RESULT IN THE CANCELLATION OF YOUR SURGERY PATIENT SIGNATURE_________________________________  NURSE SIGNATURE__________________________________  ________________________________________________________________________    Adam Phenix  An incentive spirometer is a tool that can help keep your lungs clear and active. This tool measures how well you are filling your lungs with each breath. Taking long deep breaths may help reverse or decrease the chance of developing breathing (pulmonary) problems (especially infection) following:  A long period of time when you are unable to move or be active. BEFORE THE PROCEDURE   If the spirometer includes an indicator to show your best effort, your nurse or respiratory therapist will set it to a desired goal.  If possible, sit up straight or lean slightly forward. Try not to slouch.  Hold the incentive spirometer in an upright position. INSTRUCTIONS FOR USE  1. Sit on the edge of your bed if possible, or sit up as far as you can in bed or on a chair. 2. Hold the incentive spirometer in an upright position. 3. Breathe out normally. 4. Place the mouthpiece in your mouth and seal your lips  tightly around it. 5. Breathe in slowly and as deeply as possible, raising the piston or the ball toward the top of the column. 6. Hold your breath for 3-5 seconds or for as long as possible. Allow the piston or ball to fall to the bottom of the column. 7. Remove the mouthpiece from your mouth and breathe out normally. 8. Rest for a few seconds and repeat Steps 1 through 7 at least 10 times every 1-2 hours when you are awake. Take your time and take a few normal breaths between deep breaths. 9. The spirometer may include an indicator to show your best effort. Use the indicator as a goal to work toward during each repetition. 10. After each set of 10 deep breaths, practice coughing to be sure your lungs are clear. If you have an incision (the cut made at the time  of surgery), support your incision when coughing by placing a pillow or rolled up towels firmly against it. Once you are able to get out of bed, walk around indoors and cough well. You may stop using the incentive spirometer when instructed by your caregiver.  RISKS AND COMPLICATIONS  Take your time so you do not get dizzy or light-headed.  If you are in pain, you may need to take or ask for pain medication before doing incentive spirometry. It is harder to take a deep breath if you are having pain. AFTER USE  Rest and breathe slowly and easily.  It can be helpful to keep track of a log of your progress. Your caregiver can provide you with a simple table to help with this. If you are using the spirometer at home, follow these instructions: [place] IF:   You are having difficultly using the spirometer.  You have trouble using the spirometer as often as instructed.  Your pain medication is not giving enough relief while using the spirometer.  You develop fever of 100.5 F (38.1 C) or higher. SEEK IMMEDIATE MEDICAL CARE IF:   You cough up bloody sputum that had not been present before.  You develop fever of 102 F  (38.9 C) or greater.  You develop worsening pain at or near the incision site. MAKE SURE YOU:   Understand these instructions.  Will watch your condition.  Will get help right away if you are not doing well or get worse. Document Released: 11/20/2006 Document Revised: 10/02/2011 Document Reviewed: 01/21/2007 [facility] Patient Information 2014 [place], [place].   ________________________________________________________________________

## 2017-05-24 NOTE — Progress Notes (Signed)
Consent order does not mention type of procedure. Please place corrected consent order in epic. Patient PAT appt 05-25-17. Thank you!

## 2017-05-25 ENCOUNTER — Encounter (HOSPITAL_COMMUNITY): Payer: Self-pay

## 2017-05-25 ENCOUNTER — Encounter (HOSPITAL_COMMUNITY)
Admission: RE | Admit: 2017-05-25 | Discharge: 2017-05-25 | Disposition: A | Discharge status: Home / Self Care | Payer: Medicare Other | Source: Ambulatory Visit | Attending: Orthopaedic Surgery | Admitting: Orthopaedic Surgery

## 2017-05-25 DIAGNOSIS — I10 Essential (primary) hypertension: Secondary | ICD-10-CM | POA: Insufficient documentation

## 2017-05-25 DIAGNOSIS — Z01810 Encounter for preprocedural cardiovascular examination: Secondary | ICD-10-CM | POA: Insufficient documentation

## 2017-05-25 DIAGNOSIS — Z01812 Encounter for preprocedural laboratory examination: Secondary | ICD-10-CM | POA: Diagnosis not present

## 2017-05-25 DIAGNOSIS — R001 Bradycardia, unspecified: Secondary | ICD-10-CM | POA: Insufficient documentation

## 2017-05-25 HISTORY — DX: Prediabetes: R73.03

## 2017-05-25 LAB — BASIC METABOLIC PANEL
ANION GAP: 7 (ref 5–15)
BUN: 15 mg/dL (ref 6–20)
CALCIUM: 9.3 mg/dL (ref 8.9–10.3)
CO2: 33 mmol/L — AB (ref 22–32)
CREATININE: 0.89 mg/dL (ref 0.44–1.00)
Chloride: 99 mmol/L — ABNORMAL LOW (ref 101–111)
GFR calc Af Amer: >60 mL/min (ref >60)
GLUCOSE: 104 mg/dL — AB (ref 65–99)
Potassium: 4.2 mmol/L (ref 3.5–5.1)
Sodium: 139 mmol/L (ref 135–145)

## 2017-05-25 LAB — CBC
HCT: 38.7 % (ref 36.0–46.0)
HEMOGLOBIN: 12.8 g/dL (ref 12.0–15.0)
MCH: 27.8 pg (ref 26.0–34.0)
MCHC: 33.1 g/dL (ref 30.0–36.0)
MCV: 84.1 fL (ref 78.0–100.0)
PLATELETS: 394 10*3/uL (ref 150–400)
RBC: 4.6 MIL/uL (ref 3.87–5.11)
RDW: 13.1 % (ref 11.5–15.5)
WBC: 9.1 10*3/uL (ref 4.0–10.5)

## 2017-05-25 LAB — SURGICAL PCR SCREEN
MRSA, PCR: NEGATIVE
Staphylococcus aureus: POSITIVE — AB

## 2017-05-25 LAB — HEMOGLOBIN A1C
Hgb A1c MFr Bld: 5.8 % — ABNORMAL HIGH (ref 4.8–5.6)
MEAN PLASMA GLUCOSE: 119.76 mg/dL

## 2017-06-01 ENCOUNTER — Inpatient Hospital Stay (HOSPITAL_COMMUNITY): Payer: Medicare Other | Admitting: Certified Registered"

## 2017-06-01 ENCOUNTER — Inpatient Hospital Stay (HOSPITAL_COMMUNITY): Payer: Medicare Other

## 2017-06-01 ENCOUNTER — Encounter (HOSPITAL_COMMUNITY): Payer: Self-pay

## 2017-06-01 ENCOUNTER — Encounter (HOSPITAL_COMMUNITY)
Admission: RE | Disposition: A | Discharge status: Home Health | Payer: Self-pay | Source: Ambulatory Visit | Attending: Orthopaedic Surgery

## 2017-06-01 ENCOUNTER — Inpatient Hospital Stay (HOSPITAL_COMMUNITY)
Admission: RE | Admit: 2017-06-01 | Discharge: 2017-06-03 | DRG: 470 | Disposition: A | Discharge status: Home Health | Payer: Medicare Other | Source: Ambulatory Visit | Attending: Orthopaedic Surgery | Admitting: Orthopaedic Surgery

## 2017-06-01 ENCOUNTER — Other Ambulatory Visit: Payer: Self-pay

## 2017-06-01 DIAGNOSIS — I25118 Atherosclerotic heart disease of native coronary artery with other forms of angina pectoris: Secondary | ICD-10-CM | POA: Diagnosis not present

## 2017-06-01 DIAGNOSIS — Z96641 Presence of right artificial hip joint: Secondary | ICD-10-CM

## 2017-06-01 DIAGNOSIS — M25551 Pain in right hip: Secondary | ICD-10-CM

## 2017-06-01 DIAGNOSIS — Z96651 Presence of right artificial knee joint: Secondary | ICD-10-CM | POA: Diagnosis present

## 2017-06-01 DIAGNOSIS — Z955 Presence of coronary angioplasty implant and graft: Secondary | ICD-10-CM

## 2017-06-01 DIAGNOSIS — M1611 Unilateral primary osteoarthritis, right hip: Principal | ICD-10-CM

## 2017-06-01 DIAGNOSIS — I10 Essential (primary) hypertension: Secondary | ICD-10-CM | POA: Diagnosis present

## 2017-06-01 DIAGNOSIS — I2510 Atherosclerotic heart disease of native coronary artery without angina pectoris: Secondary | ICD-10-CM | POA: Diagnosis present

## 2017-06-01 DIAGNOSIS — E669 Obesity, unspecified: Secondary | ICD-10-CM | POA: Diagnosis present

## 2017-06-01 DIAGNOSIS — M797 Fibromyalgia: Secondary | ICD-10-CM | POA: Diagnosis present

## 2017-06-01 DIAGNOSIS — F419 Anxiety disorder, unspecified: Secondary | ICD-10-CM | POA: Diagnosis present

## 2017-06-01 DIAGNOSIS — F329 Major depressive disorder, single episode, unspecified: Secondary | ICD-10-CM | POA: Diagnosis present

## 2017-06-01 DIAGNOSIS — Z6834 Body mass index (BMI) 34.0-34.9, adult: Secondary | ICD-10-CM

## 2017-06-01 DIAGNOSIS — Z471 Aftercare following joint replacement surgery: Secondary | ICD-10-CM | POA: Diagnosis not present

## 2017-06-01 DIAGNOSIS — Z87891 Personal history of nicotine dependence: Secondary | ICD-10-CM

## 2017-06-01 DIAGNOSIS — I252 Old myocardial infarction: Secondary | ICD-10-CM

## 2017-06-01 DIAGNOSIS — E785 Hyperlipidemia, unspecified: Secondary | ICD-10-CM | POA: Diagnosis present

## 2017-06-01 DIAGNOSIS — Z888 Allergy status to other drugs, medicaments and biological substances status: Secondary | ICD-10-CM

## 2017-06-01 DIAGNOSIS — Z880 Allergy status to penicillin: Secondary | ICD-10-CM | POA: Diagnosis not present

## 2017-06-01 DIAGNOSIS — D62 Acute posthemorrhagic anemia: Secondary | ICD-10-CM | POA: Diagnosis not present

## 2017-06-01 DIAGNOSIS — Z9181 History of falling: Secondary | ICD-10-CM

## 2017-06-01 DIAGNOSIS — Z96642 Presence of left artificial hip joint: Secondary | ICD-10-CM | POA: Diagnosis present

## 2017-06-01 DIAGNOSIS — M25569 Pain in unspecified knee: Secondary | ICD-10-CM | POA: Diagnosis not present

## 2017-06-01 DIAGNOSIS — Z91048 Other nonmedicinal substance allergy status: Secondary | ICD-10-CM

## 2017-06-01 HISTORY — PX: TOTAL HIP ARTHROPLASTY: SHX124

## 2017-06-01 LAB — TYPE AND SCREEN
ABO/RH(D): A POS
ANTIBODY SCREEN: NEGATIVE

## 2017-06-01 SURGERY — ARTHROPLASTY, HIP, TOTAL, ANTERIOR APPROACH
Anesthesia: General | Site: Hip | Laterality: Right

## 2017-06-01 MED ORDER — PROPOFOL 500 MG/50ML IV EMUL
INTRAVENOUS | Status: DC | PRN
  Administered 2017-06-01: 100 ug/kg/min via INTRAVENOUS

## 2017-06-01 MED ORDER — PROPOFOL 10 MG/ML IV BOLUS
INTRAVENOUS | Status: AC
  Filled 2017-06-01: qty 20

## 2017-06-01 MED ORDER — HYDROMORPHONE HCL 1 MG/ML IJ SOLN
1.0000 mg | INTRAMUSCULAR | Status: DC | PRN
  Filled 2017-06-01: qty 1

## 2017-06-01 MED ORDER — ACETAMINOPHEN 325 MG PO TABS
650.0000 mg | ORAL_TABLET | ORAL | Status: DC | PRN
  Administered 2017-06-02: 09:00:00 650 mg via ORAL
  Filled 2017-06-01: qty 2

## 2017-06-01 MED ORDER — SODIUM CHLORIDE 0.9 % IR SOLN
Status: DC | PRN
  Administered 2017-06-01: 1000 mL

## 2017-06-01 MED ORDER — ROCURONIUM BROMIDE 50 MG/5ML IV SOSY
PREFILLED_SYRINGE | INTRAVENOUS | Status: AC
  Filled 2017-06-01: qty 5

## 2017-06-01 MED ORDER — AMLODIPINE BESYLATE 10 MG PO TABS
10.0000 mg | ORAL_TABLET | Freq: Every day | ORAL | Status: DC
  Administered 2017-06-01 – 2017-06-03 (×3): 10 mg via ORAL
  Filled 2017-06-01 (×3): qty 1

## 2017-06-01 MED ORDER — OXYCODONE HCL 5 MG PO TABS
10.0000 mg | ORAL_TABLET | ORAL | Status: DC | PRN
  Administered 2017-06-01 – 2017-06-03 (×11): 10 mg via ORAL
  Filled 2017-06-01 (×11): qty 2

## 2017-06-01 MED ORDER — DOCUSATE SODIUM 100 MG PO CAPS
100.0000 mg | ORAL_CAPSULE | Freq: Two times a day (BID) | ORAL | Status: DC
  Administered 2017-06-01 – 2017-06-03 (×4): 100 mg via ORAL
  Filled 2017-06-01 (×4): qty 1

## 2017-06-01 MED ORDER — METOCLOPRAMIDE HCL 5 MG/ML IJ SOLN: 5.0000 mg | Freq: Three times a day (TID) | INTRAMUSCULAR | Status: DC | PRN

## 2017-06-01 MED ORDER — METOCLOPRAMIDE HCL 5 MG PO TABS: 5.0000 mg | ORAL_TABLET | Freq: Three times a day (TID) | ORAL | Status: DC | PRN

## 2017-06-01 MED ORDER — TRANEXAMIC ACID 1000 MG/10ML IV SOLN
1000.0000 mg | INTRAVENOUS | Status: AC
  Administered 2017-06-01: 1000 mg via INTRAVENOUS
  Filled 2017-06-01: qty 1100

## 2017-06-01 MED ORDER — FENTANYL CITRATE (PF) 100 MCG/2ML IJ SOLN
INTRAMUSCULAR | Status: AC
  Filled 2017-06-01: qty 2

## 2017-06-01 MED ORDER — BUPIVACAINE IN DEXTROSE 0.75-8.25 % IT SOLN
INTRATHECAL | Status: DC | PRN
  Administered 2017-06-01: 2 mL via INTRATHECAL

## 2017-06-01 MED ORDER — ASPIRIN 81 MG PO CHEW
81.0000 mg | CHEWABLE_TABLET | Freq: Two times a day (BID) | ORAL | Status: DC
  Administered 2017-06-01 – 2017-06-03 (×4): 81 mg via ORAL
  Filled 2017-06-01 (×4): qty 1

## 2017-06-01 MED ORDER — LIDOCAINE 2% (20 MG/ML) 5 ML SYRINGE
INTRAMUSCULAR | Status: DC | PRN
  Administered 2017-06-01: 50 mg via INTRAVENOUS

## 2017-06-01 MED ORDER — ALPRAZOLAM 0.5 MG PO TABS
0.5000 mg | ORAL_TABLET | Freq: Three times a day (TID) | ORAL | Status: DC | PRN
  Administered 2017-06-01 – 2017-06-03 (×4): 0.5 mg via ORAL
  Filled 2017-06-01 (×5): qty 1

## 2017-06-01 MED ORDER — HYDROCODONE-ACETAMINOPHEN 5-325 MG PO TABS: 1.0000 | ORAL_TABLET | ORAL | Status: DC | PRN

## 2017-06-01 MED ORDER — METHOCARBAMOL 500 MG PO TABS
500.0000 mg | ORAL_TABLET | Freq: Four times a day (QID) | ORAL | Status: DC | PRN
  Administered 2017-06-02 – 2017-06-03 (×4): 500 mg via ORAL
  Filled 2017-06-01 (×4): qty 1

## 2017-06-01 MED ORDER — MENTHOL 3 MG MT LOZG: 1.0000 | LOZENGE | OROMUCOSAL | Status: DC | PRN

## 2017-06-01 MED ORDER — ATORVASTATIN CALCIUM 10 MG PO TABS
10.0000 mg | ORAL_TABLET | Freq: Every day | ORAL | Status: DC
  Administered 2017-06-01 – 2017-06-02 (×2): 10 mg via ORAL
  Filled 2017-06-01 (×2): qty 1

## 2017-06-01 MED ORDER — FENTANYL CITRATE (PF) 100 MCG/2ML IJ SOLN
INTRAMUSCULAR | Status: DC | PRN
  Administered 2017-06-01: 100 ug via INTRAVENOUS

## 2017-06-01 MED ORDER — CLINDAMYCIN PHOSPHATE 900 MG/50ML IV SOLN
900.0000 mg | INTRAVENOUS | Status: AC
  Administered 2017-06-01: 900 mg via INTRAVENOUS

## 2017-06-01 MED ORDER — MEPERIDINE HCL 25 MG/ML IJ SOLN: 6.2500 mg | INTRAMUSCULAR | Status: DC | PRN

## 2017-06-01 MED ORDER — VITAMIN D3 25 MCG (1000 UNIT) PO TABS
1000.0000 [IU] | ORAL_TABLET | Freq: Every day | ORAL | Status: DC
  Administered 2017-06-02 – 2017-06-03 (×2): 1000 [IU] via ORAL
  Filled 2017-06-01 (×2): qty 1

## 2017-06-01 MED ORDER — ONDANSETRON HCL 4 MG/2ML IJ SOLN
INTRAMUSCULAR | Status: DC | PRN
  Administered 2017-06-01: 4 mg via INTRAVENOUS

## 2017-06-01 MED ORDER — GABAPENTIN 400 MG PO CAPS
800.0000 mg | ORAL_CAPSULE | Freq: Two times a day (BID) | ORAL | Status: DC
  Administered 2017-06-01 – 2017-06-03 (×4): 800 mg via ORAL
  Filled 2017-06-01 (×5): qty 2

## 2017-06-01 MED ORDER — SODIUM CHLORIDE 0.9 % IV SOLN
INTRAVENOUS | Status: DC
  Administered 2017-06-01 – 2017-06-02 (×2): via INTRAVENOUS

## 2017-06-01 MED ORDER — ONDANSETRON HCL 4 MG/2ML IJ SOLN: 4.0000 mg | Freq: Four times a day (QID) | INTRAMUSCULAR | Status: DC | PRN

## 2017-06-01 MED ORDER — CEFAZOLIN SODIUM-DEXTROSE 2-4 GM/100ML-% IV SOLN
INTRAVENOUS | Status: AC
  Filled 2017-06-01: qty 100

## 2017-06-01 MED ORDER — CLINDAMYCIN PHOSPHATE 900 MG/50ML IV SOLN
INTRAVENOUS | Status: AC
  Filled 2017-06-01: qty 50

## 2017-06-01 MED ORDER — ACETAMINOPHEN 650 MG RE SUPP: 650.0000 mg | RECTAL | Status: DC | PRN

## 2017-06-01 MED ORDER — CLINDAMYCIN PHOSPHATE 600 MG/50ML IV SOLN
600.0000 mg | Freq: Four times a day (QID) | INTRAVENOUS | Status: AC
  Administered 2017-06-01 – 2017-06-02 (×2): 600 mg via INTRAVENOUS
  Filled 2017-06-01 (×3): qty 50

## 2017-06-01 MED ORDER — 0.9 % SODIUM CHLORIDE (POUR BTL) OPTIME
TOPICAL | Status: DC | PRN
  Administered 2017-06-01: 1000 mL

## 2017-06-01 MED ORDER — LIDOCAINE 2% (20 MG/ML) 5 ML SYRINGE
INTRAMUSCULAR | Status: AC
  Filled 2017-06-01: qty 5

## 2017-06-01 MED ORDER — METOPROLOL TARTRATE 50 MG PO TABS
100.0000 mg | ORAL_TABLET | Freq: Two times a day (BID) | ORAL | Status: DC
  Filled 2017-06-01: qty 2

## 2017-06-01 MED ORDER — ALUM & MAG HYDROXIDE-SIMETH 200-200-20 MG/5ML PO SUSP: 30.0000 mL | ORAL | Status: DC | PRN

## 2017-06-01 MED ORDER — CHLORHEXIDINE GLUCONATE 4 % EX LIQD: 60.0000 mL | Freq: Once | CUTANEOUS | Status: DC

## 2017-06-01 MED ORDER — EPHEDRINE SULFATE-NACL 50-0.9 MG/10ML-% IV SOSY
PREFILLED_SYRINGE | INTRAVENOUS | Status: DC | PRN
  Administered 2017-06-01 (×4): 10 mg via INTRAVENOUS

## 2017-06-01 MED ORDER — STERILE WATER FOR IRRIGATION IR SOLN
Status: DC | PRN
  Administered 2017-06-01: 2000 mL

## 2017-06-01 MED ORDER — MIDAZOLAM HCL 5 MG/5ML IJ SOLN
INTRAMUSCULAR | Status: DC | PRN
  Administered 2017-06-01: 2 mg via INTRAVENOUS

## 2017-06-01 MED ORDER — PHENOL 1.4 % MT LIQD
1.0000 | OROMUCOSAL | Status: DC | PRN
Start: 2017-06-01 — End: 2017-06-03

## 2017-06-01 MED ORDER — DEXTROSE 5 % IV SOLN
500.0000 mg | Freq: Four times a day (QID) | INTRAVENOUS | Status: DC | PRN
  Administered 2017-06-01: 500 mg via INTRAVENOUS
  Filled 2017-06-01: qty 550

## 2017-06-01 MED ORDER — MIDAZOLAM HCL 2 MG/2ML IJ SOLN
INTRAMUSCULAR | Status: AC
  Filled 2017-06-01: qty 2

## 2017-06-01 MED ORDER — LACTATED RINGERS IV SOLN
INTRAVENOUS | Status: DC
  Administered 2017-06-01: 1000 mL via INTRAVENOUS
  Administered 2017-06-01: 14:00:00 via INTRAVENOUS

## 2017-06-01 MED ORDER — FUROSEMIDE 40 MG PO TABS
40.0000 mg | ORAL_TABLET | Freq: Every day | ORAL | Status: DC
Start: 2017-06-02 — End: 2017-06-03
  Administered 2017-06-02 – 2017-06-03 (×2): 40 mg via ORAL
  Filled 2017-06-01 (×2): qty 1

## 2017-06-01 MED ORDER — ONDANSETRON HCL 4 MG PO TABS: 4.0000 mg | ORAL_TABLET | Freq: Four times a day (QID) | ORAL | Status: DC | PRN

## 2017-06-01 MED ORDER — PROMETHAZINE HCL 25 MG/ML IJ SOLN: 6.2500 mg | INTRAMUSCULAR | Status: DC | PRN

## 2017-06-01 MED ORDER — DIPHENHYDRAMINE HCL 12.5 MG/5ML PO ELIX: 12.5000 mg | ORAL_SOLUTION | ORAL | Status: DC | PRN

## 2017-06-01 MED ORDER — DEXAMETHASONE SODIUM PHOSPHATE 10 MG/ML IJ SOLN
INTRAMUSCULAR | Status: DC | PRN
  Administered 2017-06-01: 10 mg via INTRAVENOUS

## 2017-06-01 SURGICAL SUPPLY — 40 items
APL SKNCLS STERI-STRIP NONHPOA (GAUZE/BANDAGES/DRESSINGS)
BAG SPEC THK2 15X12 ZIP CLS (MISCELLANEOUS) ×1
BAG ZIPLOCK 12X15 (MISCELLANEOUS) ×2 IMPLANT
BENZOIN TINCTURE PRP APPL 2/3 (GAUZE/BANDAGES/DRESSINGS) IMPLANT
BLADE SAW SGTL 18X1.27X75 (BLADE) ×2 IMPLANT
BLADE SAW SGTL 18X1.27X75MM (BLADE) ×1
CAPT HIP TOTAL 2 ×2 IMPLANT
CELLS DAT CNTRL 66122 CELL SVR (MISCELLANEOUS) ×1 IMPLANT
CLOSURE WOUND 1/2 X4 (GAUZE/BANDAGES/DRESSINGS)
COVER PERINEAL POST (MISCELLANEOUS) ×3 IMPLANT
COVER SURGICAL LIGHT HANDLE (MISCELLANEOUS) ×3 IMPLANT
CUP SECTOR GRIPTON 50MM (Cup) ×2 IMPLANT
DRAPE STERI IOBAN 125X83 (DRAPES) ×3 IMPLANT
DRAPE U-SHAPE 47X51 STRL (DRAPES) ×6 IMPLANT
DRSG AQUACEL AG ADV 3.5X10 (GAUZE/BANDAGES/DRESSINGS) ×3 IMPLANT
DURAPREP 26ML APPLICATOR (WOUND CARE) ×3 IMPLANT
ELECT REM PT RETURN 15FT ADLT (MISCELLANEOUS) ×3 IMPLANT
GAUZE XEROFORM 1X8 LF (GAUZE/BANDAGES/DRESSINGS) ×2 IMPLANT
GLOVE BIO SURGEON STRL SZ7.5 (GLOVE) ×1 IMPLANT
GLOVE BIOGEL PI IND STRL 8 (GLOVE) ×2 IMPLANT
GLOVE BIOGEL PI INDICATOR 8 (GLOVE) ×2
GLOVE ECLIPSE 8.0 STRL XLNG CF (GLOVE) ×1 IMPLANT
GOWN STRL REUS W/TWL XL LVL3 (GOWN DISPOSABLE) ×4 IMPLANT
HANDPIECE INTERPULSE COAX TIP (DISPOSABLE) ×3
HOLDER FOLEY CATH W/STRAP (MISCELLANEOUS) ×3 IMPLANT
PACK ANTERIOR HIP CUSTOM (KITS) ×3 IMPLANT
RETRACTOR WND ALEXIS 18 MED (MISCELLANEOUS) ×1 IMPLANT
RTRCTR WOUND ALEXIS 18CM MED (MISCELLANEOUS) ×3
SET HNDPC FAN SPRY TIP SCT (DISPOSABLE) ×1 IMPLANT
STAPLER VISISTAT 35W (STAPLE) IMPLANT
STRIP CLOSURE SKIN 1/2X4 (GAUZE/BANDAGES/DRESSINGS) IMPLANT
SUT ETHIBOND NAB CT1 #1 30IN (SUTURE) ×3 IMPLANT
SUT MNCRL AB 4-0 PS2 18 (SUTURE) IMPLANT
SUT VIC AB 0 CT1 36 (SUTURE) ×3 IMPLANT
SUT VIC AB 1 CT1 36 (SUTURE) ×3 IMPLANT
SUT VIC AB 2-0 CT1 27 (SUTURE) ×6
SUT VIC AB 2-0 CT1 TAPERPNT 27 (SUTURE) ×2 IMPLANT
TRAY FOLEY BAG SILVER LF 16FR (SET/KITS/TRAYS/PACK) ×2 IMPLANT
TRAY FOLEY W/METER SILVER 16FR (SET/KITS/TRAYS/PACK) ×1 IMPLANT
YANKAUER SUCT BULB TIP 10FT TU (MISCELLANEOUS) ×3 IMPLANT

## 2017-06-01 NOTE — Brief Op Note (Signed)
06/01/2017  2:57 PM  PATIENT:  [name]  66 y.o. female  PRE-OPERATIVE DIAGNOSIS:  osteoarthritis right hip  POST-OPERATIVE DIAGNOSIS:  osteoarthritis right hip  PROCEDURE:  Procedure(s) with comments: RIGHT TOTAL HIP ARTHROPLASTY ANTERIOR APPROACH (Right) - Needs RNFA  SURGEON:  Surgeon(s) and Role:    Mcarthur Rossetti, MD - Primary  ANESTHESIA:   spinal  EBL:  100 mL   COUNTS:  YES  DICTATION: .Other Dictation: Dictation Number [id number]  PLAN OF CARE: Admit to inpatient   PATIENT DISPOSITION:  PACU - hemodynamically stable.   Delay start of Pharmacological VTE agent (>24hrs) due to surgical blood loss or risk of bleeding: no

## 2017-06-01 NOTE — Anesthesia Procedure Notes (Addendum)
Spinal  Patient location during procedure: OR End time: 06/01/2017 1:01 PM Staffing Resident/CRNA: Noralyn Pick D, CRNA Performed: resident/CRNA  Preanesthetic Checklist Completed: patient identified, site marked, surgical consent, pre-op evaluation, timeout performed, IV checked, risks and benefits discussed and monitors and equipment checked Spinal Block Patient position: sitting Prep: Betadine, ChloraPrep and Betadine was applied to patient prior to [place] entering OR. RN in the OR noted that the patient has an allergy to iodine. Area was washed thourghoutly and site reprepped with CHG. Patient monitoring: heart rate, continuous pulse ox and blood pressure Approach: midline Location: L2-3 Injection technique: single-shot Needle Needle type: Sprotte  Needle gauge: 24 G Needle length: 9 cm Assessment Sensory level: T6 Additional Notes Expiration date of kit checked and confirmed. Patient tolerated procedure well, without complications.

## 2017-06-01 NOTE — Anesthesia Preprocedure Evaluation (Addendum)
Anesthesia Evaluation  Patient identified by MRN, date of birth, ID band Patient awake    Reviewed: Allergy & Precautions, NPO status , Patient's Chart, lab work & pertinent test results, reviewed documented beta blocker date and time   Airway Mallampati: II  TM Distance: >3 FB Neck ROM: Full    Dental no notable dental hx. (+) Lower Dentures, Partial Upper, Dental Advisory Given   Pulmonary Current Smoker, former smoker,    Pulmonary exam normal breath sounds clear to auscultation       Cardiovascular hypertension, Pt. on home beta blockers and Pt. on medications (-) angina+ CAD, + Past MI and + Cardiac Stents (x 1. Placed in 2004)  Normal cardiovascular exam Rhythm:Regular Rate:Bradycardia  ECG: SB, rate 49  ECHO:04/27/15 [facility]):  1. The study quality is fair. 2. LV chamber size is normal. Global LV wall motion and contractility are within normal limits. Estimated EF 60-65%. 3. No old study for comparison.   Sees cardiologist Domenic Polite)  There was no ST segment deviation noted during stress. Findings consistent with prior antoerlateral myocardial infarction with mild peri-infarct ischemia. This is a low risk study. The left ventricular ejection fraction is normal (55-65%).   Neuro/Psych  Headaches, PSYCHIATRIC DISORDERS Anxiety Depression    GI/Hepatic negative GI ROS, Neg liver ROS,   Endo/Other  negative endocrine ROS  Renal/GU negative Renal ROS  negative genitourinary   Musculoskeletal  (+) Arthritis , Fibromyalgia -  Abdominal (+) + obese,   Peds negative pediatric ROS (+)  Hematology negative hematology ROS (+)   Anesthesia Other Findings Hyperlipidemia  Obese  Negative myocardial perfusion scan at [facility]  Reproductive/Obstetrics negative OB ROS                            Anesthesia Physical  Anesthesia Plan  ASA: III  Anesthesia Plan: Spinal   Post-op  Pain Management:    Induction: Intravenous  PONV Risk Score and Plan: Ondansetron, Dexamethasone, Propofol and Midazolam  Airway Management Planned:   Additional Equipment:   Intra-op Plan:   Post-operative Plan:   Informed Consent: I have reviewed the patients History and Physical, chart, labs and discussed the procedure including the risks, benefits and alternatives for the proposed anesthesia with the patient or authorized representative who has indicated his/her understanding and acceptance.   Dental advisory given  Plan Discussed with: CRNA  Anesthesia Plan Comments:        Anesthesia Quick Evaluation

## 2017-06-01 NOTE — Anesthesia Procedure Notes (Deleted)
Anesthesia Regional Block: Narrative:       

## 2017-06-01 NOTE — Transfer of Care (Signed)
Immediate Anesthesia Transfer of Care Note  Patient: [name]  Procedure(s) Performed: RIGHT TOTAL HIP ARTHROPLASTY ANTERIOR APPROACH (Right Hip)  Patient Location: PACU  Anesthesia Type:Regional  Level of Consciousness: awake, alert  and oriented  Airway & Oxygen Therapy: Patient Spontanous Breathing and Patient connected to face mask oxygen  Post-op Assessment: Report given to RN and Post -op Vital signs reviewed and stable  Post vital signs: Reviewed and stable  Last Vitals:  Vitals:   06/01/17 0954  BP: (!) 151/53  Pulse: 60  Resp: 18  Temp: 36.7 C  SpO2: 98%    Last Pain:  Vitals:   06/01/17 0954  TempSrc: Oral      Patients Stated Pain Goal: 4 (08/81/10 3159)  Complications: No apparent anesthesia complications

## 2017-06-01 NOTE — H&P (Signed)
TOTAL HIP ADMISSION H&P  Patient is admitted for right total hip arthroplasty.  Subjective:  Chief Complaint: right hip pain  HPI: [name], 66 y.o. female, has a history of pain and functional disability in the right hip(s) due to arthritis and patient has failed non-surgical conservative treatments for greater than 12 weeks to include NSAID's and/or analgesics, corticosteriod injections, flexibility and strengthening excercises, use of assistive devices, weight reduction as appropriate and activity modification.  Onset of symptoms was gradual starting 2 years ago with gradually worsening course since that time.The patient noted no past surgery on the right hip(s).  Patient currently rates pain in the right hip at 9 out of 10 with activity. Patient has night pain, worsening of pain with activity and weight bearing, trendelenberg gait, pain that interfers with activities of daily living and pain with passive range of motion. Patient has evidence of subchondral cysts, subchondral sclerosis, periarticular osteophytes and joint space narrowing by imaging studies. This condition presents safety issues increasing the risk of falls.  There is no current active infection.  Patient Active Problem List   Diagnosis Date Noted  . Unilateral primary osteoarthritis, right hip 06/01/2017  . Spondylolisthesis at L4-L5 level 01/30/2017  . Bruit 01/11/2017  . Preop cardiovascular exam 01/11/2017  . Coronary artery disease involving native heart with angina pectoris ([place]) 01/11/2017  . TENDINITIS, ELBOW 03/18/2008  . TOTAL KNEE FOLLOW-UP 09/24/2007  . Osteoarthrosis, unspecified whether generalized or localized, lower leg 04/12/2007  . KNEE PAIN 04/12/2007   Past Medical History:  Diagnosis Date  . Anxiety   . Arthritis   . Complication of anesthesia    "don't wake up easily"  . Coronary artery disease    LAD Cypher stent with PCI of the diagonal in 2004.    . Depression   . Dyslipidemia   .  Fibromyalgia   . Headache    history of migraines  . Hypertension   . Myocardial infarction [facility])    see stress stest result; patient denies   . Pre-diabetes    "ive been told i am  borderline"     Past Surgical History:  Procedure Laterality Date  . ABDOMINAL HYSTERECTOMY     partial first, then full  . BACK SURGERY     01-30-2017 [facility]   . CARDIAC CATHETERIZATION    . CARPAL TUNNEL RELEASE Right   . CHOLECYSTECTOMY    . COLONOSCOPY    . CORONARY ANGIOPLASTY  2004   stent placed x1   . REPLACEMENT TOTAL KNEE Right   . SHOULDER ARTHROSCOPY Right   . TOTAL HIP ARTHROPLASTY Left   . TUBAL LIGATION      Current Facility-Administered Medications  Medication Dose Route Frequency Provider Last Rate Last Dose  . ceFAZolin (ANCEF) 2-4 GM/100ML-% IVPB           . chlorhexidine (HIBICLENS) 4 % liquid 4 application  60 mL Topical Once Erskine Emery W, PA-C      . clindamycin (CLEOCIN) IVPB 900 mg  900 mg Intravenous On Call to OR Pete Pelt, PA-C      . lactated ringers infusion   Intravenous Continuous Nolon Nations, MD 100 mL/hr at 06/01/17 1041 1,000 mL at 06/01/17 1041  . tranexamic acid (CYKLOKAPRON) 1,000 mg in sodium chloride 0.9 % 100 mL IVPB  1,000 mg Intravenous To OR Pete Pelt, PA-C       Allergies  Allergen Reactions  . Iodine Swelling    Red spots on skin  after cath, swollen lips and eyes  . Adhesive [Tape] Dermatitis    Plastic tape causes skin irritation   . Niacin Rash    MAY BE PREDICTABLE RESPONSE TO NIACIN  . Penicillins Rash    Social History   Tobacco Use  . Smoking status: Former Smoker    Packs/day: 0.75    Years: 45.00    Pack years: 33.75    Types: Cigarettes    Last attempt to quit: 01/30/2017    Years since quitting: 0.3  . Smokeless tobacco: Never Used  Substance Use Topics  . Alcohol use: Yes    Comment: occasional glass of wine    Family History  Problem Relation Age of Onset  . Heart attack Father 64  . Cancer Brother         Lung     Review of Systems  Musculoskeletal: Positive for joint pain.  All other systems reviewed and are negative.   Objective:  Physical Exam  Constitutional: She appears well-developed and well-nourished.  HENT:  Head: Normocephalic and atraumatic.  Eyes: EOM are normal. Pupils are equal, round, and reactive to light.  Neck: Normal range of motion. Neck supple.  Cardiovascular: Normal rate.  Respiratory: Effort normal and breath sounds normal.  GI: Soft. Bowel sounds are normal.  Musculoskeletal:       Right hip: She exhibits decreased range of motion, decreased strength, tenderness and bony tenderness.  Neurological: She is alert.  Skin: Skin is warm and dry.  Psychiatric: She has a normal mood and affect.    Vital signs in last 24 hours: Temp:  [98 F (36.7 C)] 98 F (36.7 C) (11/09 0954) Pulse Rate:  [60] 60 (11/09 0954) Resp:  [18] 18 (11/09 0954) BP: (151)/(53) 151/53 (11/09 0954) SpO2:  [98 %] 98 % (11/09 0954) Weight:  [186 lb (84.4 kg)] 186 lb (84.4 kg) (11/09 1027)  Labs:   Estimated body mass index is 34.02 kg/m as calculated from the following:   Height as of this encounter: 5\' 2"  (1.575 m).   Weight as of this encounter: 186 lb (84.4 kg).   Imaging Review Plain radiographs demonstrate severe degenerative joint disease of the right hip(s). The bone quality appears to be good for age and reported activity level.  Assessment/Plan:  End stage arthritis, right hip(s)  The patient history, physical examination, clinical judgement of the provider and imaging studies are consistent with end stage degenerative joint disease of the right hip(s) and total hip arthroplasty is deemed medically necessary. The treatment options including medical management, injection therapy, arthroscopy and arthroplasty were discussed at length. The risks and benefits of total hip arthroplasty were presented and reviewed. The risks due to aseptic loosening, infection,  stiffness, dislocation/subluxation,  thromboembolic complications and other imponderables were discussed.  The patient acknowledged the explanation, agreed to proceed with the plan and consent was signed. Patient is being admitted for inpatient treatment for surgery, pain control, PT, OT, prophylactic antibiotics, VTE prophylaxis, progressive ambulation and ADL's and discharge planning.The patient is planning to be discharged home with home health services

## 2017-06-01 NOTE — Anesthesia Postprocedure Evaluation (Addendum)
Anesthesia Post Note  Patient: [name]  Procedure(s) Performed: RIGHT TOTAL HIP ARTHROPLASTY ANTERIOR APPROACH (Right Hip)     Patient location during evaluation: PACU Anesthesia Type: Spinal and MAC Level of consciousness: awake and alert Pain management: pain level controlled Vital Signs Assessment: post-procedure vital signs reviewed and stable Respiratory status: spontaneous breathing and respiratory function stable Cardiovascular status: blood pressure returned to baseline and stable Postop Assessment: spinal receding Anesthetic complications: no    Last Vitals:  Vitals:   06/01/17 1902 06/01/17 2046  BP: (!) 145/45 (!) 139/46  Pulse: 66 65  Resp: 18 15  Temp:  36.6 C  SpO2: 100% 100%    Last Pain:  Vitals:   06/01/17 2046  TempSrc: Oral  PainSc:                  Nolon Nations

## 2017-06-02 LAB — BASIC METABOLIC PANEL
Anion gap: 6 (ref 5–15)
BUN: 11 mg/dL (ref 6–20)
CALCIUM: 8.7 mg/dL — AB (ref 8.9–10.3)
CO2: 28 mmol/L (ref 22–32)
Chloride: 104 mmol/L (ref 101–111)
Creatinine, Ser: 1.05 mg/dL — ABNORMAL HIGH (ref 0.44–1.00)
GFR calc Af Amer: >60 mL/min (ref >60)
GFR, EST NON AFRICAN AMERICAN: 53 mL/min — AB (ref >60)
GLUCOSE: 164 mg/dL — AB (ref 65–99)
POTASSIUM: 3.9 mmol/L (ref 3.5–5.1)
SODIUM: 138 mmol/L (ref 135–145)

## 2017-06-02 LAB — CBC
HCT: 32.4 % — ABNORMAL LOW (ref 36.0–46.0)
Hemoglobin: 10.7 g/dL — ABNORMAL LOW (ref 12.0–15.0)
MCH: 27.6 pg (ref 26.0–34.0)
MCHC: 33 g/dL (ref 30.0–36.0)
MCV: 83.7 fL (ref 78.0–100.0)
PLATELETS: 274 10*3/uL (ref 150–400)
RBC: 3.87 MIL/uL (ref 3.87–5.11)
RDW: 13.2 % (ref 11.5–15.5)
WBC: 13.2 10*3/uL — AB (ref 4.0–10.5)

## 2017-06-02 MED ORDER — OXYCODONE-ACETAMINOPHEN 5-325 MG PO TABS: 1.0000 | ORAL_TABLET | ORAL | 0 refills | Status: DC | PRN

## 2017-06-02 MED ORDER — ASPIRIN EC 81 MG PO TBEC: 81.0000 mg | DELAYED_RELEASE_TABLET | Freq: Two times a day (BID) | ORAL | 0 refills | Status: DC

## 2017-06-02 MED ORDER — METOPROLOL TARTRATE 50 MG PO TABS
50.0000 mg | ORAL_TABLET | Freq: Every day | ORAL | Status: DC
  Administered 2017-06-03: 10:00:00 50 mg via ORAL
  Filled 2017-06-02: qty 1

## 2017-06-02 MED ORDER — ZOLPIDEM TARTRATE 5 MG PO TABS
5.0000 mg | ORAL_TABLET | Freq: Every day | ORAL | Status: DC
  Administered 2017-06-02: 22:00:00 5 mg via ORAL
  Filled 2017-06-02: qty 1

## 2017-06-02 MED ORDER — METHOCARBAMOL 500 MG PO TABS: 500.0000 mg | ORAL_TABLET | Freq: Four times a day (QID) | ORAL | 3 refills | Status: DC | PRN

## 2017-06-02 NOTE — Evaluation (Signed)
Physical Therapy Evaluation Patient Details Name: [name] MRN: [id number] DOB: 09-19-1950 Today's Date: 06/02/2017   History of Present Illness  66 y/o female s/p R THA anterior approach   Clinical Impression  Pt s/p R THR and presents with decreased R LE strength/ROM and post op pain limiting functional mobility.  Pt should progress to dc home with family assist and HHPT follow up.    Follow Up Recommendations Home health PT    Equipment Recommendations  None recommended by PT    Recommendations for Other Services OT consult     Precautions / Restrictions Precautions Precautions: Fall Restrictions Weight Bearing Restrictions: No Other Position/Activity Restrictions: WBAT       Mobility  Bed Mobility Overal bed mobility: Needs Assistance Bed Mobility: Supine to Sit     Supine to sit: Mod assist;+2 for safety/equipment;HOB elevated     General bed mobility comments: assist for advancing RLE over EOB, light assist to bring trunk into upright position   Transfers Overall transfer level: Needs assistance Equipment used: Rolling walker (2 wheeled) Transfers: Sit to/from Stand Sit to Stand: Min assist;+2 physical assistance;From elevated surface         General transfer comment: assist to rise and steady; verbal cues for hand/LE placment  Ambulation/Gait Ambulation/Gait assistance: Min assist;+2 safety/equipment Ambulation Distance (Feet): 18 Feet Assistive device: Rolling walker (2 wheeled) Gait Pattern/deviations: Step-to pattern;Decreased step length - right;Decreased step length - left;Shuffle;Trunk flexed Gait velocity: decr Gait velocity interpretation: Below normal speed for age/gender General Gait Details: cues for posture, sequence and position from [employer]            Wheelchair Mobility    Modified Rankin (Stroke Patients Only)       Balance Overall balance assessment: Needs assistance Sitting-balance support: Feet supported;No  upper extremity supported Sitting balance-Leahy Scale: Fair     Standing balance support: Bilateral upper extremity supported Standing balance-Leahy Scale: Poor Standing balance comment: reliant on UE support during mobility                              Pertinent Vitals/Pain Pain Assessment: Faces Faces Pain Scale: Hurts little more Pain Location: R LE Pain Descriptors / Indicators: Grimacing;Operative site guarding Pain Intervention(s): Limited activity within patient's tolerance;Monitored during session;Premedicated before session    Home Living Family/patient expects to be discharged to:: Private residence Living Arrangements: Spouse/significant other Available Help at Discharge: Family;Available 24 hours/day Type of Home: House Home Access: Stairs to enter Entrance Stairs-Rails: Right Entrance Stairs-Number of Steps: 4 Home Layout: Two level Home Equipment: Walker - 2 wheels;Cane - single point;Bedside commode;Shower seat;Cane - quad Additional Comments: mother granddaughter and great granddaughter in addition to spouse are availabel to (A)    Prior Function Level of Independence: Needs assistance   Gait / Transfers Assistance Needed: alternating between [facility] and RW for mobility  ADL's / Homemaking Assistance Needed: Husband providing intermittent MinA for ADLs  Comments: Pt reports using the walker PTA due to pain     Hand Dominance   Dominant Hand: Right    Extremity/Trunk Assessment   Upper Extremity Assessment Upper Extremity Assessment: Generalized weakness    Lower Extremity Assessment Lower Extremity Assessment: RLE deficits/detail       Communication   Communication: No difficulties  Cognition Arousal/Alertness: Awake/alert Behavior During Therapy: WFL for tasks assessed/performed Overall Cognitive Status: Within Functional Limits for tasks assessed  General Comments       Exercises     Assessment/Plan    PT Assessment Patient needs continued PT services  PT Problem List Decreased strength;Decreased range of motion;Decreased activity tolerance;Decreased mobility;Obesity;Pain;Decreased knowledge of use of DME       PT Treatment Interventions DME instruction;Gait training;Stair training;Functional mobility training;Therapeutic activities;Therapeutic exercise;Patient/family education    PT Goals (Current goals can be found in the Care Plan section)  Acute Rehab PT Goals Patient Stated Goal: return home  PT Goal Formulation: With patient Time For Goal Achievement: 06/09/17 Potential to Achieve Goals: Good    Frequency 7X/week   Barriers to discharge        Co-evaluation   Reason for Co-Treatment: For patient/therapist safety PT goals addressed during session: Mobility/safety with mobility OT goals addressed during session: ADL's and self-care       AM-PAC PT "6 Clicks" Daily Activity  Outcome Measure Difficulty turning over in bed (including adjusting bedclothes, sheets and blankets)?: Unable Difficulty moving from lying on back to sitting on the side of the bed? : Unable Difficulty sitting down on and standing up from a chair with arms (e.g., wheelchair, bedside commode, etc,.)?: Unable Help needed moving to and from a bed to chair (including a wheelchair)?: A Lot Help needed walking in hospital room?: A Lot Help needed climbing 3-5 steps with a railing? : A Lot 6 Click Score: 9    End of Session Equipment Utilized During Treatment: Gait belt Activity Tolerance: Patient tolerated treatment well Patient left: in chair;with call bell/phone within reach Nurse Communication: Mobility status PT Visit Diagnosis: Unsteadiness on feet (R26.81);History of falling (Z91.81)    Time: 1583-0940 PT Time Calculation (min) (ACUTE ONLY): 32 min   Charges:   PT Evaluation $PT Eval Low Complexity: 1 Low     PT G Codes:        Pg [phone]  1103   Gabrielle Mester 06/02/2017, 1:32 PM

## 2017-06-02 NOTE — Progress Notes (Signed)
   Subjective:  Patient reports pain as moderate.    Objective:   VITALS:   Vitals:   06/01/17 1902 06/01/17 2046 06/02/17 0215 06/02/17 0653  BP: (!) 145/45 (!) 139/46 (!) 105/58 (!) 153/50  Pulse: 66 65 62 68  Resp: 18 15 14 15   Temp:  97.8 F (36.6 C) 97.7 F (36.5 C) (!) 97.5 F (36.4 C)  TempSrc: Oral Oral Oral Oral  SpO2: 100% 100% 100% 98%  Weight:      Height:        Neurologically intact Neurovascular intact Sensation intact distally Intact pulses distally Dorsiflexion/Plantar flexion intact Incision: dressing C/D/I and no drainage No cellulitis present Compartment soft   Lab Results  Component Value Date   WBC 13.2 (H) 06/02/2017   HGB 10.7 (L) 06/02/2017   HCT 32.4 (L) 06/02/2017   MCV 83.7 06/02/2017   PLT 274 06/02/2017     Assessment/Plan:  1 Day Post-Op   - Expected postop acute blood loss anemia - will monitor for symptoms - Up with PT/OT - DVT ppx - SCDs, ambulation, aspirin - WBAT operative extremity - Pain control - Discharge planning  Eduard Roux 06/02/2017, 8:07 AM [phone]

## 2017-06-02 NOTE — Progress Notes (Signed)
Physical Therapy Treatment Patient Details Name: [name] MRN: [id number] DOB: [birth year] Today's Date: 06/02/2017    History of Present Illness 66 y/o female s/p R THA anterior approach     PT Comments    Pt requiring increased time for all tasks but slowly progressing with mobility.   Follow Up Recommendations  Home health PT     Equipment Recommendations  None recommended by PT    Recommendations for Other Services OT consult     Precautions / Restrictions Precautions Precautions: Fall Restrictions Weight Bearing Restrictions: No Other Position/Activity Restrictions: WBAT     Mobility  Bed Mobility Overal bed mobility: Needs Assistance Bed Mobility: Sit to Supine     Supine to sit: Mod assist;+2 for safety/equipment;HOB elevated Sit to supine: Min assist;Mod assist   General bed mobility comments: assist with Bil LEs  Transfers Overall transfer level: Needs assistance Equipment used: Rolling walker (2 wheeled) Transfers: Sit to/from Stand Sit to Stand: Min assist         General transfer comment: assist to rise and steady; verbal cues for hand/LE placment  Ambulation/Gait Ambulation/Gait assistance: Min assist Ambulation Distance (Feet): 80 Feet Assistive device: Rolling walker (2 wheeled) Gait Pattern/deviations: Step-to pattern;Decreased step length - right;Decreased step length - left;Shuffle;Trunk flexed Gait velocity: decr Gait velocity interpretation: Below normal speed for age/gender General Gait Details: cues for posture, sequence and position from [employer]            Wheelchair Mobility    Modified Rankin (Stroke Patients Only)       Balance Overall balance assessment: Needs assistance Sitting-balance support: Feet supported;No upper extremity supported Sitting balance-Leahy Scale: Good     Standing balance support: No upper extremity supported Standing balance-Leahy Scale: Fair Standing balance comment: reliant on  UE support during mobility                             Cognition Arousal/Alertness: Awake/alert Behavior During Therapy: WFL for tasks assessed/performed Overall Cognitive Status: Within Functional Limits for tasks assessed                                        Exercises Total Joint Exercises Ankle Circles/Pumps: AROM;Both;20 reps;Supine Quad Sets: AROM;Both;10 reps;Supine Heel Slides: AAROM;Right;15 reps;Supine Hip ABduction/ADduction: AAROM;Right;15 reps;Supine    General Comments        Pertinent Vitals/Pain Pain Assessment: Faces Faces Pain Scale: Hurts little more Pain Location: R LE Pain Descriptors / Indicators: Grimacing;Operative site guarding Pain Intervention(s): Limited activity within patient's tolerance;Monitored during session;Premedicated before session;Ice applied    Home Living Family/patient expects to be discharged to:: Private residence Living Arrangements: Spouse/significant other Available Help at Discharge: Family;Available 24 hours/day Type of Home: House Home Access: Stairs to enter Entrance Stairs-Rails: Right Home Layout: Two level Home Equipment: Walker - 2 wheels;Cane - single point;Bedside commode;Shower seat;Cane - quad Additional Comments: mother granddaughter and great granddaughter in addition to spouse are availabel to (A)    Prior Function Level of Independence: Needs assistance  Gait / Transfers Assistance Needed: alternating between [facility] and RW for mobility ADL's / Homemaking Assistance Needed: Husband providing intermittent MinA for ADLs Comments: Pt reports using the walker PTA due to pain   PT Goals (current goals can now be found in the care plan section) Acute Rehab PT Goals Patient Stated Goal: return  home  PT Goal Formulation: With patient Time For Goal Achievement: 06/09/17 Potential to Achieve Goals: Good Progress towards PT goals: Progressing toward goals    Frequency    7X/week       PT Plan Current plan remains appropriate    Co-evaluation   Reason for Co-Treatment: For patient/therapist safety PT goals addressed during session: Mobility/safety with mobility OT goals addressed during session: ADL's and self-care      AM-PAC PT "6 Clicks" Daily Activity  Outcome Measure  Difficulty turning over in bed (including adjusting bedclothes, sheets and blankets)?: Unable Difficulty moving from lying on back to sitting on the side of the bed? : Unable Difficulty sitting down on and standing up from a chair with arms (e.g., wheelchair, bedside commode, etc,.)?: Unable Help needed moving to and from a bed to chair (including a wheelchair)?: A Lot Help needed walking in hospital room?: A Little Help needed climbing 3-5 steps with a railing? : A Lot 6 Click Score: 10    End of Session Equipment Utilized During Treatment: Gait belt Activity Tolerance: Patient tolerated treatment well Patient left: with call bell/phone within reach;in bed Nurse Communication: Mobility status PT Visit Diagnosis: Unsteadiness on feet (R26.81);History of falling (Z91.81)     Time: 9678-9381 PT Time Calculation (min) (ACUTE ONLY): 49 min  Charges:  $Gait Training: 23-37 mins $Therapeutic Exercise: 8-22 mins                    G Codes:       Pg [phone]    Brian Kocourek 06/02/2017, 3:53 PM

## 2017-06-02 NOTE — Evaluation (Signed)
Occupational Therapy Evaluation Patient Details Name: [name] MRN: [id number] DOB: July 23, 1951 Today's Date: 06/02/2017    History of Present Illness 66 y/o female s/p R THA anterior approach    Clinical Impression   This 66 y/o F presents with the above. At baseline Pt is mod independent with functional mobility using SPC/RW, receiving intermittent MinA from spouse for LB ADLs. Pt completed functional mobility at RW level with MinA +2 during this session; currently requires MaxA for LB ADLs. Pt will return home with family assist for ADLs PRN. Will continue to follow acutely to maximize Pt's safety and independence with ADLs and mobility prior to return home.     Follow Up Recommendations  DC plan and follow up therapy as arranged by surgeon;Supervision/Assistance - 24 hour    Equipment Recommendations  None recommended by OT    Recommendations for Other Services       Precautions / Restrictions Precautions Precautions: Fall Restrictions Weight Bearing Restrictions: No Other Position/Activity Restrictions: WBAT       Mobility Bed Mobility Overal bed mobility: Needs Assistance Bed Mobility: Supine to Sit     Supine to sit: Mod assist;+2 for safety/equipment     General bed mobility comments: assist for advancing RLE over EOB, light assist to bring trunk into upright position   Transfers Overall transfer level: Needs assistance Equipment used: Rolling walker (2 wheeled) Transfers: Sit to/from Stand Sit to Stand: Min assist;+2 physical assistance;From elevated surface         General transfer comment: assist to rise and steady; verbal cues for hand/LE placment    Balance Overall balance assessment: Needs assistance Sitting-balance support: Feet supported;No upper extremity supported Sitting balance-Leahy Scale: Fair     Standing balance support: Bilateral upper extremity supported Standing balance-Leahy Scale: Poor Standing balance comment: reliant on UE  support during mobility                            ADL either performed or assessed with clinical judgement   ADL Overall ADL's : Needs assistance/impaired Eating/Feeding: Set up;Sitting   Grooming: Wash/dry face;Set up;Sitting   Upper Body Bathing: Min guard;Sitting   Lower Body Bathing: Moderate assistance;Sit to/from stand   Upper Body Dressing : Minimal assistance;Sitting;Bed level Upper Body Dressing Details (indicate cue type and reason): doffing/donning new gown Lower Body Dressing: Maximal assistance;Sit to/from stand;+2 for safety/equipment   Toilet Transfer: Minimal assistance;Ambulation;BSC;RW Toilet Transfer Details (indicate cue type and reason): BSC over toilet; simulated through transfer to [place] and Hygiene: Sit to/from stand;Moderate assistance       Functional mobility during ADLs: Minimal assistance;+2 for safety/equipment;Rolling walker                           Pertinent Vitals/Pain Pain Assessment: Faces Faces Pain Scale: Hurts little more Pain Location: R LE Pain Descriptors / Indicators: Grimacing;Operative site guarding Pain Intervention(s): Limited activity within patient's tolerance;Monitored during session;Premedicated before session          Extremity/Trunk Assessment Upper Extremity Assessment Upper Extremity Assessment: Generalized weakness   Lower Extremity Assessment Lower Extremity Assessment: Defer to PT evaluation       Communication Communication Communication: No difficulties   Cognition Arousal/Alertness: Awake/alert Behavior During Therapy: WFL for tasks assessed/performed Overall Cognitive Status: Within Functional Limits for tasks assessed  Home Living Family/patient expects to be discharged to:: Private residence Living Arrangements: Spouse/significant other Available Help at Discharge:  Family;Available 24 hours/day Type of Home: House Home Access: Stairs to enter           [employer] Shower/Tub: Occupational psychologist: Standard     Home Equipment: Environmental consultant - 2 wheels;Cane - single point;Bedside commode;Shower seat          Prior Functioning/Environment Level of Independence: Needs assistance  Gait / Transfers Assistance Needed: alternating between [facility] and RW for mobility ADL's / Homemaking Assistance Needed: Husband providing intermittent MinA for ADLs                    OT Treatment/Interventions: Self-care/ADL training;Therapeutic activities;Balance training;DME and/or AE instruction;Therapeutic exercise;Energy conservation;Patient/family education    OT Goals(Current goals can be found in the care plan section) Acute Rehab OT Goals Patient Stated Goal: return home  OT Goal Formulation: With patient Time For Goal Achievement: 06/16/17 Potential to Achieve Goals: Good  OT Frequency: Min 2X/week               Co-evaluation PT/OT/SLP Co-Evaluation/Treatment: Yes Reason for Co-Treatment: For patient/therapist safety;To address functional/ADL transfers PT goals addressed during session: Mobility/safety with mobility;Proper use of DME;Balance OT goals addressed during session: ADL's and self-care;Proper use of Adaptive equipment and DME      AM-PAC PT "6 Clicks" Daily Activity     Outcome Measure Help from another person eating meals?: None Help from another person taking care of personal grooming?: A Little Help from another person toileting, which includes using toliet, bedpan, or urinal?: A Lot Help from another person bathing (including washing, rinsing, drying)?: A Lot Help from another person to put on and taking off regular upper body clothing?: A Little Help from another person to put on and taking off regular lower body clothing?: A Lot 6 Click Score: 16   End of Session Equipment Utilized During Treatment: Gait belt;Rolling  walker Nurse Communication: Mobility status  Activity Tolerance: Patient tolerated treatment well Patient left: in chair;with call bell/phone within reach  OT Visit Diagnosis: Unsteadiness on feet (R26.81);Other abnormalities of gait and mobility (R26.89);Pain Pain - Right/Left: Right Pain - part of body: Hip                Time: 1200-1232 OT Time Calculation (min): 32 min Charges:  OT General Charges $OT Visit: 1 Visit OT Evaluation $OT Eval Moderate Complexity: 1 Mod G-Codes:     Lou Cal, OT Pager [phone] 06/02/2017   Raymondo Band 06/02/2017, 1:03 PM

## 2017-06-02 NOTE — Discharge Instructions (Signed)

## 2017-06-02 NOTE — Care Management Note (Signed)
Case Management Note  Patient Details  Name: [name] MRN: [id number] Date of Birth: [birth year]  Subjective/Objective:      Right THA              Action/Plan: Discharge Planning: NCM spoke to pt and offered choice for HH/list provided. Pt agreeable to [facility] for [facility]. Pt states husband at home to assist with care. Has RW, rolling walker with seat, bath seat, and bedside commode at home.    Expected Discharge Date:  06/03/2017              Expected Discharge Plan:  [place]  In-House Referral:  NA  Discharge planning Services  CM Consult  Post Acute Care Choice:  Home Health Choice offered to:  Patient  DME Arranged:  N/A DME Agency:  NA  HH Arranged:  PT [place] Agency:  [facility] (formerly [employer])  Status of Service:  Completed, signed off  If discussed at [employer], dates discussed:    Additional Comments:  Erenest Rasher, RN 06/02/2017, 11:31 AM

## 2017-06-03 MED ORDER — ASPIRIN EC 81 MG PO TBEC: 81.0000 mg | DELAYED_RELEASE_TABLET | Freq: Two times a day (BID) | ORAL | 0 refills | Status: DC

## 2017-06-03 MED ORDER — METHOCARBAMOL 500 MG PO TABS: 500.0000 mg | ORAL_TABLET | Freq: Four times a day (QID) | ORAL | 3 refills | Status: DC | PRN

## 2017-06-03 NOTE — Progress Notes (Signed)
Physical Therapy Treatment Patient Details Name: [name] MRN: [id number] DOB: 1951-06-29 Today's Date: 06/03/2017    History of Present Illness 66 y/o female s/p R THA anterior approach     PT Comments    Pt cooperative and progressing steadily with mobility including negotiating stairs with min difficulty.  Pt states eager to sleep in her own bed this evening.   Follow Up Recommendations  Home health PT     Equipment Recommendations  None recommended by PT    Recommendations for Other Services OT consult     Precautions / Restrictions Precautions Precautions: Fall Restrictions Weight Bearing Restrictions: No Other Position/Activity Restrictions: WBAT     Mobility  Bed Mobility               General bed mobility comments: Pt up in chair and returned to same  Transfers Overall transfer level: Needs assistance Equipment used: Rolling walker (2 wheeled) Transfers: Sit to/from Stand Sit to Stand: Min guard;Supervision         General transfer comment: cues for hand placement and LE management.  Ambulation/Gait Ambulation/Gait assistance: Min guard;Supervision Ambulation Distance (Feet): 65 Feet Assistive device: Rolling walker (2 wheeled) Gait Pattern/deviations: Step-to pattern;Decreased step length - right;Decreased step length - left;Shuffle;Trunk flexed Gait velocity: decr Gait velocity interpretation: Below normal speed for age/gender General Gait Details: Increased time with cues for posture, sequence and position from RW.     Stairs Stairs: Yes   Stair Management: One rail Left;Step to pattern;With cane;Forwards Number of Stairs: 2 General stair comments: cues for sequence and foot/QC placement  Wheelchair Mobility    Modified Rankin (Stroke Patients Only)       Balance Overall balance assessment: Needs assistance Sitting-balance support: Feet supported;No upper extremity supported Sitting balance-Leahy Scale: Good     Standing  balance support: No upper extremity supported Standing balance-Leahy Scale: Fair                              Cognition Arousal/Alertness: Awake/alert Behavior During Therapy: WFL for tasks assessed/performed Overall Cognitive Status: Within Functional Limits for tasks assessed                                        Exercises      General Comments        Pertinent Vitals/Pain Pain Assessment: 0-10 Pain Score: 4  Pain Location: R LE Pain Descriptors / Indicators: Aching Pain Intervention(s): Limited activity within patient's tolerance;Monitored during session;Premedicated before session;Ice applied    Home Living                      Prior Function            PT Goals (current goals can now be found in the care plan section) Acute Rehab PT Goals Patient Stated Goal: return home  PT Goal Formulation: With patient Time For Goal Achievement: 06/09/17 Potential to Achieve Goals: Good Progress towards PT goals: Progressing toward goals    Frequency    7X/week      PT Plan Current plan remains appropriate    Co-evaluation              AM-PAC PT "6 Clicks" Daily Activity  Outcome Measure  Difficulty turning over in bed (including adjusting bedclothes, sheets and blankets)?: Unable Difficulty moving from lying  on back to sitting on the side of the bed? : Unable Difficulty sitting down on and standing up from a chair with arms (e.g., wheelchair, bedside commode, etc,.)?: Unable Help needed moving to and from a bed to chair (including a wheelchair)?: A Little Help needed walking in hospital room?: A Little Help needed climbing 3-5 steps with a railing? : A Little 6 Click Score: 12    End of Session Equipment Utilized During Treatment: Gait belt Activity Tolerance: Patient tolerated treatment well;Patient limited by fatigue Patient left: with call bell/phone within reach;in chair Nurse Communication: Mobility status PT  Visit Diagnosis: Unsteadiness on feet (R26.81);History of falling (Z91.81)     Time: 3716-9678 PT Time Calculation (min) (ACUTE ONLY): 27 min  Charges:  $Gait Training: 23-37 mins                    G Codes:       Pg [phone]    Ronesha Heenan 06/03/2017, 1:17 PM

## 2017-06-03 NOTE — Progress Notes (Signed)
Occupational Therapy Treatment Patient Details Name: [name] MRN: [id number] DOB: Sep 01, 1950 Today's Date: 06/03/2017    History of present illness 66 y/o female s/p R THA anterior approach    OT comments  Pt doing well. Practiced 3in1 transfer this visit and education provided on safety with walker. Discussed AE options but pt states her mom will assist with LB dressing. Educated on sequence for LB dressing. Will continue to follow.   Follow Up Recommendations  Supervision/Assistance - 24 hour    Equipment Recommendations  None recommended by OT    Recommendations for Other Services      Precautions / Restrictions Precautions Precautions: Fall Restrictions Weight Bearing Restrictions: No Other Position/Activity Restrictions: WBAT        Mobility Bed Mobility               General bed mobility comments: pt on BSC when OT arrived.  Transfers Overall transfer level: Needs assistance Equipment used: Rolling walker (2 wheeled) Transfers: Sit to/from Stand Sit to Stand: Min guard         General transfer comment: cues for hand placement and LE management.    Balance                                           ADL either performed or assessed with clinical judgement   ADL       Grooming: Min guard;Standing                   Toilet Transfer: Min guard;Ambulation;BSC;RW   Toileting- Water quality scientist and Hygiene: Min guard;Sit to/from stand         General ADL Comments: Educated pt on AE options for LB self care but pt states her mother will assist with LB dressing so she declines need for AE at this time. Educated on sequence with RW use and pt transferred from [facility] on far side of bed, around the EOB and over to the sink. Educated on LB dressing sequence even if family is assisting and having walker in front of her when she stands. Pt has a shower chair for her shower stall but is currently having difficulty picking up R LE  off the floor so did not practice shower transfer this visit.      Vision Patient Visual Report: No change from baseline     Perception     Praxis      Cognition Arousal/Alertness: Awake/alert Behavior During Therapy: WFL for tasks assessed/performed Overall Cognitive Status: Within Functional Limits for tasks assessed                                          Exercises     Shoulder Instructions       General Comments      Pertinent Vitals/ Pain       Pain Assessment: 0-10 Pain Score: 6  Pain Location: R LE Pain Descriptors / Indicators: Aching Pain Intervention(s): Monitored during session;Ice applied  Home Living                                          Prior Functioning/Environment  Frequency  Min 2X/week        Progress Toward Goals  OT Goals(current goals can now be found in the care plan section)  Progress towards OT goals: Progressing toward goals     Plan Discharge plan remains appropriate    Co-evaluation                 AM-PAC PT "6 Clicks" Daily Activity     Outcome Measure   Help from another person eating meals?: None Help from another person taking care of personal grooming?: A Little Help from another person toileting, which includes using toliet, bedpan, or urinal?: A Little Help from another person bathing (including washing, rinsing, drying)?: A Little Help from another person to put on and taking off regular upper body clothing?: None Help from another person to put on and taking off regular lower body clothing?: A Lot 6 Click Score: 19    End of Session Equipment Utilized During Treatment: Rolling walker  OT Visit Diagnosis: Other abnormalities of gait and mobility (R26.89);Pain Pain - Right/Left: Right Pain - part of body: Hip   Activity Tolerance Patient tolerated treatment well   Patient Left in chair;with call bell/phone within reach   Nurse Communication           Time: 3329-5188 OT Time Calculation (min): 35 min  Charges: OT General Charges $OT Visit: 1 Visit OT Treatments $Self Care/Home Management : 8-22 mins $Therapeutic Activity: 8-22 mins     Philippa Chester 06/03/2017, 12:38 PM

## 2017-06-03 NOTE — Progress Notes (Signed)
Physical Therapy Treatment Patient Details Name: [name] MRN: [id number] DOB: 1951-03-25 Today's Date: 06/03/2017    History of Present Illness 66 y/o female s/p R THA anterior approach     PT Comments    Pt cooperative this am but ltd by increased discomfort.  Pt assisted with therex program to to [facility].   Follow Up Recommendations  Home health PT     Equipment Recommendations  None recommended by PT    Recommendations for Other Services OT consult     Precautions / Restrictions Precautions Precautions: Fall Restrictions Weight Bearing Restrictions: No Other Position/Activity Restrictions: WBAT     Mobility  Bed Mobility Overal bed mobility: Needs Assistance Bed Mobility: Supine to Sit     Supine to sit: Min assist;Mod assist     General bed mobility comments: Log roll for protection of recent back surgery.  Assist to complete roll and to manage R LE  Transfers Overall transfer level: Needs assistance Equipment used: Rolling walker (2 wheeled) Transfers: Sit to/from Stand Sit to Stand: Min assist         General transfer comment: assist to rise and steady; verbal cues for hand/LE placment  Ambulation/Gait Ambulation/Gait assistance: Min assist Ambulation Distance (Feet): 4 Feet Assistive device: Rolling walker (2 wheeled) Gait Pattern/deviations: Step-to pattern;Decreased step length - right;Decreased step length - left;Shuffle;Trunk flexed Gait velocity: decr Gait velocity interpretation: Below normal speed for age/gender General Gait Details: cues for posture, sequence and position from [employer]            Wheelchair Mobility    Modified Rankin (Stroke Patients Only)       Balance Overall balance assessment: Needs assistance Sitting-balance support: Feet supported;No upper extremity supported Sitting balance-Leahy Scale: Good     Standing balance support: No upper extremity supported Standing balance-Leahy Scale:  Fair Standing balance comment: reliant on UE support during mobility                             Cognition Arousal/Alertness: Awake/alert Behavior During Therapy: WFL for tasks assessed/performed Overall Cognitive Status: Within Functional Limits for tasks assessed                                        Exercises Total Joint Exercises Ankle Circles/Pumps: AROM;Both;20 reps;Supine Quad Sets: AROM;Both;10 reps;Supine Heel Slides: AAROM;Right;15 reps;Supine Hip ABduction/ADduction: AAROM;Right;Supine;10 reps    General Comments        Pertinent Vitals/Pain Pain Assessment: Faces Faces Pain Scale: Hurts even more Pain Location: R LE Pain Descriptors / Indicators: Grimacing;Operative site guarding Pain Intervention(s): Limited activity within patient's tolerance;Monitored during session;Premedicated before session    Home Living                      Prior Function            PT Goals (current goals can now be found in the care plan section) Acute Rehab PT Goals Patient Stated Goal: return home  PT Goal Formulation: With patient Time For Goal Achievement: 06/09/17 Potential to Achieve Goals: Good Progress towards PT goals: Progressing toward goals    Frequency    7X/week      PT Plan Current plan remains appropriate    Co-evaluation              AM-PAC PT "  6 Clicks" Daily Activity  Outcome Measure  Difficulty turning over in bed (including adjusting bedclothes, sheets and blankets)?: Unable Difficulty moving from lying on back to sitting on the side of the bed? : Unable Difficulty sitting down on and standing up from a chair with arms (e.g., wheelchair, bedside commode, etc,.)?: Unable Help needed moving to and from a bed to chair (including a wheelchair)?: A Lot Help needed walking in hospital room?: A Little Help needed climbing 3-5 steps with a railing? : A Lot 6 Click Score: 10    End of Session Equipment  Utilized During Treatment: Gait belt Activity Tolerance: Patient tolerated treatment well;Patient limited by pain Patient left: with call bell/phone within reach;Other (comment)(BSC) Nurse Communication: Mobility status PT Visit Diagnosis: Unsteadiness on feet (R26.81);History of falling (Z91.81)     Time: 3295-1884 PT Time Calculation (min) (ACUTE ONLY): 27 min  Charges:  $Therapeutic Exercise: 8-22 mins $Therapeutic Activity: 8-22 mins                    G Codes:       Pg [phone]    Glennis Montenegro 06/03/2017, 8:53 AM

## 2017-06-03 NOTE — Discharge Summary (Signed)
Physician Discharge Summary      Patient ID: [name] MRN: [id number] DOB/AGE: May 06, 1951 66 y.o.  Admit date: 06/01/2017 Discharge date: 06/03/2017  Admission Diagnoses:  Unilateral primary osteoarthritis, right hip  Discharge Diagnoses:  Principal Problem:   Unilateral primary osteoarthritis, right hip Active Problems:   Status post total replacement of right hip   Past Medical History:  Diagnosis Date  . Anxiety   . Arthritis   . Complication of anesthesia    "don't wake up easily"  . Coronary artery disease    LAD Cypher stent with PCI of the diagonal in 2004.    . Depression   . Dyslipidemia   . Fibromyalgia   . Headache    history of migraines  . Hypertension   . Myocardial infarction [facility])    see stress stest result; patient denies   . Pre-diabetes    "ive been told i am  borderline"     Surgeries: Procedure(s): RIGHT TOTAL HIP ARTHROPLASTY ANTERIOR APPROACH on 06/01/2017   Consultants (if any):   Discharged Condition: Improved  Hospital Course: [name] is an 66 y.o. female who was admitted 06/01/2017 with a diagnosis of Unilateral primary osteoarthritis, right hip and went to the operating room on 06/01/2017 and underwent the above named procedures.    She was given perioperative antibiotics:  Anti-infectives (From admission, onward)   Start     Dose/Rate Route Frequency Ordered Stop   06/01/17 1900  clindamycin (CLEOCIN) IVPB 600 mg     600 mg 100 mL/hr over 30 Minutes Intravenous Every 6 hours 06/01/17 1619 06/02/17 0211   06/01/17 1315  clindamycin (CLEOCIN) 900 MG/50ML IVPB    Comments:  [place] Session   : cabinet override      06/01/17 1315 06/01/17 1304   06/01/17 0954  ceFAZolin (ANCEF) 2-4 GM/100ML-% IVPB  Status:  Discontinued    Comments:  [place] Session   : cabinet override      06/01/17 0954 06/01/17 1320   06/01/17 0948  clindamycin (CLEOCIN) IVPB 900 mg     900 mg 100 mL/hr over 30 Minutes Intravenous On call to O.R. 06/01/17  9323 06/01/17 1315    .  She was given sequential compression devices, early ambulation, and aspirin for DVT prophylaxis.  She benefited maximally from the hospital stay and there were no complications.    Recent vital signs:  Vitals:   06/02/17 2021 06/03/17 0621  BP: (!) 141/54 (!) 145/55  Pulse: 71 68  Resp: 16 18  Temp: 97.6 F (36.4 C) 98 F (36.7 C)  SpO2: 100% 100%    Recent laboratory studies:  Lab Results  Component Value Date   HGB 10.7 (L) 06/02/2017   HGB 12.8 05/25/2017   HGB 9.8 (L) 01/31/2017   Lab Results  Component Value Date   WBC 13.2 (H) 06/02/2017   PLT 274 06/02/2017   Lab Results  Component Value Date   INR 0.94 12/26/2010   Lab Results  Component Value Date   NA 138 06/02/2017   K 3.9 06/02/2017   CL 104 06/02/2017   CO2 28 06/02/2017   BUN 11 06/02/2017   CREATININE 1.05 (H) 06/02/2017   GLUCOSE 164 (H) 06/02/2017    Discharge Medications:   Allergies as of 06/03/2017      Reactions   Iodine Swelling   Red spots on skin after cath, swollen lips and eyes   Adhesive [tape] Dermatitis   Plastic tape causes skin irritation  Niacin Rash   MAY BE PREDICTABLE RESPONSE TO NIACIN   Penicillins Rash      Medication List    STOP taking these medications   traMADol 50 MG tablet Commonly known as:  ULTRAM     TAKE these medications   acetaminophen 500 MG tablet Commonly known as:  TYLENOL Take 1,000 mg by mouth every 6 (six) hours as needed for mild pain or moderate pain.   ALPRAZolam 0.5 MG tablet Commonly known as:  XANAX Take 0.5 mg by mouth 3 (three) times daily as needed for anxiety.   amLODipine 10 MG tablet Commonly known as:  NORVASC Take 10 mg by mouth daily.   aspirin EC 81 MG tablet Take 1 tablet (81 mg total) 2 (two) times daily after a meal by mouth. What changed:  when to take this   atorvastatin 10 MG tablet Commonly known as:  LIPITOR Take 10 mg by mouth at bedtime.   cholecalciferol 1000 units  tablet Commonly known as:  VITAMIN D Take 1,000 Units by mouth daily.   furosemide 40 MG tablet Commonly known as:  LASIX Take 40 mg by mouth daily.   gabapentin 400 MG capsule Commonly known as:  NEURONTIN Take 800 mg by mouth 2 (two) times daily.   lisinopril-hydrochlorothiazide 10-12.5 MG tablet Commonly known as:  PRINZIDE,ZESTORETIC Take 1 tablet by mouth daily.   methocarbamol 500 MG tablet Commonly known as:  ROBAXIN Take 1 tablet (500 mg total) every 6 (six) hours as needed by mouth for muscle spasms.   metoprolol tartrate 100 MG tablet Commonly known as:  LOPRESSOR Take 100 mg by mouth 2 (two) times daily.   multivitamin with minerals tablet Take 1 tablet by mouth daily. Women's One-A-Day   oxyCODONE-acetaminophen 5-325 MG tablet Commonly known as:  ROXICET Take 1-2 tablets every 4 (four) hours as needed by mouth.   triamcinolone 0.1 % paste Commonly known as:  KENALOG Use as directed 1 application in the mouth or throat 2 (two) times daily as needed (mouth ulcers).            Durable Medical Equipment  (From admission, onward)        Start     Ordered   06/01/17 1620  DME 3 n 1  Once     06/01/17 1619   06/01/17 1620  DME Walker rolling  Once    Question:  Patient needs a walker to treat with the following condition  Answer:  Status post total replacement of right hip   06/01/17 1619      Diagnostic Studies: Dg Pelvis Portable  Result Date: 06/01/2017 CLINICAL DATA:  Post right hip arthroplasty. EXAM: PORTABLE PELVIS 1-2 VIEWS COMPARISON:  06/01/2017, intraoperative images. FINDINGS: Post total right hip arthroplasty with normal alignment of the 3 component orthopedic hardware. No evidence of fracture. Postoperative findings of soft tissue edema and emphysema. Prior left hip arthroplasty. IMPRESSION: Status post total right hip arthroplasty with normal alignment of the orthopedic hardware. These results will be called to the ordering clinician or  representative by the Radiologist Assistant, and communication documented in the PACS or zVision Dashboard. Electronically Signed   By: Fidela Salisbury M.D.   On: 06/01/2017 15:29   Dg C-arm 1-60 Min-no Report  Result Date: 06/01/2017 Fluoroscopy was utilized by the requesting physician.  No radiographic interpretation.   Dg Hip Operative Unilat With Pelvis Right  Result Date: 06/01/2017 CLINICAL DATA:  Osteoarthritis of the right hip. EXAM: OPERATIVE RIGHT HIP (WITH PELVIS  IF PERFORMED) MULTIPLE AP VIEWS; C-ARM IMAGING TECHNIQUE: Fluoroscopic spot image(s) were submitted for interpretation post-operatively. COMPARISON:  RADIOGRAPHS DATED 09/05/2016 FINDINGS: MULTIPLE AP C-ARM IMAGES DEMONSTRATE THE PATIENT UNDERGOING RIGHT TOTAL HIP PROSTHESIS INSERTION. THE COMPONENTS APPEAR IN EXCELLENT POSITION IN THE AP PROJECTION. IMPRESSION: Right total hip prosthesis inserted. Electronically Signed   By: Lorriane Shire M.D.   On: 06/01/2017 14:40   Xr Knee 1-2 Views Right  Result Date: 05/07/2017 An AP and lateral of her right knee shows a stable total knee arthroplasty with no complicating features or acute findings.   Disposition: 01-Home or Self Care    Follow-up Information    [facility] Follow up.   Specialty:  [place] Why:  [place] will call to arrange initial visit Contact information: [place] [address] [phone]        Mcarthur Rossetti, MD Follow up in 2 week(s).   Specialty:  Orthopedic Surgery Contact information: [place] [address] [phone]            Signed: Eduard Roux 06/03/2017, 10:13 AM

## 2017-06-03 NOTE — Progress Notes (Signed)
Physical Therapy Treatment Patient Details Name: [name] MRN: [id number] DOB: Dec 14, 1950 Today's Date: 06/03/2017    History of Present Illness 66 y/o female s/p R THA anterior approach     PT Comments    PT attempted at request of pt but ltd by increased pain.  Session shortened and will re-attempt following additional pain meds.   Follow Up Recommendations  Home health PT     Equipment Recommendations  None recommended by PT    Recommendations for Other Services OT consult     Precautions / Restrictions Precautions Precautions: Fall Restrictions Weight Bearing Restrictions: No Other Position/Activity Restrictions: WBAT     Mobility  Bed Mobility               General bed mobility comments: pt on BSC when OT arrived.  Transfers Overall transfer level: Needs assistance Equipment used: Rolling walker (2 wheeled) Transfers: Sit to/from Stand Sit to Stand: Min guard         General transfer comment: cues for hand placement and LE management.  Ambulation/Gait Ambulation/Gait assistance: Min assist Ambulation Distance (Feet): 12 Feet Assistive device: Rolling walker (2 wheeled) Gait Pattern/deviations: Step-to pattern;Decreased step length - right;Decreased step length - left;Shuffle;Trunk flexed Gait velocity: decr   General Gait Details: Increased time with cues for posture, sequence and position from RW.  Distance ltd by increased pain.  Pt returned to sitting and RN advised of need for pain meds.   Stairs            Wheelchair Mobility    Modified Rankin (Stroke Patients Only)       Balance                                            Cognition Arousal/Alertness: Awake/alert Behavior During Therapy: WFL for tasks assessed/performed Overall Cognitive Status: Within Functional Limits for tasks assessed                                        Exercises      General Comments        Pertinent  Vitals/Pain Pain Assessment: 0-10 Pain Score: 7  Pain Location: R LE Pain Descriptors / Indicators: Aching Pain Intervention(s): Limited activity within patient's tolerance;Monitored during session;Premedicated before session;Patient requesting pain meds-RN notified;Ice applied    Home Living                      Prior Function            PT Goals (current goals can now be found in the care plan section) Acute Rehab PT Goals Patient Stated Goal: return home  PT Goal Formulation: With patient Time For Goal Achievement: 06/09/17 Potential to Achieve Goals: Good Progress towards PT goals: Progressing toward goals    Frequency    7X/week      PT Plan Current plan remains appropriate    Co-evaluation              AM-PAC PT "6 Clicks" Daily Activity  Outcome Measure  Difficulty turning over in bed (including adjusting bedclothes, sheets and blankets)?: Unable Difficulty moving from lying on back to sitting on the side of the bed? : Unable Difficulty sitting down on and standing up from a chair with  arms (e.g., wheelchair, bedside commode, etc,.)?: Unable Help needed moving to and from a bed to chair (including a wheelchair)?: A Little Help needed walking in hospital room?: A Little Help needed climbing 3-5 steps with a railing? : A Lot 6 Click Score: 11    End of Session Equipment Utilized During Treatment: Gait belt Activity Tolerance: Patient limited by pain Patient left: with call bell/phone within reach;in chair Nurse Communication: Mobility status PT Visit Diagnosis: Unsteadiness on feet (R26.81);History of falling (Z91.81)     Time: 0623-7628 PT Time Calculation (min) (ACUTE ONLY): 18 min  Charges:  $Gait Training: 8-22 mins                    G Codes:       Pg [phone]    Ryann Pauli 06/03/2017, 1:11 PM

## 2017-06-03 NOTE — Progress Notes (Signed)
Discharged from floor via w/c for transport home by car. Belongings & spouse with pt. No changes in assessment. [name]  

## 2017-06-03 NOTE — Progress Notes (Signed)
Patient is progressing well with physical therapy and is ready for discharge home today.  Dressing is clean dry and intact.  Follow-up with Dr. Ninfa Linden as scheduled.

## 2017-06-04 ENCOUNTER — Telehealth (INDEPENDENT_AMBULATORY_CARE_PROVIDER_SITE_OTHER): Payer: Self-pay | Admitting: Orthopaedic Surgery

## 2017-06-04 DIAGNOSIS — I10 Essential (primary) hypertension: Secondary | ICD-10-CM | POA: Diagnosis not present

## 2017-06-04 DIAGNOSIS — F419 Anxiety disorder, unspecified: Secondary | ICD-10-CM | POA: Diagnosis not present

## 2017-06-04 DIAGNOSIS — M797 Fibromyalgia: Secondary | ICD-10-CM | POA: Diagnosis not present

## 2017-06-04 DIAGNOSIS — I25119 Atherosclerotic heart disease of native coronary artery with unspecified angina pectoris: Secondary | ICD-10-CM | POA: Diagnosis not present

## 2017-06-04 DIAGNOSIS — Z471 Aftercare following joint replacement surgery: Secondary | ICD-10-CM | POA: Diagnosis not present

## 2017-06-04 DIAGNOSIS — M4316 Spondylolisthesis, lumbar region: Secondary | ICD-10-CM | POA: Diagnosis not present

## 2017-06-04 NOTE — Telephone Encounter (Signed)
Just FYI HHPT called stating patient takes 2 pain pills every 4 hours and still said she was in 8/10 pain. Just FYI

## 2017-06-04 NOTE — Telephone Encounter (Signed)
Sharyn Lull from [place] called for verbal PT orders.  3 times a week for 2 weeks.  Please call to confirm

## 2017-06-04 NOTE — Telephone Encounter (Signed)
Sharyn Lull called from [place] called in regard to PT and the orders are as follows: 3 times a week for 2 weeks  Please call Sharyn Lull # [phone] to confirm orders

## 2017-06-04 NOTE — Op Note (Signed)
NAME:  [name], [name]                       ACCOUNT NO.:  MEDICAL RECORD NO.:  [id number]  LOCATION:                                 FACILITY:  PHYSICIAN:  Lind Guest. Ninfa Linden, M.D.DATE OF BIRTH:  02/20/1951  DATE OF PROCEDURE:  06/01/2017 DATE OF DISCHARGE:                              OPERATIVE REPORT   PREOPERATIVE DIAGNOSIS:  Severe primary osteoarthritis and degenerative joint disease of right hip.  POSTOPERATIVE DIAGNOSIS:  Severe primary osteoarthritis and degenerative joint disease of right hip.  PROCEDURE:  Right total hip arthroplasty through direct anterior approach.  IMPLANTS:  DePuy Sector Gription acetabular component size 52 with single screw, size 36+ 0 polyethylene liner, size 12 Corail femoral component with varus offset, size 36- 2 metal hip ball.  SURGEON:  Lind Guest. Ninfa Linden, M.D.  ASSISTANT:  RNFA.  ANTIBIOTICS:  2 g of IV Ancef.  ANESTHESIA:  Spinal.  BLOOD LOSS:  200 mL.  COMPLICATIONS:  None.  INDICATIONS:  [name] is a very pleasant 66 year old female, well known to me.  I actually performed a total hip arthroplasty on her left hip in 2012.  Her right hip has become worse over time.  She has debilitating arthritis at this point of that right hip.  It has been really getting severely painful for the last 2 years and she has tried and failed all forms of conservative treatment.  At this point, with her joint space narrowing, her periarticular osteophytes and sclerotic changes within the right hip, she does wish to proceed with a total hip arthroplasty.  Her pain is 10/10 and it is daily.  It is detrimentally affected her activities of daily living, her quality of life, and her mobility.  PROCEDURE DESCRIPTION:  After informed consent was obtained, appropriate right hip was marked.  She was brought to the operating room and spinal anesthesia was obtained while she was on her stretcher.  A Foley catheter was placed and the both feet  had traction boots applied to them.  Next, she was placed supine on the Hana fracture table with the perineal post in place and both legs in inline skeletal traction devices, but no traction applied.  Her right operative hip was prepped and draped with DuraPrep and sterile drapes.  A time-out was called and she was identified as correct patient and correct right hip.  We then made an incision just inferior and posterior to the anterior superior iliac spine and carried this obliquely down the leg.  We dissected down the tensor fascia lata muscle.  The tensor fascia was then divided longitudinally to proceed with a direct anterior approach to the hip. We identified and cauterized circumflex vessels and then identified the hip capsule.  I opened up the hip capsule in an L-type format, finding a very large joint effusion and significant arthritis throughout her right hip.  I placed a Cobra retractor around the medial acetabular rim and removed remnants of the acetabular labrum.  I then began reaming under direct visualization from a size 42 reamer going in stepwise increments up to a size 50, with last reamer under direct fluoroscopy, so I could obtain my  depth of reaming, our inclination and anteversion.  With that, I then placed the real DePuy Sector Gription acetabular component size 50, but I could not get a good fit to this.  I went up to a size 52 and placed a 52 without difficulty and had a nice strong fit to it and I still placed a secondary screw in this.  I then placed the real 36+ 0 polyethylene liner for that size acetabular component.  Attention was then turned to the femur.  With the leg externally rotated to 120 degrees extended and adducted, I was able to place a Mueller retractor medially and a Hohmann retractor around the greater trochanter.  I released the joint capsule and used a box cutting osteotome to the inner femoral canal and a rongeur to lateralize.  I then began  broaching from a size 8 broach using the Corail broaching system going up to size 12. With the size 12 in place, we trialed a varus offset femoral neck and we had to go down to 32- 2 hip ball due to her tightness.  We reduced this in the acetabulum and we were pleased with leg length, offset, range of motion and stability.  We then dislocated the hip and removed the trial components.  We were able to place the real Corail femoral component with varus offset size 12 and the real 36- 2 metal hip ball.  We reduced this in the acetabulum and again, we were pleased with stability.  We then irrigated the soft tissue with normal saline solution using pulsatile lavage.  We were able to close the joint capsule with interrupted #1 Ethibond suture followed by running #1 Vicryl in the tensor fascia, 0 Vicryl in the deep tissue, 2-0 Vicryl in the subcutaneous tissue, interrupted staples on the skin.  Xeroform and Aquacel dressing were applied.  She was then taken off the Hana table and taken to the recovery room in stable condition.  All final counts were correct.  There were no complications noted.     Lind Guest. Ninfa Linden, M.D.     CYB/MEDQ  D:  06/01/2017  T:  06/01/2017  Job:  [id number]

## 2017-06-06 DIAGNOSIS — Z471 Aftercare following joint replacement surgery: Secondary | ICD-10-CM | POA: Diagnosis not present

## 2017-06-06 DIAGNOSIS — F419 Anxiety disorder, unspecified: Secondary | ICD-10-CM | POA: Diagnosis not present

## 2017-06-06 DIAGNOSIS — M4316 Spondylolisthesis, lumbar region: Secondary | ICD-10-CM | POA: Diagnosis not present

## 2017-06-06 DIAGNOSIS — I25119 Atherosclerotic heart disease of native coronary artery with unspecified angina pectoris: Secondary | ICD-10-CM | POA: Diagnosis not present

## 2017-06-06 DIAGNOSIS — I10 Essential (primary) hypertension: Secondary | ICD-10-CM | POA: Diagnosis not present

## 2017-06-06 DIAGNOSIS — M797 Fibromyalgia: Secondary | ICD-10-CM | POA: Diagnosis not present

## 2017-06-08 DIAGNOSIS — Z471 Aftercare following joint replacement surgery: Secondary | ICD-10-CM | POA: Diagnosis not present

## 2017-06-08 DIAGNOSIS — I10 Essential (primary) hypertension: Secondary | ICD-10-CM | POA: Diagnosis not present

## 2017-06-08 DIAGNOSIS — F419 Anxiety disorder, unspecified: Secondary | ICD-10-CM | POA: Diagnosis not present

## 2017-06-08 DIAGNOSIS — M797 Fibromyalgia: Secondary | ICD-10-CM | POA: Diagnosis not present

## 2017-06-08 DIAGNOSIS — I25119 Atherosclerotic heart disease of native coronary artery with unspecified angina pectoris: Secondary | ICD-10-CM | POA: Diagnosis not present

## 2017-06-08 DIAGNOSIS — M4316 Spondylolisthesis, lumbar region: Secondary | ICD-10-CM | POA: Diagnosis not present

## 2017-06-11 ENCOUNTER — Telehealth (INDEPENDENT_AMBULATORY_CARE_PROVIDER_SITE_OTHER): Payer: Self-pay | Admitting: Orthopaedic Surgery

## 2017-06-11 DIAGNOSIS — F419 Anxiety disorder, unspecified: Secondary | ICD-10-CM | POA: Diagnosis not present

## 2017-06-11 DIAGNOSIS — Z471 Aftercare following joint replacement surgery: Secondary | ICD-10-CM | POA: Diagnosis not present

## 2017-06-11 DIAGNOSIS — I25119 Atherosclerotic heart disease of native coronary artery with unspecified angina pectoris: Secondary | ICD-10-CM | POA: Diagnosis not present

## 2017-06-11 DIAGNOSIS — M4316 Spondylolisthesis, lumbar region: Secondary | ICD-10-CM | POA: Diagnosis not present

## 2017-06-11 DIAGNOSIS — M797 Fibromyalgia: Secondary | ICD-10-CM | POA: Diagnosis not present

## 2017-06-11 DIAGNOSIS — I10 Essential (primary) hypertension: Secondary | ICD-10-CM | POA: Diagnosis not present

## 2017-06-11 MED ORDER — OXYCODONE-ACETAMINOPHEN 5-325 MG PO TABS: 1.0000 | ORAL_TABLET | Freq: Four times a day (QID) | ORAL | 0 refills | Status: DC | PRN

## 2017-06-11 NOTE — Telephone Encounter (Signed)
Please advise 

## 2017-06-11 NOTE — Telephone Encounter (Signed)
[name],[name] [birth year]    Please call pt to discuss her meds,Physical therapist also has suggestions for pt. Pt stated she has one pill and needs her refill today.  [facility]/pt mentioned several different meds please call pt to verify med.

## 2017-06-11 NOTE — Telephone Encounter (Signed)
Can come and pick up script 

## 2017-06-13 DIAGNOSIS — Z471 Aftercare following joint replacement surgery: Secondary | ICD-10-CM | POA: Diagnosis not present

## 2017-06-13 DIAGNOSIS — I10 Essential (primary) hypertension: Secondary | ICD-10-CM | POA: Diagnosis not present

## 2017-06-13 DIAGNOSIS — M4316 Spondylolisthesis, lumbar region: Secondary | ICD-10-CM | POA: Diagnosis not present

## 2017-06-13 DIAGNOSIS — I25119 Atherosclerotic heart disease of native coronary artery with unspecified angina pectoris: Secondary | ICD-10-CM | POA: Diagnosis not present

## 2017-06-13 DIAGNOSIS — F419 Anxiety disorder, unspecified: Secondary | ICD-10-CM | POA: Diagnosis not present

## 2017-06-13 DIAGNOSIS — M797 Fibromyalgia: Secondary | ICD-10-CM | POA: Diagnosis not present

## 2017-06-15 DIAGNOSIS — M4316 Spondylolisthesis, lumbar region: Secondary | ICD-10-CM | POA: Diagnosis not present

## 2017-06-15 DIAGNOSIS — I25119 Atherosclerotic heart disease of native coronary artery with unspecified angina pectoris: Secondary | ICD-10-CM | POA: Diagnosis not present

## 2017-06-15 DIAGNOSIS — F419 Anxiety disorder, unspecified: Secondary | ICD-10-CM | POA: Diagnosis not present

## 2017-06-15 DIAGNOSIS — I10 Essential (primary) hypertension: Secondary | ICD-10-CM | POA: Diagnosis not present

## 2017-06-15 DIAGNOSIS — Z471 Aftercare following joint replacement surgery: Secondary | ICD-10-CM | POA: Diagnosis not present

## 2017-06-15 DIAGNOSIS — M797 Fibromyalgia: Secondary | ICD-10-CM | POA: Diagnosis not present

## 2017-06-18 ENCOUNTER — Encounter (INDEPENDENT_AMBULATORY_CARE_PROVIDER_SITE_OTHER): Payer: Self-pay | Admitting: Orthopaedic Surgery

## 2017-06-18 ENCOUNTER — Ambulatory Visit (INDEPENDENT_AMBULATORY_CARE_PROVIDER_SITE_OTHER): Payer: Medicare Other | Admitting: Orthopaedic Surgery

## 2017-06-18 DIAGNOSIS — Z96641 Presence of right artificial hip joint: Secondary | ICD-10-CM

## 2017-06-18 MED ORDER — OXYCODONE HCL 5 MG PO CAPS: 5.0000 mg | ORAL_CAPSULE | ORAL | 0 refills | Status: DC | PRN

## 2017-06-18 MED ORDER — METHOCARBAMOL 500 MG PO TABS: 500.0000 mg | ORAL_TABLET | Freq: Four times a day (QID) | ORAL | 3 refills | Status: DC | PRN

## 2017-06-18 NOTE — Progress Notes (Signed)
Patient is now 2 weeks status post a right total hip arthroplasty through direct anterior approach.  She is doing well.  Notes from home therapy to extend her therapy for at least 2-3 more weeks she has no good assistance at home.  She feels like she is doing well overall.  She is using a rolling walker.  She does need a refill of her Robaxin and oxycodone.  On exam her ligaments are equal.  There is no significant seroma around the right hip incision.  The staples are intact and remove staples apply Steri-Strips without any difficulty.  I told her about when she can get this wet in shower and how to increase her activities as well.  I did refill her pain medication and muscle relaxant and counseled her about these as well.  We will see her back in 4 weeks to see how she is doing overall but no x-rays are needed.  I did give her a prescription to extend her home physical therapy for 2-3 more weeks to work on her balance, coordination, mobility and activities of daily living.

## 2017-06-19 ENCOUNTER — Telehealth (INDEPENDENT_AMBULATORY_CARE_PROVIDER_SITE_OTHER): Payer: Self-pay | Admitting: Orthopaedic Surgery

## 2017-06-19 NOTE — Telephone Encounter (Signed)
Verbal order left on VM  

## 2017-06-19 NOTE — Telephone Encounter (Signed)
Melissa Careers information officer with [facility] called left voicemail message stating patient called yesterday advised Dr Ninfa Linden wanted her to continue (PT) for 2 to 3 more weeks. Melissa advised patient was discharged 06/15/17 and she will need new orders to continue patient's care. Melissa advised she can take a verbal order. The phone # is [phone]  The fax number is [phone]

## 2017-06-21 DIAGNOSIS — M4316 Spondylolisthesis, lumbar region: Secondary | ICD-10-CM | POA: Diagnosis not present

## 2017-06-21 DIAGNOSIS — F419 Anxiety disorder, unspecified: Secondary | ICD-10-CM | POA: Diagnosis not present

## 2017-06-21 DIAGNOSIS — M797 Fibromyalgia: Secondary | ICD-10-CM | POA: Diagnosis not present

## 2017-06-21 DIAGNOSIS — Z471 Aftercare following joint replacement surgery: Secondary | ICD-10-CM | POA: Diagnosis not present

## 2017-06-21 DIAGNOSIS — I10 Essential (primary) hypertension: Secondary | ICD-10-CM | POA: Diagnosis not present

## 2017-06-21 DIAGNOSIS — I25119 Atherosclerotic heart disease of native coronary artery with unspecified angina pectoris: Secondary | ICD-10-CM | POA: Diagnosis not present

## 2017-06-29 DIAGNOSIS — Z6834 Body mass index (BMI) 34.0-34.9, adult: Secondary | ICD-10-CM | POA: Diagnosis not present

## 2017-06-29 DIAGNOSIS — M1611 Unilateral primary osteoarthritis, right hip: Secondary | ICD-10-CM | POA: Diagnosis not present

## 2017-06-29 DIAGNOSIS — R300 Dysuria: Secondary | ICD-10-CM | POA: Diagnosis not present

## 2017-06-29 DIAGNOSIS — M19041 Primary osteoarthritis, right hand: Secondary | ICD-10-CM | POA: Diagnosis not present

## 2017-06-29 DIAGNOSIS — M797 Fibromyalgia: Secondary | ICD-10-CM | POA: Diagnosis not present

## 2017-07-06 ENCOUNTER — Telehealth (INDEPENDENT_AMBULATORY_CARE_PROVIDER_SITE_OTHER): Payer: Self-pay | Admitting: Orthopaedic Surgery

## 2017-07-06 NOTE — Telephone Encounter (Signed)
MISSED TWO VISITS AND  ASKING FOR PERMISSION TO SEE HER NEXT WEEK FOR 2 VISITS

## 2017-07-06 NOTE — Telephone Encounter (Signed)
Verbal order given  

## 2017-07-09 DIAGNOSIS — I25119 Atherosclerotic heart disease of native coronary artery with unspecified angina pectoris: Secondary | ICD-10-CM | POA: Diagnosis not present

## 2017-07-09 DIAGNOSIS — M4316 Spondylolisthesis, lumbar region: Secondary | ICD-10-CM | POA: Diagnosis not present

## 2017-07-09 DIAGNOSIS — M797 Fibromyalgia: Secondary | ICD-10-CM | POA: Diagnosis not present

## 2017-07-09 DIAGNOSIS — I10 Essential (primary) hypertension: Secondary | ICD-10-CM | POA: Diagnosis not present

## 2017-07-09 DIAGNOSIS — Z471 Aftercare following joint replacement surgery: Secondary | ICD-10-CM | POA: Diagnosis not present

## 2017-07-09 DIAGNOSIS — F419 Anxiety disorder, unspecified: Secondary | ICD-10-CM | POA: Diagnosis not present

## 2017-07-12 ENCOUNTER — Ambulatory Visit (INDEPENDENT_AMBULATORY_CARE_PROVIDER_SITE_OTHER): Payer: Medicare Other | Admitting: Orthopaedic Surgery

## 2017-07-12 ENCOUNTER — Encounter (INDEPENDENT_AMBULATORY_CARE_PROVIDER_SITE_OTHER): Payer: Self-pay | Admitting: Orthopaedic Surgery

## 2017-07-12 DIAGNOSIS — M4316 Spondylolisthesis, lumbar region: Secondary | ICD-10-CM | POA: Diagnosis not present

## 2017-07-12 DIAGNOSIS — Z96641 Presence of right artificial hip joint: Secondary | ICD-10-CM

## 2017-07-12 DIAGNOSIS — M5416 Radiculopathy, lumbar region: Secondary | ICD-10-CM | POA: Diagnosis not present

## 2017-07-12 NOTE — Progress Notes (Signed)
Patient is now 6 weeks status post a right total hip arthroplasty through direct anterior approach.  She is about 6 years out from her left total hip arthroplasty.  She says both of them are doing well at this point.  On exam she lets me easily move both hips through full range of motion without difficulty at all.  Her leg lengths are equal.  At this point I will need to see her back for 6 months.  At that visit I would like just a low AP pelvis.  All questions concerns were answered and addressed.  She can continue to take occasional tramadol and occasional muscle relaxant.

## 2017-10-08 DIAGNOSIS — I10 Essential (primary) hypertension: Secondary | ICD-10-CM | POA: Diagnosis not present

## 2017-10-08 DIAGNOSIS — M797 Fibromyalgia: Secondary | ICD-10-CM | POA: Diagnosis not present

## 2017-10-08 DIAGNOSIS — R739 Hyperglycemia, unspecified: Secondary | ICD-10-CM | POA: Diagnosis not present

## 2017-10-08 DIAGNOSIS — Z6834 Body mass index (BMI) 34.0-34.9, adult: Secondary | ICD-10-CM | POA: Diagnosis not present

## 2017-10-08 DIAGNOSIS — L639 Alopecia areata, unspecified: Secondary | ICD-10-CM | POA: Diagnosis not present

## 2017-11-07 DIAGNOSIS — M778 Other enthesopathies, not elsewhere classified: Secondary | ICD-10-CM | POA: Diagnosis not present

## 2017-11-07 DIAGNOSIS — L639 Alopecia areata, unspecified: Secondary | ICD-10-CM | POA: Diagnosis not present

## 2017-11-07 DIAGNOSIS — Z6834 Body mass index (BMI) 34.0-34.9, adult: Secondary | ICD-10-CM | POA: Diagnosis not present

## 2017-11-07 DIAGNOSIS — M1712 Unilateral primary osteoarthritis, left knee: Secondary | ICD-10-CM | POA: Diagnosis not present

## 2017-11-28 DIAGNOSIS — L570 Actinic keratosis: Secondary | ICD-10-CM | POA: Diagnosis not present

## 2017-11-28 DIAGNOSIS — M25562 Pain in left knee: Secondary | ICD-10-CM | POA: Diagnosis not present

## 2017-11-28 DIAGNOSIS — M7122 Synovial cyst of popliteal space [Baker], left knee: Secondary | ICD-10-CM | POA: Diagnosis not present

## 2017-11-28 DIAGNOSIS — L659 Nonscarring hair loss, unspecified: Secondary | ICD-10-CM | POA: Diagnosis not present

## 2017-11-28 DIAGNOSIS — Z6834 Body mass index (BMI) 34.0-34.9, adult: Secondary | ICD-10-CM | POA: Diagnosis not present

## 2017-11-28 DIAGNOSIS — M1712 Unilateral primary osteoarthritis, left knee: Secondary | ICD-10-CM | POA: Diagnosis not present

## 2017-11-28 DIAGNOSIS — L280 Lichen simplex chronicus: Secondary | ICD-10-CM | POA: Diagnosis not present

## 2017-11-28 DIAGNOSIS — D485 Neoplasm of uncertain behavior of skin: Secondary | ICD-10-CM | POA: Diagnosis not present

## 2017-11-28 DIAGNOSIS — M25462 Effusion, left knee: Secondary | ICD-10-CM | POA: Diagnosis not present

## 2018-01-03 ENCOUNTER — Ambulatory Visit (INDEPENDENT_AMBULATORY_CARE_PROVIDER_SITE_OTHER): Payer: Medicare Other

## 2018-01-03 ENCOUNTER — Encounter (INDEPENDENT_AMBULATORY_CARE_PROVIDER_SITE_OTHER): Payer: Self-pay | Admitting: Physician Assistant

## 2018-01-03 ENCOUNTER — Ambulatory Visit (INDEPENDENT_AMBULATORY_CARE_PROVIDER_SITE_OTHER): Payer: Medicare Other | Admitting: Orthopaedic Surgery

## 2018-01-03 ENCOUNTER — Ambulatory Visit (INDEPENDENT_AMBULATORY_CARE_PROVIDER_SITE_OTHER): Payer: Medicare Other | Admitting: Physician Assistant

## 2018-01-03 DIAGNOSIS — Z6834 Body mass index (BMI) 34.0-34.9, adult: Secondary | ICD-10-CM | POA: Diagnosis not present

## 2018-01-03 DIAGNOSIS — M4316 Spondylolisthesis, lumbar region: Secondary | ICD-10-CM | POA: Diagnosis not present

## 2018-01-03 DIAGNOSIS — Z96641 Presence of right artificial hip joint: Secondary | ICD-10-CM

## 2018-01-03 DIAGNOSIS — I10 Essential (primary) hypertension: Secondary | ICD-10-CM | POA: Diagnosis not present

## 2018-01-03 NOTE — Progress Notes (Signed)
HPI: Mrs. Malstrom returns today 7 months status post right total hip arthroplasty.  She is overall doing well.  She does have some numbness about the thigh still.  She still has some low back pain which she sees Dr. Ellene Route for.  Overall feels she has good motion and strength of the right hip.  Physical exam: General well-developed well-nourished  female in no acute distress. Right hip good range of motion of the hip without pain.  Right calf supple nontender.  She is full dorsiflexion plantarflexion right ankle.  Ambulates with a nonantalgic gait.  No assistive device.  Radiographs:AP pelvis: Status post bilateral total hip arthroplasties components are well-seated.  No acute fractures no bony maladies.  Bilateral hips well located.  Impression: 7 months status post right total hip arthroplasty  Plan: We will have her follow-up with [place] in 5 months check her progress mostly in regards to numbness of the thigh.  Will obtain AP pelvis at that time.  Questions encouraged and answered at length.  She will follow-up sooner if there is any concerns.

## 2018-01-14 DIAGNOSIS — L280 Lichen simplex chronicus: Secondary | ICD-10-CM | POA: Diagnosis not present

## 2018-01-14 DIAGNOSIS — L570 Actinic keratosis: Secondary | ICD-10-CM | POA: Diagnosis not present

## 2018-01-14 DIAGNOSIS — L720 Epidermal cyst: Secondary | ICD-10-CM | POA: Diagnosis not present

## 2018-02-07 DIAGNOSIS — L720 Epidermal cyst: Secondary | ICD-10-CM | POA: Diagnosis not present

## 2018-02-07 DIAGNOSIS — D485 Neoplasm of uncertain behavior of skin: Secondary | ICD-10-CM | POA: Diagnosis not present

## 2018-02-21 ENCOUNTER — Other Ambulatory Visit: Payer: Self-pay

## 2018-02-21 DIAGNOSIS — L280 Lichen simplex chronicus: Secondary | ICD-10-CM | POA: Diagnosis not present

## 2018-04-16 DIAGNOSIS — L280 Lichen simplex chronicus: Secondary | ICD-10-CM | POA: Diagnosis not present

## 2018-04-17 DIAGNOSIS — Z23 Encounter for immunization: Secondary | ICD-10-CM | POA: Diagnosis not present

## 2018-04-17 DIAGNOSIS — I10 Essential (primary) hypertension: Secondary | ICD-10-CM | POA: Diagnosis not present

## 2018-04-17 DIAGNOSIS — Z0001 Encounter for general adult medical examination with abnormal findings: Secondary | ICD-10-CM | POA: Diagnosis not present

## 2018-04-17 DIAGNOSIS — Z6834 Body mass index (BMI) 34.0-34.9, adult: Secondary | ICD-10-CM | POA: Diagnosis not present

## 2018-04-27 IMAGING — US US CAROTID DUPLEX BILAT
1 series · 13 of 24 positions shown · non-contrast
Comparison: No prior duplex

CLINICAL DATA: 66-year-old female with a history of preoperative
exam for back surgery

EXAM:
BILATERAL CAROTID DUPLEX ULTRASOUND
TECHNIQUE: Gray scale imaging, color Doppler and duplex ultrasound were
performed of bilateral carotid and vertebral arteries in the neck.

[Series 1: us carotid duplex bilat · 0.06mm/px · 13 of 69 slices shown]
[im 1/69]
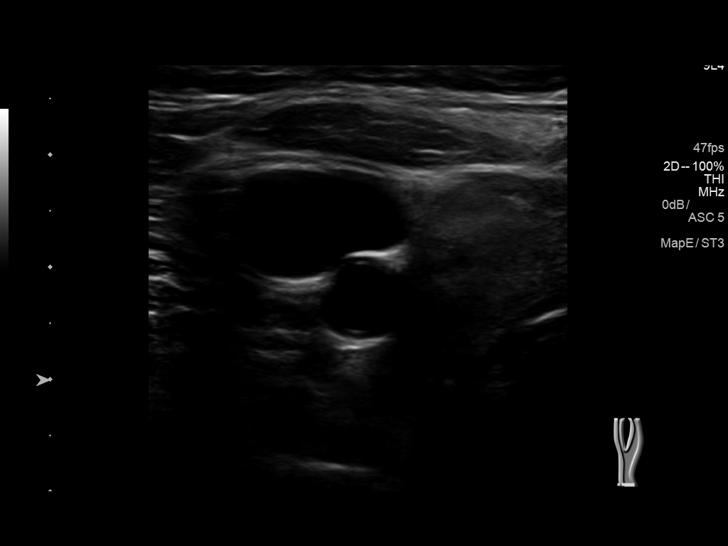
[im 6/69]
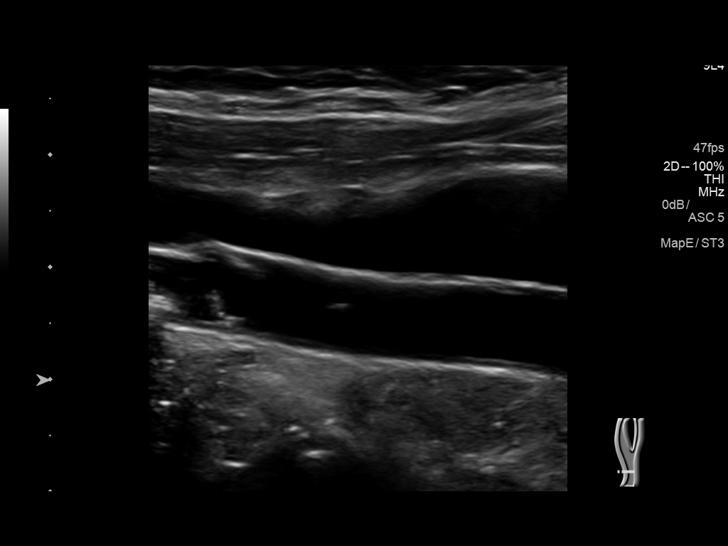
[im 12/69]
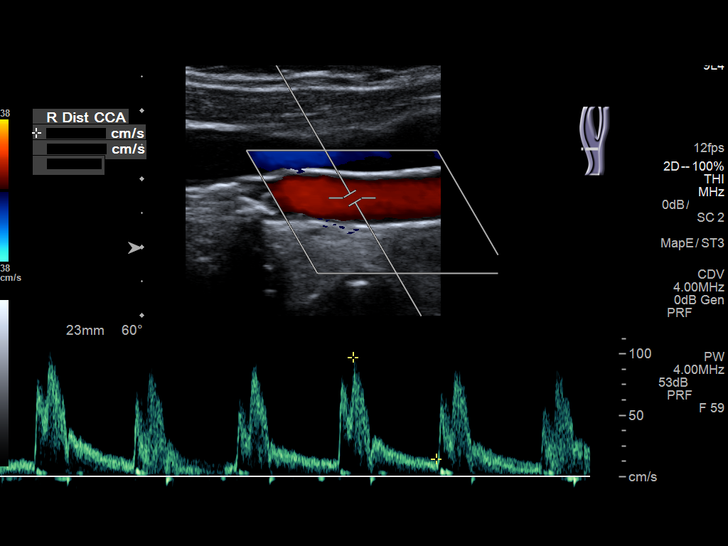
[im 18/69]
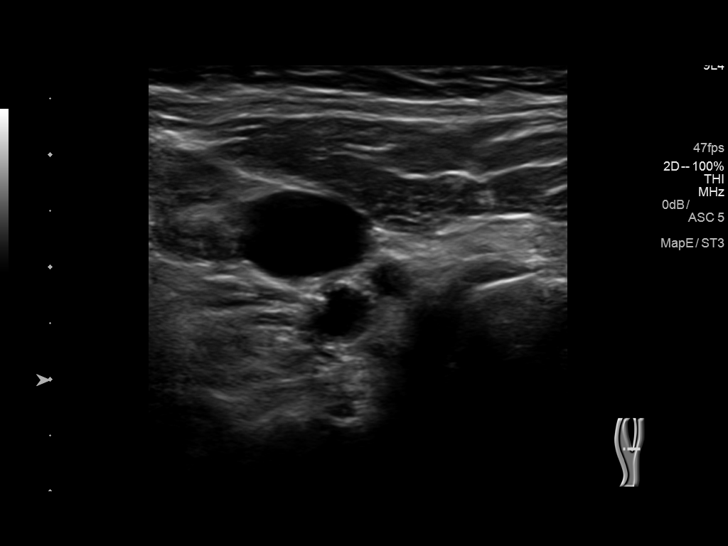
[im 24/69]
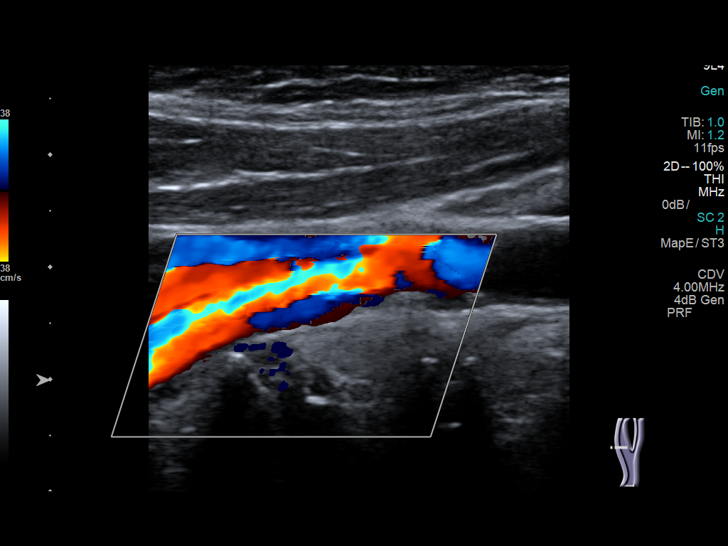
[im 30/69]
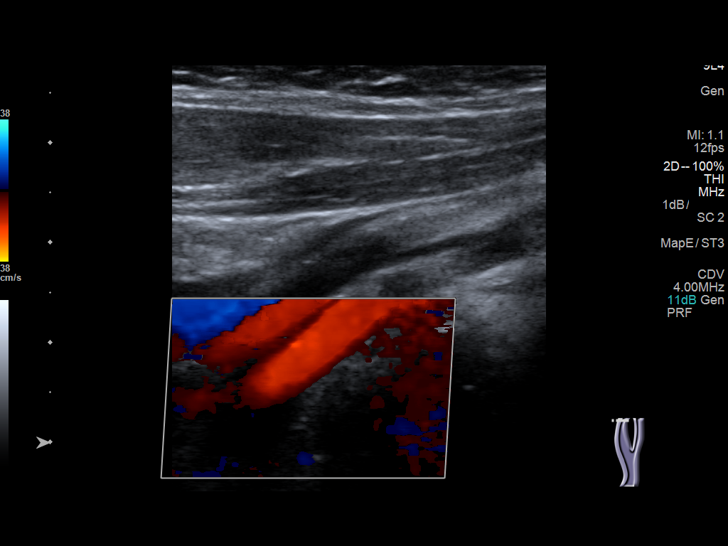
[im 36/69]
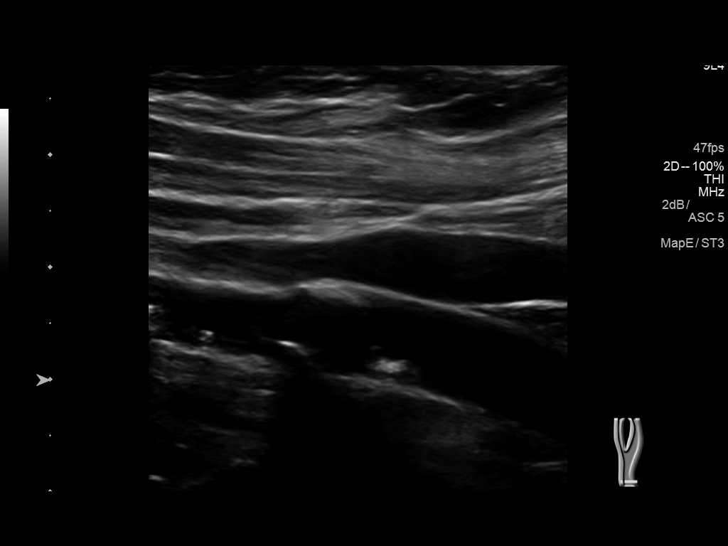
[im 39/69]
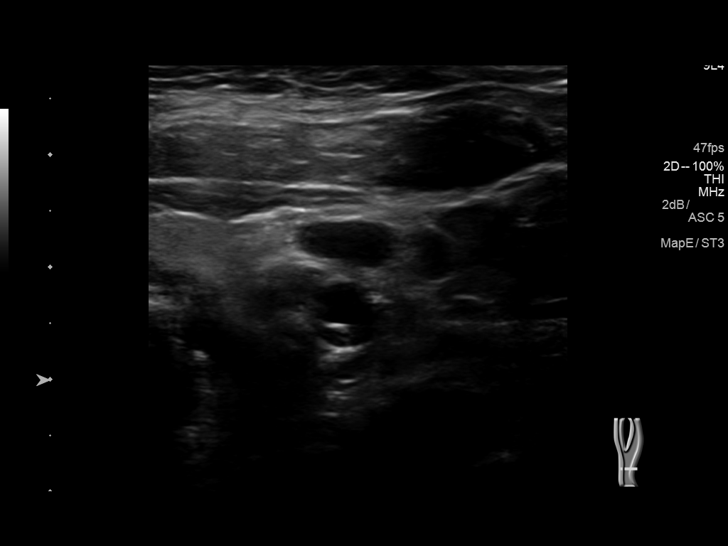
[im 45/69]
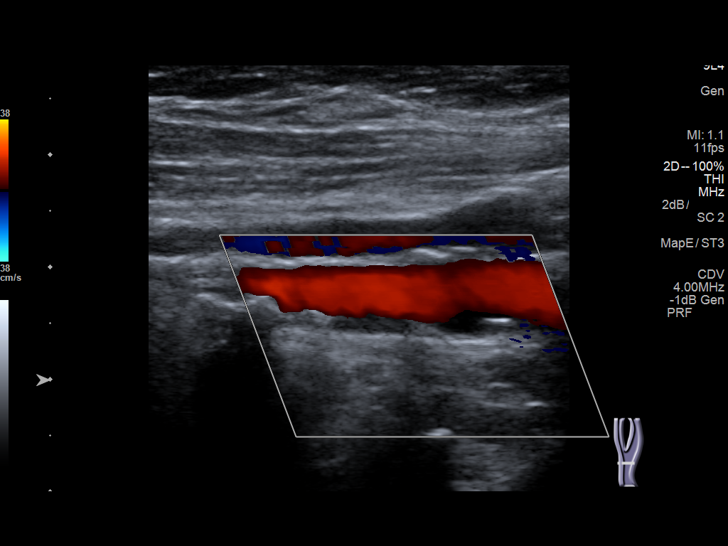
[im 51/69]
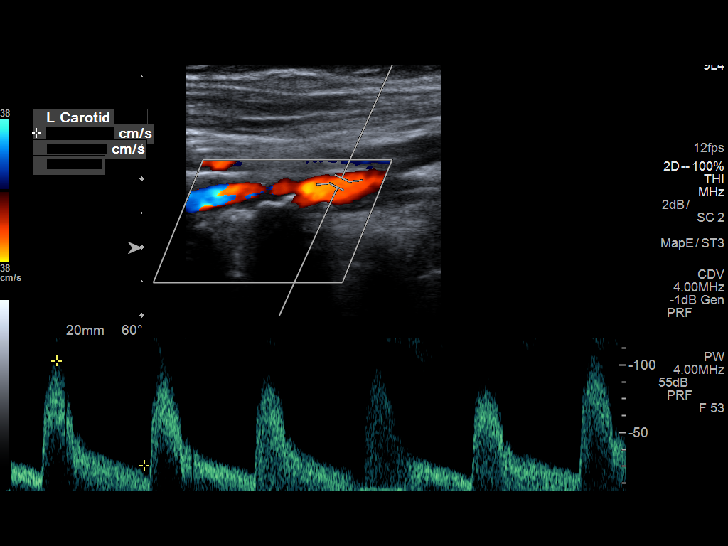
[im 57/69]
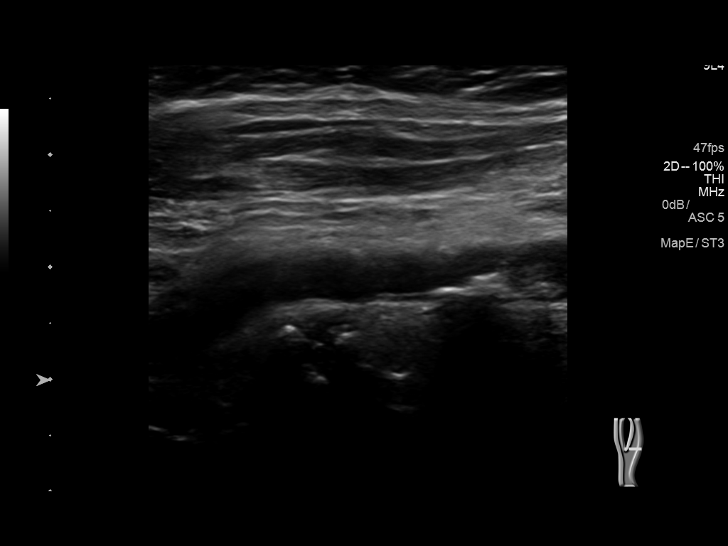
[im 63/69]
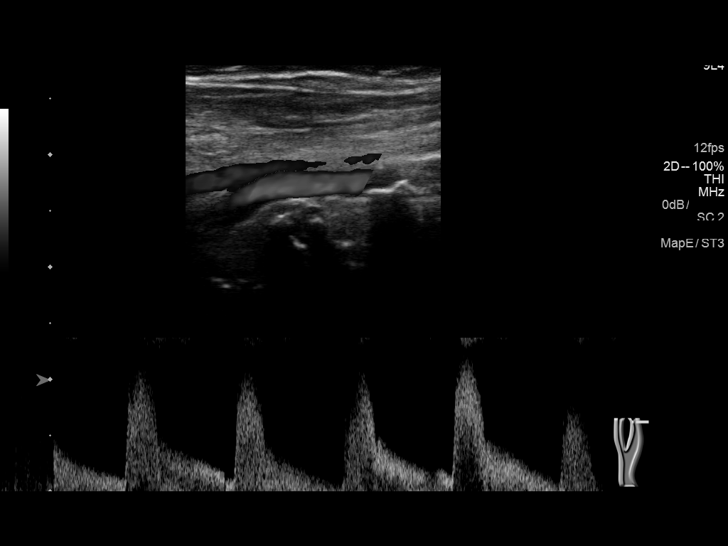
[im 69/69]
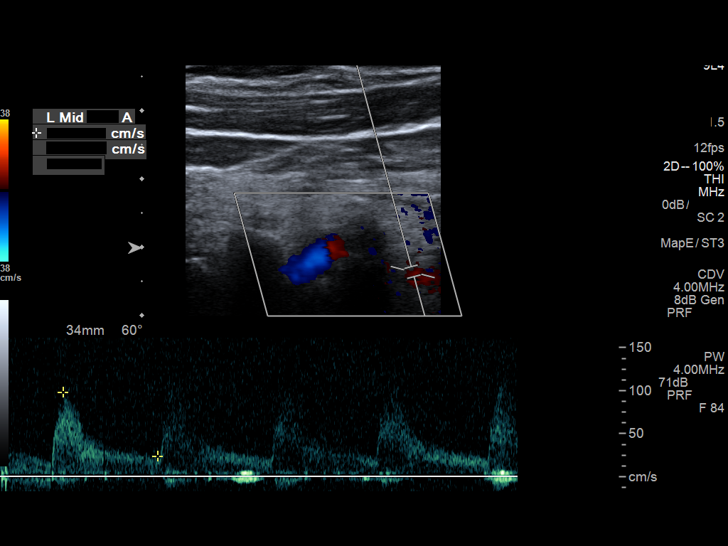

[13 of 24 positions shown; findings below may reference images not displayed]

FINDINGS: Criteria: Quantification of carotid stenosis is based on velocity
parameters that correlate the residual internal carotid diameter
with NASCET-based stenosis levels, using the diameter of the distal
internal carotid lumen as the denominator for stenosis measurement.

The following velocity measurements were obtained:

RIGHT

ICA:  Systolic 162 cm/sec, Diastolic 35 cm/sec

CCA:  95 cm/sec

SYSTOLIC ICA/CCA RATIO:

ECA:  257 cm/sec

LEFT

ICA:  Systolic 177 cm/sec, Diastolic 36 cm/sec

CCA:  86 cm/sec

SYSTOLIC ICA/CCA RATIO:

ECA:  327 cm/sec

Right Brachial SBP: Not acquired

Left Brachial SBP: Not acquired

RIGHT CAROTID ARTERY: No significant calcifications of the right
common carotid artery. Intermediate waveform maintained.
Heterogeneous and partially calcified plaque at the right carotid
bifurcation. No significant lumen shadowing. Low resistance waveform
of the right ICA. No significant tortuosity.

RIGHT VERTEBRAL ARTERY: Antegrade flow with low resistance waveform.

LEFT CAROTID ARTERY: No significant calcifications of the left
common carotid artery. Intermediate waveform maintained.
Heterogeneous and partially calcified plaque at the left carotid
bifurcation without significant lumen shadowing. Low resistance
waveform of the left ICA. No significant tortuosity.

LEFT VERTEBRAL ARTERY:  Antegrade flow with low resistance waveform.
IMPRESSION: Right:

Heterogeneous plaque at the carotid bifurcation, with discordant
results regarding degree of stenosis by established duplex criteria.
Peak velocity suggests 50% -69% stenosis, with the ICA/ CCA ratio
suggesting a lesser degree of stenosis. If establishing a more
accurate degree of stenosis is required, cerebral angiogram should
be considered, or as a second best test, CTA.

Left:

Heterogeneous and partially calcified plaque at the left carotid
bifurcation contributing to 50%- 69% stenosis by established duplex
criteria.

## 2018-06-05 ENCOUNTER — Ambulatory Visit (INDEPENDENT_AMBULATORY_CARE_PROVIDER_SITE_OTHER): Payer: Medicare Other | Admitting: Orthopaedic Surgery

## 2018-06-05 ENCOUNTER — Ambulatory Visit (INDEPENDENT_AMBULATORY_CARE_PROVIDER_SITE_OTHER): Payer: No Typology Code available for payment source | Admitting: Physician Assistant

## 2018-10-21 ENCOUNTER — Encounter: Payer: Self-pay | Admitting: Cardiology

## 2018-10-21 ENCOUNTER — Telehealth: Payer: Self-pay | Admitting: *Deleted

## 2018-10-21 ENCOUNTER — Encounter: Payer: Self-pay | Admitting: *Deleted

## 2018-10-21 NOTE — Progress Notes (Signed)
Virtual Visit via Telephone Note    Evaluation Performed:  Follow-up visit  This visit type was conducted due to national recommendations for restrictions regarding the COVID-19 Pandemic (e.g. social distancing).  This format is felt to be most appropriate for this patient at this time.  All issues noted in this document were discussed and addressed.  No physical exam was performed (except for noted visual exam findings with Video Visits).  The patient verbally consented for a telehealth phone visit today with [facility] and understands that their insurance company will be billed for the encounter.  Date:  10/22/2018   ID:  [name], DOB [birth year], MRN [id number]  Patient Location:  [place], [place] [address]  Provider location:   [facility]. [address] [place], [place] [address] Phone: [phone]; Fax: [phone]  PCP:  Consuello Masse, MD  Cardiologist:  Satira Sark, MD  Chief Complaint:  Follow-up CAD, symptom screening  History of Present Illness:    [name] is a 68 y.o. female who presents via audio conferencing for a telehealth visit today.  She was most recently seen in the office by Dr. Percival Spanish back in June 2018.  She tells me that she has been doing well overall from a cardiac perspective, no active angina or nitroglycerin use.  She still has an old bottle of nitroglycerin which we will refill.  I reviewed her remaining medications as outlined below.  Blood pressure check was elevated today.  She states that she has not been checking it regularly but is taking her medications as directed.  She has a visit pending to see Dr. Quintin Alto in April and I asked her to check her blood pressure once a day for 2 weeks prior to that visit in case she needed further medication titration.  Last ischemic testing was in June 2018 and low risk as outlined below.  She tells me that her husband has been recently  diagnosed with lung cancer, is undergoing further work-up.  She has been under a lot of stress, they have been married for 52 years.  The patient does not report symptoms concerning for COVID-19 infection (fever, chills, cough, or new shortness of breath).    Prior CV studies:   The following studies were reviewed today:  Echocardiogram 04/27/2015: Normal LV wall thickness with LVEF 60 to 65%, normal right ventricular contraction, mildly sclerotic aortic valve without stenosis, trace mitral regurgitation and tricuspid regurgitation, no pericardial effusion.  Lexiscan Myoview 01/12/2017:  There was no ST segment deviation noted during stress.  Findings consistent with prior antoerlateral myocardial infarction with mild peri-infarct ischemia.  This is a low risk study.  The left ventricular ejection fraction is normal (55-65%).  ECG 05/25/2017: I personally reviewed the tracing which shows sinus bradycardia.  Past Medical History:  Diagnosis Date  . Anxiety   . Arthritis   . Coronary artery disease    LAD Cypher stent with PCI of the diagonal in 2004  . Depression   . Dyslipidemia   . Essential hypertension   . Fibromyalgia   . History of migraine   . Pre-diabetes    Past Surgical History:  Procedure Laterality Date  . ABDOMINAL HYSTERECTOMY     partial first, then full  . BACK SURGERY     01-30-2017 [facility]   . CARDIAC CATHETERIZATION    . CARPAL TUNNEL RELEASE Right   . CHOLECYSTECTOMY    . COLONOSCOPY    .  CORONARY ANGIOPLASTY  2004   stent placed x1   . REPLACEMENT TOTAL KNEE Right   . SHOULDER ARTHROSCOPY Right   . TOTAL HIP ARTHROPLASTY Left   . TOTAL HIP ARTHROPLASTY Right 06/01/2017   Procedure: RIGHT TOTAL HIP ARTHROPLASTY ANTERIOR APPROACH;  Surgeon: Mcarthur Rossetti, MD;  Location: [facility];  Service: Orthopedics;  Laterality: Right;  Needs RNFA  . TUBAL LIGATION       Current Meds  Medication Sig  . acetaminophen (TYLENOL) 500 MG tablet Take 1,000 mg  by mouth every 6 (six) hours as needed for mild pain or moderate pain.  Marland Kitchen ALPRAZolam (XANAX) 0.5 MG tablet Take 0.5 mg by mouth 3 (three) times daily as needed for anxiety.  Marland Kitchen amLODipine (NORVASC) 10 MG tablet Take 10 mg by mouth daily.  Marland Kitchen aspirin EC 81 MG tablet Take 81 mg by mouth daily.  Marland Kitchen atorvastatin (LIPITOR) 10 MG tablet Take 10 mg by mouth at bedtime.  . cholecalciferol (VITAMIN D) 1000 units tablet Take 1,000 Units by mouth daily.  . clobetasol (TEMOVATE) 0.05 % external solution Apply 1 application topically 2 (two) times daily. scalp  . cyclobenzaprine (FLEXERIL) 10 MG tablet Take 10 mg by mouth 3 (three) times daily as needed for muscle spasms.  . furosemide (LASIX) 40 MG tablet Take 40 mg by mouth daily.  Marland Kitchen gabapentin (NEURONTIN) 400 MG capsule Take 800 mg by mouth 2 (two) times daily.  Marland Kitchen lisinopril-hydrochlorothiazide (PRINZIDE,ZESTORETIC) 10-12.5 MG tablet Take 1 tablet by mouth daily.  . Multiple Vitamins-Minerals (MULTIVITAMIN WITH MINERALS) tablet Take 1 tablet by mouth daily. Women's One-A-Day  . triamcinolone (KENALOG) 0.1 % paste Use as directed 1 application in the mouth or throat 2 (two) times daily as needed (mouth ulcers).     Allergies:   Iodine; Adhesive [tape]; Niacin; and Penicillins   Social History   Tobacco Use  . Smoking status: Former Smoker    Packs/day: 0.75    Years: 45.00    Pack years: 33.75    Types: Cigarettes    Last attempt to quit: 01/30/2017    Years since quitting: 1.7  . Smokeless tobacco: Never Used  Substance Use Topics  . Alcohol use: Yes    Comment: occasional glass of wine  . Drug use: No     Family Hx: The patient's family history includes Heart attack (age of onset: 70) in her father; Lung cancer in her brother.  ROS:   Please see the history of present illness.    Flare of fibromyalgia pain. All other systems reviewed and are negative.   Labs/Other Tests and Data Reviewed:    Recent Labs:  November 2018: Potassium  3.9, BUN 11, creatinine 1.05, hemoglobin 10.7, platelets 274, hemoglobin A1c 5.8%  Wt Readings from Last 3 Encounters:  10/22/18 186 lb (84.4 kg)  06/01/17 186 lb (84.4 kg)  05/25/17 186 lb (84.4 kg)     Exam:    Vital Signs:  BP (!) 152/80   Pulse 80   Ht 5\' 2"  (1.575 m)   Wt 186 lb (84.4 kg)   BMI 34.02 kg/m    ASSESSMENT & PLAN:    1.  CAD status post DES to the LAD and angioplasty of the diagonal in 2004.  She does not report any active angina at this time on medical therapy.  Ischemic testing from 2018 was low risk as outlined above.  We will continue with plan for observation.  Refill provided for nitroglycerin.  Anticipate follow-up in 1  year with ECG.  She will see her PCP for a routine physical in April.  2.  Essential hypertension, blood pressure is elevated.  She reports compliance with medication including lisinopril HCT and Norvasc.  Start to record blood pressure once a day for 2 weeks prior to seeing Dr. Quintin Alto in April as further medication adjustments may be needed.  Lisinopril HCT could likely be further uptitrated.  3.  Mixed hyperlipidemia, continues on Lipitor.  She is due for follow-up physical with lab work per Dr. Quintin Alto in April. Would aim for LDL around 70.  COVID-19 Education: The signs and symptoms of COVID-19 were discussed with the patient and how to seek care for testing (follow up with PCP or arrange E-visit).  The importance of social distancing was discussed today.  Patient Risk:   After full review of this patients clinical status, I feel that they are at least moderate risk at this time.  Time:   Today, I have spent 7 minutes with the patient with telehealth technology discussing cardiac symptom screening and medications.     Medication Adjustments/Labs and Tests Ordered: Current medicines are reviewed at length with the patient today.  Concerns regarding medicines are outlined above.  Tests Ordered: No orders of the defined types were placed  in this encounter.  Medication Changes: Meds ordered this encounter  Medications  . nitroGLYCERIN (NITROSTAT) 0.4 MG SL tablet    Sig: Place 1 tablet (0.4 mg total) under the tongue every 5 (five) minutes x 3 doses as needed for chest pain (if no relief after 3rd dose, proceed to the ED or call 911).    Dispense:  25 tablet    Refill:  3    Disposition:  Follow up 1 year  Signed, Rozann Lesches, MD  10/22/2018 3:17 PM    [place]

## 2018-10-21 NOTE — Telephone Encounter (Signed)
Chart reviewed with patient for telephone visit on 10/22/2018 with Dr. Domenic Polite.    The patient verbally consented for a telehealth phone visit with [facility] and understands that his/her insurance company will be billed for the encounter.  Patient reminded to have weight and vitals ready before the phone call begins.

## 2018-10-22 ENCOUNTER — Encounter: Payer: Self-pay | Admitting: Cardiology

## 2018-10-22 ENCOUNTER — Telehealth (INDEPENDENT_AMBULATORY_CARE_PROVIDER_SITE_OTHER): Payer: Medicare Other | Admitting: Cardiology

## 2018-10-22 VITALS — BP 152/80 | HR 80 | Ht 62.0 in | Wt 186.0 lb

## 2018-10-22 DIAGNOSIS — I25119 Atherosclerotic heart disease of native coronary artery with unspecified angina pectoris: Secondary | ICD-10-CM | POA: Diagnosis not present

## 2018-10-22 DIAGNOSIS — E782 Mixed hyperlipidemia: Secondary | ICD-10-CM | POA: Diagnosis not present

## 2018-10-22 DIAGNOSIS — I10 Essential (primary) hypertension: Secondary | ICD-10-CM | POA: Diagnosis not present

## 2018-10-22 MED ORDER — NITROGLYCERIN 0.4 MG SL SUBL: 0.4000 mg | SUBLINGUAL_TABLET | SUBLINGUAL | 3 refills | Status: AC | PRN

## 2018-10-22 NOTE — Patient Instructions (Addendum)
Medication Instructions:   Your physician recommends that you continue on your current medications as directed. Please refer to the Current Medication list given to you today.  New prescription sent to your pharmacy today for nitroglycerin 0.4 mg under your tongue every 5 minutes up to 3 doses for chest pain. If no relief after the 3rd dose, proceed to the ED or call 911.  Labwork:  NONE-have lab work sent to our office when its done by your family doctor  Testing/Procedures:  NONE  Follow-Up:  Your physician recommends that you schedule a follow-up appointment in: 1 year. You will receive a reminder letter in the mail in about months 10 reminding you to call and schedule your appointment. If you don't receive this letter, please contact our office.  Any Other Special Instructions Will Be Listed Below (If Applicable).  Please check your blood pressure at home daily for 2 weeks and take these readings with you at your upcoming visit with Dr. Quintin Alto.  If you need a refill on your cardiac medications before your next appointment, please call your pharmacy.

## 2018-11-21 ENCOUNTER — Encounter: Payer: Self-pay | Admitting: *Deleted

## 2018-12-05 DIAGNOSIS — N760 Acute vaginitis: Secondary | ICD-10-CM | POA: Diagnosis not present

## 2018-12-05 DIAGNOSIS — Z6837 Body mass index (BMI) 37.0-37.9, adult: Secondary | ICD-10-CM | POA: Diagnosis not present

## 2018-12-05 DIAGNOSIS — N3281 Overactive bladder: Secondary | ICD-10-CM | POA: Diagnosis not present

## 2018-12-05 DIAGNOSIS — R300 Dysuria: Secondary | ICD-10-CM | POA: Diagnosis not present

## 2019-02-17 DIAGNOSIS — E559 Vitamin D deficiency, unspecified: Secondary | ICD-10-CM | POA: Diagnosis not present

## 2019-02-17 DIAGNOSIS — R739 Hyperglycemia, unspecified: Secondary | ICD-10-CM | POA: Diagnosis not present

## 2019-02-17 DIAGNOSIS — I10 Essential (primary) hypertension: Secondary | ICD-10-CM | POA: Diagnosis not present

## 2019-02-17 DIAGNOSIS — E7800 Pure hypercholesterolemia, unspecified: Secondary | ICD-10-CM | POA: Diagnosis not present

## 2019-02-17 DIAGNOSIS — Z7901 Long term (current) use of anticoagulants: Secondary | ICD-10-CM | POA: Diagnosis not present

## 2019-02-21 DIAGNOSIS — I10 Essential (primary) hypertension: Secondary | ICD-10-CM | POA: Diagnosis not present

## 2019-02-21 DIAGNOSIS — Z6836 Body mass index (BMI) 36.0-36.9, adult: Secondary | ICD-10-CM | POA: Diagnosis not present

## 2019-02-21 DIAGNOSIS — F419 Anxiety disorder, unspecified: Secondary | ICD-10-CM | POA: Diagnosis not present

## 2019-02-21 DIAGNOSIS — R7303 Prediabetes: Secondary | ICD-10-CM | POA: Diagnosis not present

## 2019-02-21 DIAGNOSIS — L639 Alopecia areata, unspecified: Secondary | ICD-10-CM | POA: Diagnosis not present

## 2019-02-21 DIAGNOSIS — M797 Fibromyalgia: Secondary | ICD-10-CM | POA: Diagnosis not present

## 2019-02-21 DIAGNOSIS — N183 Chronic kidney disease, stage 3 (moderate): Secondary | ICD-10-CM | POA: Diagnosis not present

## 2019-02-21 DIAGNOSIS — E7800 Pure hypercholesterolemia, unspecified: Secondary | ICD-10-CM | POA: Diagnosis not present

## 2019-03-13 DIAGNOSIS — H40033 Anatomical narrow angle, bilateral: Secondary | ICD-10-CM | POA: Diagnosis not present

## 2019-03-13 DIAGNOSIS — H2513 Age-related nuclear cataract, bilateral: Secondary | ICD-10-CM | POA: Diagnosis not present

## 2019-04-30 DIAGNOSIS — Z23 Encounter for immunization: Secondary | ICD-10-CM | POA: Diagnosis not present

## 2019-06-13 IMAGING — NM NM MYOCAR MULTI W/SPECT W/WALL MOTION & EF
2 series · 12 of 12 positions shown · non-contrast
Comparison: none

[Series 1: rest · 6.51mm/px · 6 of 64 frames shown]
[frame 6/64]
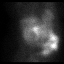
[frame 16/64]
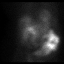
[frame 27/64]
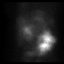
[frame 38/64]
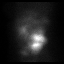
[frame 48/64]
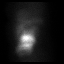
[frame 59/64]
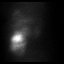

[Series 2: stress gated · 6.51mm/px · 6 of 64 frames shown]
[frame 6/64]
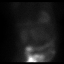
[frame 16/64]
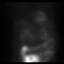
[frame 27/64]
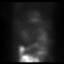
[frame 38/64]
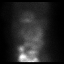
[frame 48/64]
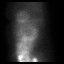
[frame 59/64]
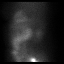

[12 of 12 positions shown; findings below may reference images not displayed]

Canned report from images found in remote index.

Refer to host system for actual result text.

## 2019-06-18 ENCOUNTER — Other Ambulatory Visit: Payer: Self-pay

## 2019-07-28 DIAGNOSIS — J209 Acute bronchitis, unspecified: Secondary | ICD-10-CM | POA: Diagnosis not present

## 2019-08-18 DIAGNOSIS — E7800 Pure hypercholesterolemia, unspecified: Secondary | ICD-10-CM | POA: Diagnosis not present

## 2019-08-18 DIAGNOSIS — I10 Essential (primary) hypertension: Secondary | ICD-10-CM | POA: Diagnosis not present

## 2019-08-18 DIAGNOSIS — R7303 Prediabetes: Secondary | ICD-10-CM | POA: Diagnosis not present

## 2019-08-21 DIAGNOSIS — Z23 Encounter for immunization: Secondary | ICD-10-CM | POA: Diagnosis not present

## 2019-08-21 DIAGNOSIS — F419 Anxiety disorder, unspecified: Secondary | ICD-10-CM | POA: Diagnosis not present

## 2019-08-21 DIAGNOSIS — Z6836 Body mass index (BMI) 36.0-36.9, adult: Secondary | ICD-10-CM | POA: Diagnosis not present

## 2019-08-21 DIAGNOSIS — R7303 Prediabetes: Secondary | ICD-10-CM | POA: Diagnosis not present

## 2019-08-21 DIAGNOSIS — I10 Essential (primary) hypertension: Secondary | ICD-10-CM | POA: Diagnosis not present

## 2019-08-21 DIAGNOSIS — Z0001 Encounter for general adult medical examination with abnormal findings: Secondary | ICD-10-CM | POA: Diagnosis not present

## 2019-08-21 DIAGNOSIS — L639 Alopecia areata, unspecified: Secondary | ICD-10-CM | POA: Diagnosis not present

## 2019-09-04 DIAGNOSIS — Z23 Encounter for immunization: Secondary | ICD-10-CM | POA: Diagnosis not present

## 2019-10-03 DIAGNOSIS — Z23 Encounter for immunization: Secondary | ICD-10-CM | POA: Diagnosis not present

## 2019-10-09 DIAGNOSIS — M722 Plantar fascial fibromatosis: Secondary | ICD-10-CM | POA: Diagnosis not present

## 2019-10-09 DIAGNOSIS — M79672 Pain in left foot: Secondary | ICD-10-CM | POA: Diagnosis not present

## 2019-10-22 ENCOUNTER — Emergency Department (HOSPITAL_COMMUNITY)
Admission: EM | Admit: 2019-10-22 | Discharge: 2019-10-22 | Disposition: A | Discharge status: Home / Self Care | Payer: Medicare Other | Attending: Emergency Medicine | Admitting: Emergency Medicine

## 2019-10-22 ENCOUNTER — Encounter (INDEPENDENT_AMBULATORY_CARE_PROVIDER_SITE_OTHER): Payer: Self-pay

## 2019-10-22 ENCOUNTER — Other Ambulatory Visit: Payer: Self-pay

## 2019-10-22 ENCOUNTER — Encounter (HOSPITAL_COMMUNITY): Payer: Self-pay | Admitting: Emergency Medicine

## 2019-10-22 ENCOUNTER — Emergency Department (HOSPITAL_COMMUNITY): Payer: Medicare Other

## 2019-10-22 DIAGNOSIS — Z955 Presence of coronary angioplasty implant and graft: Secondary | ICD-10-CM | POA: Insufficient documentation

## 2019-10-22 DIAGNOSIS — Z96643 Presence of artificial hip joint, bilateral: Secondary | ICD-10-CM | POA: Insufficient documentation

## 2019-10-22 DIAGNOSIS — Z7982 Long term (current) use of aspirin: Secondary | ICD-10-CM | POA: Diagnosis not present

## 2019-10-22 DIAGNOSIS — I10 Essential (primary) hypertension: Secondary | ICD-10-CM | POA: Insufficient documentation

## 2019-10-22 DIAGNOSIS — Y999 Unspecified external cause status: Secondary | ICD-10-CM | POA: Insufficient documentation

## 2019-10-22 DIAGNOSIS — Y9389 Activity, other specified: Secondary | ICD-10-CM | POA: Diagnosis not present

## 2019-10-22 DIAGNOSIS — S92155A Nondisplaced avulsion fracture (chip fracture) of left talus, initial encounter for closed fracture: Secondary | ICD-10-CM | POA: Insufficient documentation

## 2019-10-22 DIAGNOSIS — I2510 Atherosclerotic heart disease of native coronary artery without angina pectoris: Secondary | ICD-10-CM | POA: Diagnosis not present

## 2019-10-22 DIAGNOSIS — Z87891 Personal history of nicotine dependence: Secondary | ICD-10-CM | POA: Diagnosis not present

## 2019-10-22 DIAGNOSIS — M7989 Other specified soft tissue disorders: Secondary | ICD-10-CM | POA: Diagnosis not present

## 2019-10-22 DIAGNOSIS — Z79899 Other long term (current) drug therapy: Secondary | ICD-10-CM | POA: Diagnosis not present

## 2019-10-22 DIAGNOSIS — Z96651 Presence of right artificial knee joint: Secondary | ICD-10-CM | POA: Insufficient documentation

## 2019-10-22 DIAGNOSIS — S4991XA Unspecified injury of right shoulder and upper arm, initial encounter: Secondary | ICD-10-CM

## 2019-10-22 DIAGNOSIS — W010XXA Fall on same level from slipping, tripping and stumbling without subsequent striking against object, initial encounter: Secondary | ICD-10-CM | POA: Diagnosis not present

## 2019-10-22 DIAGNOSIS — S99922A Unspecified injury of left foot, initial encounter: Secondary | ICD-10-CM | POA: Diagnosis not present

## 2019-10-22 DIAGNOSIS — M25511 Pain in right shoulder: Secondary | ICD-10-CM | POA: Diagnosis not present

## 2019-10-22 DIAGNOSIS — S4992XA Unspecified injury of left shoulder and upper arm, initial encounter: Secondary | ICD-10-CM | POA: Diagnosis not present

## 2019-10-22 DIAGNOSIS — Y92010 Kitchen of single-family (private) house as the place of occurrence of the external cause: Secondary | ICD-10-CM | POA: Diagnosis not present

## 2019-10-22 DIAGNOSIS — S82892A Other fracture of left lower leg, initial encounter for closed fracture: Secondary | ICD-10-CM

## 2019-10-22 DIAGNOSIS — S4981XA Other specified injuries of right shoulder and upper arm, initial encounter: Secondary | ICD-10-CM | POA: Insufficient documentation

## 2019-10-22 DIAGNOSIS — S99912A Unspecified injury of left ankle, initial encounter: Secondary | ICD-10-CM | POA: Diagnosis not present

## 2019-10-22 MED ORDER — TRAMADOL HCL 50 MG PO TABS: 50.0000 mg | ORAL_TABLET | Freq: Four times a day (QID) | ORAL | 0 refills | Status: DC | PRN

## 2019-10-22 NOTE — Discharge Instructions (Addendum)
Your x-ray today shows a small fracture to both sides of your ankle.  Elevate your foot when possible.  call Dr. Ruthe Mannan office to arrange a follow-up appointment.

## 2019-10-22 NOTE — ED Triage Notes (Signed)
Patient tripped and fell on 10-13-19, currently having right shoulder pain and left foot pain, unable to tolerate bearing weight on left foot.

## 2019-10-22 NOTE — ED Provider Notes (Signed)
[facility] Provider Note   CSN: [id number] Arrival date & time: 10/22/19  1533     History Chief Complaint  Patient presents with  . Fall    [name] is a 69 y.o. female.  HPI      [name] is a 69 y.o. female who presents to the Emergency Department complaining of right shoulder and left foot pain secondary to a mechanical fall that occurred on 10/13/2019.  She describes a twisting of her foot which resulted in a fall in her kitchen, landing on her right shoulder.  She continues to have pain to her right shoulder and difficulty abducting her arm.  She also reports having immediate pain of her left ankle and swelling.  She is having difficulty with weightbearing on her left foot.  She is continues to have swelling despite elevation and ice.  She denies numbness of her foot or arm.  She also denies head injury, LOC, headaches, numbness or dizziness.  She has been taking Tylenol with minimal to no relief.  She does not take blood thinners.    Past Medical History:  Diagnosis Date  . Anxiety   . Arthritis   . Coronary artery disease    LAD Cypher stent with PCI of the diagonal in 2004  . Depression   . Dyslipidemia   . Essential hypertension   . Fibromyalgia   . History of migraine   . Pre-diabetes     Patient Active Problem List   Diagnosis Date Noted  . Unilateral primary osteoarthritis, right hip 06/01/2017  . Status post total replacement of right hip 06/01/2017  . Spondylolisthesis at L4-L5 level 01/30/2017  . Bruit 01/11/2017  . Preop cardiovascular exam 01/11/2017  . Coronary artery disease involving native heart with angina pectoris ([place]) 01/11/2017  . TENDINITIS, ELBOW 03/18/2008  . TOTAL KNEE FOLLOW-UP 09/24/2007  . Osteoarthrosis, unspecified whether generalized or localized, lower leg 04/12/2007  . KNEE PAIN 04/12/2007    Past Surgical History:  Procedure Laterality Date  . ABDOMINAL HYSTERECTOMY     partial first, then  full  . BACK SURGERY     01-30-2017 [facility]   . CARDIAC CATHETERIZATION    . CARPAL TUNNEL RELEASE Right   . CHOLECYSTECTOMY    . COLONOSCOPY    . CORONARY ANGIOPLASTY  2004   stent placed x1   . REPLACEMENT TOTAL KNEE Right   . SHOULDER ARTHROSCOPY Right   . TOTAL HIP ARTHROPLASTY Left   . TOTAL HIP ARTHROPLASTY Right 06/01/2017   Procedure: RIGHT TOTAL HIP ARTHROPLASTY ANTERIOR APPROACH;  Surgeon: Mcarthur Rossetti, MD;  Location: [facility];  Service: Orthopedics;  Laterality: Right;  Needs RNFA  . TUBAL LIGATION       OB History   No obstetric history on file.     Family History  Problem Relation Age of Onset  . Heart attack Father 53  . Lung cancer Brother     Social History   Tobacco Use  . Smoking status: Former Smoker    Packs/day: 0.75    Years: 45.00    Pack years: 33.75    Types: Cigarettes    Quit date: 01/30/2017    Years since quitting: 2.7  . Smokeless tobacco: Never Used  Substance Use Topics  . Alcohol use: Yes    Comment: occasional glass of wine  . Drug use: No    Home Medications Prior to Admission medications   Medication Sig Start Date End Date Taking?  Authorizing Provider  acetaminophen (TYLENOL) 500 MG tablet Take 1,000 mg by mouth every 6 (six) hours as needed for mild pain or moderate pain.    [provider]  ALPRAZolam Duanne Moron) 0.5 MG tablet Take 0.5 mg by mouth 3 (three) times daily as needed for anxiety.    [provider]  amLODipine (NORVASC) 10 MG tablet Take 10 mg by mouth daily.    [provider]  aspirin EC 81 MG tablet Take 81 mg by mouth daily.    [provider]  atorvastatin (LIPITOR) 10 MG tablet Take 10 mg by mouth at bedtime.    [provider]  cholecalciferol (VITAMIN D) 1000 units tablet Take 1,000 Units by mouth daily.    [provider]  clobetasol (TEMOVATE) 0.05 % external solution Apply 1 application topically 2 (two) times daily. scalp    [provider]   cyclobenzaprine (FLEXERIL) 10 MG tablet Take 10 mg by mouth 3 (three) times daily as needed for muscle spasms.    [provider]  furosemide (LASIX) 40 MG tablet Take 40 mg by mouth daily.    [provider]  gabapentin (NEURONTIN) 400 MG capsule Take 800 mg by mouth 2 (two) times daily.    [provider]  lisinopril-hydrochlorothiazide (PRINZIDE,ZESTORETIC) 10-12.5 MG tablet Take 1 tablet by mouth daily.    [provider]  Multiple Vitamins-Minerals (MULTIVITAMIN WITH MINERALS) tablet Take 1 tablet by mouth daily. Women's One-A-Day    [provider]  nitroGLYCERIN (NITROSTAT) 0.4 MG SL tablet Place 1 tablet (0.4 mg total) under the tongue every 5 (five) minutes x 3 doses as needed for chest pain (if no relief after 3rd dose, proceed to the ED or call 911). 10/22/18 01/20/19  Satira Sark, MD  traMADol (ULTRAM) 50 MG tablet Take 1 tablet (50 mg total) by mouth every 6 (six) hours as needed. 10/22/19   Moriya Mitchell, PA-C  triamcinolone (KENALOG) 0.1 % paste Use as directed 1 application in the mouth or throat 2 (two) times daily as needed (mouth ulcers).    [provider]    Allergies    Iodine, Adhesive [tape], Niacin, and Penicillins  Review of Systems   Review of Systems  Constitutional: Negative for chills and fever.  Respiratory: Negative for wheezing.   Cardiovascular: Negative for chest pain.  Gastrointestinal: Negative for nausea and vomiting.  Musculoskeletal: Positive for arthralgias (Right shoulder pain, left ankle pain) and joint swelling. Negative for neck pain and neck stiffness.  Skin: Negative for color change and wound.  Neurological: Negative for dizziness, syncope, weakness, light-headedness and numbness.    Physical Exam Updated Vital Signs BP 119/62   Pulse 63   Temp 98.5 F (36.9 C) (Oral)   Resp 20   Ht 5' 2.5" (1.588 m)   Wt 86.2 kg   SpO2 100%   BMI 34.20 kg/m   Physical Exam Vitals and  nursing note reviewed.  Constitutional:      General: She is not in acute distress.    Appearance: Normal appearance. She is not ill-appearing.  HENT:     Head: Atraumatic.  Cardiovascular:     Rate and Rhythm: Normal rate and regular rhythm.     Pulses: Normal pulses.  Pulmonary:     Effort: Pulmonary effort is normal.  Chest:     Chest wall: No tenderness.  Musculoskeletal:        General: Swelling, tenderness and signs of injury present. No deformity.  Cervical back: Normal range of motion. No tenderness.     Comments: Diffuse tenderness to palpation of the medial and lateral aspects of the left ankle.  Moderate edema noted of the lateral ankle.  Left foot is nontender and there is no tenderness or edema proximal to the ankle.  Compartments are soft.  Patient also has diffuse tenderness through range of motion of the right shoulder.  No bony deformities.  Right elbow is nontender.  Skin:    General: Skin is warm.     Capillary Refill: Capillary refill takes less than 2 seconds.  Neurological:     General: No focal deficit present.     Mental Status: She is alert.     Sensory: No sensory deficit.     Motor: No weakness.     ED Results / Procedures / Treatments   Labs (all labs ordered are listed, but only abnormal results are displayed) Labs Reviewed - No data to display  EKG None  Radiology DG Shoulder Right  Result Date: 10/22/2019 CLINICAL DATA:  Fall with right shoulder pain EXAM: RIGHT SHOULDER - 2+ VIEW COMPARISON:  Chest x-ray 12/26/2010 FINDINGS: No fracture or dislocation. Postsurgical changes at the right humeral head and distal clavicle. Mild degenerative change at the glenohumeral interval. IMPRESSION: No acute osseous abnormality Electronically Signed   By: Donavan Foil M.D.   On: 10/22/2019 16:50   DG Ankle Complete Left  Result Date: 10/22/2019 CLINICAL DATA:  Fall EXAM: LEFT ANKLE COMPLETE - 3+ VIEW COMPARISON:  None. FINDINGS: Diffuse soft tissue  swelling is present. The mortise is symmetric. Age indeterminate tiny avulsion is at the medial malleolus and lateral fibular malleolar tip. Mild degenerative spurring IMPRESSION: Soft tissue swelling with suspected age indeterminate small avulsion is a off the medial malleolus and tip of the lateral fibular malleolus Electronically Signed   By: Donavan Foil M.D.   On: 10/22/2019 16:53   DG Foot Complete Left  Result Date: 10/22/2019 CLINICAL DATA:  Fall EXAM: LEFT FOOT - COMPLETE 3+ VIEW COMPARISON:  None. FINDINGS: No fracture or malalignment. Moderate plantar calcaneal spur. Joint spaces are maintained. IMPRESSION: No acute osseous abnormality Electronically Signed   By: Donavan Foil M.D.   On: 10/22/2019 16:51    Procedures Procedures (including critical care time)  Medications Ordered in ED Medications - No data to display  ED Course  I have reviewed the triage vital signs and the nursing notes.  Pertinent labs & imaging results that were available during my care of the patient were reviewed by me and considered in my medical decision making (see chart for details).    MDM Rules/Calculators/A&P                      Patient with reported history of mechanical fall that occurred greater than 1 week ago.  X-rays show a avulsion fracture of the medial and fibular malleolus.  Neurovascularly intact.  Compartments are soft.  Patient placed in a cam walker boot.  She is requesting orthopedic follow-up with Dr. Aline Brochure, referral information provided.   Final Clinical Impression(s) / ED Diagnoses Final diagnoses:  Closed avulsion fracture of left ankle, initial encounter  Injury of right shoulder, initial encounter    Rx / DC Orders ED Discharge Orders         Ordered    traMADol (ULTRAM) 50 MG tablet  Every 6 hours PRN     10/22/19 1825  Kem Parkinson, PA-C 10/22/19 1837    Milton Ferguson, MD 10/22/19 2321

## 2019-10-23 ENCOUNTER — Encounter: Payer: Self-pay | Admitting: Orthopaedic Surgery

## 2019-10-23 ENCOUNTER — Ambulatory Visit (INDEPENDENT_AMBULATORY_CARE_PROVIDER_SITE_OTHER): Payer: Medicare Other | Admitting: Orthopaedic Surgery

## 2019-10-23 VITALS — BP 147/58 | HR 77 | Ht 62.5 in | Wt 190.0 lb

## 2019-10-23 DIAGNOSIS — M25511 Pain in right shoulder: Secondary | ICD-10-CM | POA: Diagnosis not present

## 2019-10-23 DIAGNOSIS — S82842A Displaced bimalleolar fracture of left lower leg, initial encounter for closed fracture: Secondary | ICD-10-CM

## 2019-10-23 NOTE — Progress Notes (Signed)
Subjective:    Patient ID: [name], female    DOB: May 19, 1951, 69 y.o.   MRN: [id number]  HPI She fell at home and hurt her right shoulder and left ankle yesterday.  She was seen in the ER.  She had x-rays of the right shoulder which were negative, and the left ankle which showed: IMPRESSION: Soft tissue swelling with suspected age indeterminate small avulsion is a off the medial malleolus and tip of the lateral fibular malleolus  I have reviewed the ER records.  I have independently reviewed and interpreted x-rays of this patient done at another site by another physician or qualified health professional.  She was given CAM walker on the left.  She uses a walker normally.  She was given Toradol for pain which helps.  Review of Systems  Constitutional: Positive for activity change.  Musculoskeletal: Positive for arthralgias, gait problem and joint swelling.  Psychiatric/Behavioral: The patient is nervous/anxious.   All other systems reviewed and are negative.  For Review of Systems, all other systems reviewed and are negative.  The following is a summary of the past history medically, past history surgically, known current medicines, social history and family history.  This information is gathered electronically by the computer from prior information and documentation.  I review this each visit and have found including this information at this point in the chart is beneficial and informative.   Past Medical History:  Diagnosis Date  . Anxiety   . Arthritis   . Coronary artery disease    LAD Cypher stent with PCI of the diagonal in 2004  . Depression   . Dyslipidemia   . Essential hypertension   . Fibromyalgia   . History of migraine   . Pre-diabetes     Past Surgical History:  Procedure Laterality Date  . ABDOMINAL HYSTERECTOMY     partial first, then full  . BACK SURGERY     01-30-2017 [facility]   . CARDIAC CATHETERIZATION    . CARPAL TUNNEL RELEASE Right   .  CHOLECYSTECTOMY    . COLONOSCOPY    . CORONARY ANGIOPLASTY  2004   stent placed x1   . REPLACEMENT TOTAL KNEE Right   . SHOULDER ARTHROSCOPY Right   . TOTAL HIP ARTHROPLASTY Left   . TOTAL HIP ARTHROPLASTY Right 06/01/2017   Procedure: RIGHT TOTAL HIP ARTHROPLASTY ANTERIOR APPROACH;  Surgeon: Mcarthur Rossetti, MD;  Location: [facility];  Service: Orthopedics;  Laterality: Right;  Needs RNFA  . TUBAL LIGATION      Current Outpatient Medications on File Prior to Visit  Medication Sig Dispense Refill  . acetaminophen (TYLENOL) 500 MG tablet Take 1,000 mg by mouth every 6 (six) hours as needed for mild pain or moderate pain.    Marland Kitchen ALPRAZolam (XANAX) 0.5 MG tablet Take 0.5 mg by mouth 3 (three) times daily as needed for anxiety.    Marland Kitchen amLODipine (NORVASC) 10 MG tablet Take 10 mg by mouth daily.    Marland Kitchen aspirin EC 81 MG tablet Take 81 mg by mouth daily.    Marland Kitchen atorvastatin (LIPITOR) 10 MG tablet Take 10 mg by mouth at bedtime.    . cholecalciferol (VITAMIN D) 1000 units tablet Take 1,000 Units by mouth daily.    . clobetasol (TEMOVATE) 0.05 % external solution Apply 1 application topically 2 (two) times daily. scalp    . cyclobenzaprine (FLEXERIL) 10 MG tablet Take 10 mg by mouth 3 (three) times daily as needed for muscle spasms.    Marland Kitchen  furosemide (LASIX) 40 MG tablet Take 40 mg by mouth daily.    Marland Kitchen gabapentin (NEURONTIN) 400 MG capsule Take 800 mg by mouth 2 (two) times daily.    Marland Kitchen lisinopril-hydrochlorothiazide (PRINZIDE,ZESTORETIC) 10-12.5 MG tablet Take 1 tablet by mouth daily.    . Multiple Vitamins-Minerals (MULTIVITAMIN WITH MINERALS) tablet Take 1 tablet by mouth daily. Women's One-A-Day    . oxybutynin (DITROPAN-XL) 5 MG 24 hr tablet Take 5 mg by mouth at bedtime.    . traMADol (ULTRAM) 50 MG tablet Take 1 tablet (50 mg total) by mouth every 6 (six) hours as needed. 10 tablet 0  . triamcinolone (KENALOG) 0.1 % paste Use as directed 1 application in the mouth or throat 2 (two) times daily as  needed (mouth ulcers).    . nitroGLYCERIN (NITROSTAT) 0.4 MG SL tablet Place 1 tablet (0.4 mg total) under the tongue every 5 (five) minutes x 3 doses as needed for chest pain (if no relief after 3rd dose, proceed to the ED or call 911). 25 tablet 3   No current facility-administered medications on file prior to visit.    Social History   Socioeconomic History  . Marital status: Married    Spouse name: Not on file  . Number of children: 2  . Years of education: Not on file  . Highest education level: Not on file  Occupational History  . Not on file  Tobacco Use  . Smoking status: Former Smoker    Packs/day: 0.75    Years: 45.00    Pack years: 33.75    Types: Cigarettes    Quit date: 01/30/2017    Years since quitting: 2.7  . Smokeless tobacco: Never Used  Substance and Sexual Activity  . Alcohol use: Yes    Comment: occasional glass of wine  . Drug use: No  . Sexual activity: Not on file  Other Topics Concern  . Not on file  Social History Narrative   Lives with husband and mom.    Social Determinants of Health   Financial Resource Strain:   . Difficulty of Paying Living Expenses:   Food Insecurity:   . Worried About Charity fundraiser in the Last Year:   . Arboriculturist in the Last Year:   Transportation Needs:   . Film/video editor (Medical):   Marland Kitchen Lack of Transportation (Non-Medical):   Physical Activity:   . Days of Exercise per Week:   . Minutes of Exercise per Session:   Stress:   . Feeling of Stress :   Social Connections:   . Frequency of Communication with Friends and Family:   . Frequency of Social Gatherings with Friends and Family:   . Attends Religious Services:   . Active Member of Clubs or Organizations:   . Attends Archivist Meetings:   Marland Kitchen Marital Status:   Intimate Partner Violence:   . Fear of Current or Ex-Partner:   . Emotionally Abused:   Marland Kitchen Physically Abused:   . Sexually Abused:     Family History  Problem Relation  Age of Onset  . Heart attack Father 57  . Lung cancer Brother     BP (!) 147/58   Pulse 77   Ht 5' 2.5" (1.588 m)   Wt 190 lb (86.2 kg)   BMI 34.20 kg/m   Body mass index is 34.2 kg/m.     Objective:   Physical Exam Vitals and nursing note reviewed.  Constitutional:  Appearance: She is well-developed.  HENT:     Head: Normocephalic and atraumatic.  Eyes:     Conjunctiva/sclera: Conjunctivae normal.     Pupils: Pupils are equal, round, and reactive to light.  Cardiovascular:     Rate and Rhythm: Normal rate and regular rhythm.  Pulmonary:     Effort: Pulmonary effort is normal.  Abdominal:     Palpations: Abdomen is soft.  Musculoskeletal:       Arms:     Cervical back: Normal range of motion and neck supple.       Feet:  Skin:    General: Skin is warm and dry.  Neurological:     Mental Status: She is alert and oriented to person, place, and time.     Cranial Nerves: No cranial nerve deficit.     Motor: No abnormal muscle tone.     Coordination: Coordination normal.     Deep Tendon Reflexes: Reflexes are normal and symmetric. Reflexes normal.  Psychiatric:        Behavior: Behavior normal.        Thought Content: Thought content normal.        Judgment: Judgment normal.           Assessment & Plan:   Encounter Diagnoses  Name Primary?  . Bimalleolar fracture of left ankle, closed, initial encounter Yes  . Acute pain of right shoulder    Continue the CAM walker.  Contrast bath sheet of instructions given.  Do shoulder exercises.  She had rotator cuff surgery in distant past and knows the exercises to do.  Return in two weeks.  X-rays of the left ankle.  Take the Toradol.  Call if need refill.  Call if any problem.  Precautions discussed.   Electronically Signed Sanjuana Kava, MD 4/1/202111:54 AM

## 2019-10-27 ENCOUNTER — Telehealth: Payer: Self-pay | Admitting: Radiology

## 2019-10-27 NOTE — Telephone Encounter (Signed)
Patient called, said that she had told you that she was getting a prescription of tramadol already.  They only sent in 10 tablets.  She wants to know if you will send in a prescription after all for this medication?   [name].

## 2019-10-28 MED ORDER — TRAMADOL HCL 50 MG PO TABS: ORAL_TABLET | ORAL | 2 refills | Status: AC

## 2019-10-28 NOTE — Addendum Note (Signed)
Addended by: Willette Pa on: 10/28/2019 07:10 AM   Modules accepted: Orders

## 2019-11-06 ENCOUNTER — Other Ambulatory Visit: Payer: Self-pay

## 2019-11-06 ENCOUNTER — Ambulatory Visit (INDEPENDENT_AMBULATORY_CARE_PROVIDER_SITE_OTHER): Payer: Medicare Other | Admitting: Orthopaedic Surgery

## 2019-11-06 ENCOUNTER — Encounter: Payer: Self-pay | Admitting: Orthopaedic Surgery

## 2019-11-06 ENCOUNTER — Ambulatory Visit: Payer: Medicare Other

## 2019-11-06 VITALS — Temp 97.1°F | Ht 62.5 in | Wt 190.0 lb

## 2019-11-06 DIAGNOSIS — S82842D Displaced bimalleolar fracture of left lower leg, subsequent encounter for closed fracture with routine healing: Secondary | ICD-10-CM | POA: Diagnosis not present

## 2019-11-06 DIAGNOSIS — M25511 Pain in right shoulder: Secondary | ICD-10-CM | POA: Diagnosis not present

## 2019-11-06 DIAGNOSIS — G8929 Other chronic pain: Secondary | ICD-10-CM

## 2019-11-06 NOTE — Progress Notes (Signed)
Patient FU:[id number] C Radloff, female DOB:21-Dec-1950, 69 y.o. [id number]  Chief Complaint  Patient presents with  . Shoulder Pain  . Fracture    HPI  [name] is a 69 y.o. female who I have been seeing for fracture of the ankle on the left.  She has developed pain in the right shoulder.  She is post rotator cuff surgery in the past.  The right shoulder is painful and she has great difficulty in trying to get it overhead.  She has no trauma, no redness, no swelling, no numbness.  Nothing seems to help.  She is using her CAM walker on the left ankle and has no new injury.   Body mass index is 34.2 kg/m.  ROS  Review of Systems  Constitutional: Positive for activity change.  Musculoskeletal: Positive for arthralgias, gait problem and joint swelling.  Psychiatric/Behavioral: The patient is nervous/anxious.   All other systems reviewed and are negative.   All other systems reviewed and are negative.  The following is a summary of the past history medically, past history surgically, known current medicines, social history and family history.  This information is gathered electronically by the computer from prior information and documentation.  I review this each visit and have found including this information at this point in the chart is beneficial and informative.    Past Medical History:  Diagnosis Date  . Anxiety   . Arthritis   . Coronary artery disease    LAD Cypher stent with PCI of the diagonal in 2004  . Depression   . Dyslipidemia   . Essential hypertension   . Fibromyalgia   . History of migraine   . Pre-diabetes     Past Surgical History:  Procedure Laterality Date  . ABDOMINAL HYSTERECTOMY     partial first, then full  . BACK SURGERY     01-30-2017 [facility]   . CARDIAC CATHETERIZATION    . CARPAL TUNNEL RELEASE Right   . CHOLECYSTECTOMY    . COLONOSCOPY    . CORONARY ANGIOPLASTY  2004   stent placed x1   . REPLACEMENT TOTAL KNEE Right   . SHOULDER  ARTHROSCOPY Right   . TOTAL HIP ARTHROPLASTY Left   . TOTAL HIP ARTHROPLASTY Right 06/01/2017   Procedure: RIGHT TOTAL HIP ARTHROPLASTY ANTERIOR APPROACH;  Surgeon: Mcarthur Rossetti, MD;  Location: [facility];  Service: Orthopedics;  Laterality: Right;  Needs RNFA  . TUBAL LIGATION      Family History  Problem Relation Age of Onset  . Heart attack Father 43  . Lung cancer Brother     Social History Social History   Tobacco Use  . Smoking status: Former Smoker    Packs/day: 0.75    Years: 45.00    Pack years: 33.75    Types: Cigarettes    Quit date: 01/30/2017    Years since quitting: 2.7  . Smokeless tobacco: Never Used  Substance Use Topics  . Alcohol use: Yes    Comment: occasional glass of wine  . Drug use: No    Allergies  Allergen Reactions  . Iodine Swelling    Red spots on skin after cath, swollen lips and eyes  . Adhesive [Tape] Dermatitis    Plastic tape causes skin irritation   . Niacin Rash    MAY BE PREDICTABLE RESPONSE TO NIACIN  . Penicillins Rash    Current Outpatient Medications  Medication Sig Dispense Refill  . acetaminophen (TYLENOL) 500 MG tablet Take 1,000 mg by  mouth every 6 (six) hours as needed for mild pain or moderate pain.    Marland Kitchen ALPRAZolam (XANAX) 0.5 MG tablet Take 0.5 mg by mouth 3 (three) times daily as needed for anxiety.    Marland Kitchen amLODipine (NORVASC) 10 MG tablet Take 10 mg by mouth daily.    Marland Kitchen aspirin EC 81 MG tablet Take 81 mg by mouth daily.    Marland Kitchen atorvastatin (LIPITOR) 10 MG tablet Take 10 mg by mouth at bedtime.    . cholecalciferol (VITAMIN D) 1000 units tablet Take 1,000 Units by mouth daily.    . clobetasol (TEMOVATE) 0.05 % external solution Apply 1 application topically 2 (two) times daily. scalp    . cyclobenzaprine (FLEXERIL) 10 MG tablet Take 10 mg by mouth 3 (three) times daily as needed for muscle spasms.    . furosemide (LASIX) 40 MG tablet Take 40 mg by mouth daily.    Marland Kitchen gabapentin (NEURONTIN) 400 MG capsule Take 800 mg  by mouth 2 (two) times daily.    Marland Kitchen lisinopril-hydrochlorothiazide (PRINZIDE,ZESTORETIC) 10-12.5 MG tablet Take 1 tablet by mouth daily.    . Multiple Vitamins-Minerals (MULTIVITAMIN WITH MINERALS) tablet Take 1 tablet by mouth daily. Women's One-A-Day    . nitroGLYCERIN (NITROSTAT) 0.4 MG SL tablet Place 1 tablet (0.4 mg total) under the tongue every 5 (five) minutes x 3 doses as needed for chest pain (if no relief after 3rd dose, proceed to the ED or call 911). 25 tablet 3  . oxybutynin (DITROPAN-XL) 5 MG 24 hr tablet Take 5 mg by mouth at bedtime.    . traMADol (ULTRAM) 50 MG tablet One tablet every six hours as needed for pain. 20 tablet 2  . triamcinolone (KENALOG) 0.1 % paste Use as directed 1 application in the mouth or throat 2 (two) times daily as needed (mouth ulcers).     No current facility-administered medications for this visit.     Physical Exam  Temperature (!) 97.1 F (36.2 C), height 5' 2.5" (1.588 m), weight 190 lb (86.2 kg).  Constitutional: overall normal hygiene, normal nutrition, well developed, normal grooming, normal body habitus. Assistive device:CAM Walkler left  Musculoskeletal: gait and station Limp left, muscle tone and strength are normal, no tremors or atrophy is present.  .  Neurological: coordination overall normal.  Deep tendon reflex/nerve stretch intact.  Sensation normal.  Cranial nerves II-XII intact.   Skin:   Normal overall no scars, lesions, ulcers or rashes. No psoriasis.  Psychiatric: Alert and oriented x 3.  Recent memory intact, remote memory unclear.  Normal mood and affect. Well groomed.  Good eye contact.  Cardiovascular: overall no swelling, no varicosities, no edema bilaterally, normal temperatures of the legs and arms, no clubbing, cyanosis and good capillary refill.  Lymphatic: palpation is normal.  Left ankle has slight swelling, good ROM.  Examination of right Upper Extremity is done.  Inspection:   Overall:  Elbow non-tender  without crepitus or defects, forearm non-tender without crepitus or defects, wrist non-tender without crepitus or defects, hand non-tender.    Shoulder: with glenohumeral joint tenderness, without effusion.   Upper arm:  without swelling and tenderness   Range of motion:   Overall:  Full range of motion of the elbow, full range of motion of wrist and full range of motion in fingers.   Shoulder:  right  120 degrees forward flexion; 90 degrees abduction; 15 degrees internal rotation, 15 degrees external rotation, 5 degrees extension, 40 degrees adduction.   Stability:   Overall:  Shoulder, elbow and wrist stable   Strength and Tone:   Overall full shoulder muscles strength, full upper arm strength and normal upper arm bulk and tone. All other systems reviewed and are negative   The patient has been educated about the nature of the problem(s) and counseled on treatment options.  The patient appeared to understand what I have discussed and is in agreement with it.  Encounter Diagnoses  Name Primary?  . Bimalleolar fracture of left ankle, closed, with routine healing, subsequent encounter Yes  . Chronic right shoulder pain    She has had x-rays of the right shoulder done 10-22-2019.    I have independently reviewed and interpreted x-rays of this patient done at another site by another physician or qualified health professional.  PROCEDURE NOTE:  The patient request injection, verbal consent was obtained.  The right shoulder was prepped appropriately after time out was performed.   Sterile technique was observed and injection of 1 cc of Depo-Medrol 40 mg with several cc's of plain xylocaine. Anesthesia was provided by ethyl chloride and a 20-gauge needle was used to inject the shoulder area. A posterior approach was used.  The injection was tolerated well.  A band aid dressing was applied.  The patient was advised to apply ice later today and tomorrow to the injection sight as  needed.    PLAN Call if any problems.  Precautions discussed.  Continue current medications.   Return to clinic 3 weeks   X-rays of the left ankle on return.  Electronically Signed Sanjuana Kava, MD 4/15/20212:57 PM

## 2019-11-11 ENCOUNTER — Telehealth: Payer: Self-pay | Admitting: Cardiology

## 2019-11-11 NOTE — Telephone Encounter (Signed)
  Patient Consent for Virtual Visit         [name] has provided verbal consent on 11/11/2019 for a virtual visit (video or telephone).   CONSENT FOR VIRTUAL VISIT FOR:  [name]  By participating in this virtual visit I agree to the following:  I hereby voluntarily request, consent and authorize [facility] and its employed or contracted physicians, physician assistants, nurse practitioners or other licensed health care professionals (the Practitioner), to provide me with telemedicine health care services (the "Services") as deemed necessary by the treating Practitioner. I acknowledge and consent to receive the Services by the Practitioner via telemedicine. I understand that the telemedicine visit will involve communicating with the Practitioner through live audiovisual communication technology and the disclosure of certain medical information by electronic transmission. I acknowledge that I have been given the opportunity to request an in-person assessment or other available alternative prior to the telemedicine visit and am voluntarily participating in the telemedicine visit.  I understand that I have the right to withhold or withdraw my consent to the use of telemedicine in the course of my care at any time, without affecting my right to future care or treatment, and that the Practitioner or I may terminate the telemedicine visit at any time. I understand that I have the right to inspect all information obtained and/or recorded in the course of the telemedicine visit and may receive copies of available information for a reasonable fee.  I understand that some of the potential risks of receiving the Services via telemedicine include:  Marland Kitchen Delay or interruption in medical evaluation due to technological equipment failure or disruption; . Information transmitted may not be sufficient (e.g. poor resolution of images) to allow for appropriate medical decision making by the Practitioner;  and/or  . In rare instances, security protocols could fail, causing a breach of personal health information.  Furthermore, I acknowledge that it is my responsibility to provide information about my medical history, conditions and care that is complete and accurate to the best of my ability. I acknowledge that Practitioner's advice, recommendations, and/or decision may be based on factors not within their control, such as incomplete or inaccurate data provided by me or distortions of diagnostic images or specimens that may result from electronic transmissions. I understand that the practice of medicine is not an exact science and that Practitioner makes no warranties or guarantees regarding treatment outcomes. I acknowledge that a copy of this consent can be made available to me via my patient portal ([facility]), or I can request a printed copy by calling the office of [facility].    I understand that my insurance will be billed for this visit.   I have read or had this consent read to me. . I understand the contents of this consent, which adequately explains the benefits and risks of the Services being provided via telemedicine.  . I have been provided ample opportunity to ask questions regarding this consent and the Services and have had my questions answered to my satisfaction. . I give my informed consent for the services to be provided through the use of telemedicine in my medical care

## 2019-11-12 ENCOUNTER — Encounter: Payer: Self-pay | Admitting: Cardiology

## 2019-11-12 ENCOUNTER — Telehealth (INDEPENDENT_AMBULATORY_CARE_PROVIDER_SITE_OTHER): Payer: Medicare Other | Admitting: Cardiology

## 2019-11-12 VITALS — Ht 62.0 in | Wt 189.0 lb

## 2019-11-12 DIAGNOSIS — E782 Mixed hyperlipidemia: Secondary | ICD-10-CM | POA: Diagnosis not present

## 2019-11-12 DIAGNOSIS — I25119 Atherosclerotic heart disease of native coronary artery with unspecified angina pectoris: Secondary | ICD-10-CM

## 2019-11-12 NOTE — Progress Notes (Signed)
Virtual Visit via Telephone Note   This visit type was conducted due to national recommendations for restrictions regarding the COVID-19 Pandemic (e.g. social distancing) in an effort to limit this patient's exposure and mitigate transmission in our community.  Due to her co-morbid illnesses, this patient is at least at moderate risk for complications without adequate follow up.  This format is felt to be most appropriate for this patient at this time.  The patient did not have access to video technology/had technical difficulties with video requiring transitioning to audio format only (telephone).  All issues noted in this document were discussed and addressed.  No physical exam could be performed with this format.  Please refer to the patient's chart for her  consent to telehealth for [facility].   The patient was identified using 2 identifiers.  Date:  11/12/2019   ID:  [name], DOB 1951/03/25, MRN [id number]  Patient Location: Home Provider Location: Office  PCP:  Manon Hilding, MD  Cardiologist:  Rozann Lesches, MD Electrophysiologist:  None   Evaluation Performed:  Follow-Up Visit  Chief Complaint:  Cardiac follow-up  History of Present Illness:    [name] is a 69 y.o. female last assessed via telehealth encounter in March 2020.  We spoke by phone today.  She does not report any active angina at this time, no nitroglycerin use.  I reviewed her medications which are stable and outlined below.  Cardiac regimen includes aspirin, Norvasc, Lipitor, Prinzide, and as needed nitroglycerin.  She is following lipids with Dr. Quintin Alto and anticipates lab work with physical in the near future.  She states that her husband did have lung cancer surgery, recuperated from that but still lacks stamina.  She does not report any other major health changes, has had some trouble with foot pain including plantar fasciitis and also an injury.   Past Medical History:  Diagnosis Date  .  Anxiety   . Arthritis   . Coronary artery disease    LAD Cypher stent with PCI of the diagonal in 2004  . Depression   . Dyslipidemia   . Essential hypertension   . Fibromyalgia   . History of migraine   . Pre-diabetes    Past Surgical History:  Procedure Laterality Date  . ABDOMINAL HYSTERECTOMY     partial first, then full  . BACK SURGERY     01-30-2017 [facility]   . CARDIAC CATHETERIZATION    . CARPAL TUNNEL RELEASE Right   . CHOLECYSTECTOMY    . COLONOSCOPY    . CORONARY ANGIOPLASTY  2004  . REPLACEMENT TOTAL KNEE Right   . SHOULDER ARTHROSCOPY Right   . TOTAL HIP ARTHROPLASTY Left   . TOTAL HIP ARTHROPLASTY Right 06/01/2017   Procedure: RIGHT TOTAL HIP ARTHROPLASTY ANTERIOR APPROACH;  Surgeon: Mcarthur Rossetti, MD;  Location: [facility];  Service: Orthopedics;  Laterality: Right;  Needs RNFA  . TUBAL LIGATION       Current Meds  Medication Sig  . acetaminophen (TYLENOL) 500 MG tablet Take 1,000 mg by mouth every 6 (six) hours as needed for mild pain or moderate pain.  Marland Kitchen ALPRAZolam (XANAX) 0.5 MG tablet Take 0.5 mg by mouth 3 (three) times daily as needed for anxiety.  Marland Kitchen amLODipine (NORVASC) 10 MG tablet Take 10 mg by mouth daily.  Marland Kitchen aspirin EC 81 MG tablet Take 81 mg by mouth daily.  Marland Kitchen atorvastatin (LIPITOR) 10 MG tablet Take 10 mg by mouth at bedtime.  . cholecalciferol (  VITAMIN D) 1000 units tablet Take 1,000 Units by mouth daily.  . clobetasol (TEMOVATE) 0.05 % external solution Apply 1 application topically 2 (two) times daily. scalp  . cyclobenzaprine (FLEXERIL) 10 MG tablet Take 10 mg by mouth 3 (three) times daily as needed for muscle spasms.  . furosemide (LASIX) 40 MG tablet Take 40 mg by mouth daily.  Marland Kitchen gabapentin (NEURONTIN) 400 MG capsule Take 800 mg by mouth 2 (two) times daily.  Marland Kitchen lisinopril-hydrochlorothiazide (PRINZIDE,ZESTORETIC) 10-12.5 MG tablet Take 1 tablet by mouth daily.  . Multiple Vitamins-Minerals (MULTIVITAMIN WITH MINERALS) tablet Take 1 tablet  by mouth daily. Women's One-A-Day  . nitroGLYCERIN (NITROSTAT) 0.4 MG SL tablet Place 1 tablet (0.4 mg total) under the tongue every 5 (five) minutes x 3 doses as needed for chest pain (if no relief after 3rd dose, proceed to the ED or call 911).  Marland Kitchen oxybutynin (DITROPAN-XL) 5 MG 24 hr tablet Take 5 mg by mouth at bedtime.  . traMADol (ULTRAM) 50 MG tablet One tablet every six hours as needed for pain.  Marland Kitchen triamcinolone (KENALOG) 0.1 % paste Use as directed 1 application in the mouth or throat 2 (two) times daily as needed (mouth ulcers).     Allergies:   Iodine, Adhesive [tape], Niacin, and Penicillins   ROS:   Foot pain.  Prior CV studies:   The following studies were reviewed today:  Echocardiogram 04/27/2015: Normal LV wall thickness with LVEF 60 to 65%, normal right ventricular contraction, mildly sclerotic aortic valve without stenosis, trace mitral regurgitation and tricuspid regurgitation, no pericardial effusion.  Lexiscan Myoview 01/12/2017:  There was no ST segment deviation noted during stress.  Findings consistent with prior antoerlateral myocardial infarction with mild peri-infarct ischemia.  This is a low risk study.  The left ventricular ejection fraction is normal (55-65%).  Labs/Other Tests and Data Reviewed:    EKG:  An ECG dated 05/25/2017 was personally reviewed today and demonstrated:  Sinus bradycardia.  Recent Labs:  November 2018: Potassium 3.9, BUN 11, creatinine 1.05, hemoglobin 10.7, platelets 274, hemoglobin A1c 5.8%  Wt Readings from Last 3 Encounters:  11/12/19 189 lb (85.7 kg)  11/06/19 190 lb (86.2 kg)  10/23/19 190 lb (86.2 kg)     Objective:    Vital Signs:  Ht 5\' 2"  (1.575 m)   Wt 189 lb (85.7 kg)   BMI 34.57 kg/m    Unable to obtain vital signs today. Patient spoke in full sentences, not short of breath. No audible wheezing or coughing.  ASSESSMENT & PLAN:    1.  CAD status post DES to the LAD and angioplasty of the diagonal in  2004.  She is doing well without active angina or nitroglycerin use.  We will continue observation at this point.  Continue aspirin, Lipitor, Norvasc, Prinzide, and as needed nitroglycerin.  2.  Mixed hyperlipidemia on Lipitor.  She is due for follow-up lab work with Dr. Quintin Alto.  Goal LDL should be 70 or less.   Time:   Today, I have spent 10 minutes with the patient with telehealth technology discussing the above problems.     Medication Adjustments/Labs and Tests Ordered: Current medicines are reviewed at length with the patient today.  Concerns regarding medicines are outlined above.   Tests Ordered: No orders of the defined types were placed in this encounter.   Medication Changes: No orders of the defined types were placed in this encounter.   Follow Up:  In Person 1 year in the [place] office.  Signed, Rozann Lesches, MD  11/12/2019 10:03 AM    [place]

## 2019-11-12 NOTE — Patient Instructions (Signed)

## 2019-11-25 ENCOUNTER — Encounter: Payer: Self-pay | Admitting: *Deleted

## 2019-12-02 ENCOUNTER — Other Ambulatory Visit: Payer: Self-pay

## 2019-12-02 ENCOUNTER — Ambulatory Visit: Payer: Medicare Other

## 2019-12-02 ENCOUNTER — Ambulatory Visit (INDEPENDENT_AMBULATORY_CARE_PROVIDER_SITE_OTHER): Payer: Medicare Other | Admitting: Orthopaedic Surgery

## 2019-12-02 ENCOUNTER — Encounter: Payer: Self-pay | Admitting: Orthopaedic Surgery

## 2019-12-02 DIAGNOSIS — S82842D Displaced bimalleolar fracture of left lower leg, subsequent encounter for closed fracture with routine healing: Secondary | ICD-10-CM | POA: Diagnosis not present

## 2019-12-02 NOTE — Progress Notes (Signed)
I am much better  She has been using the CAM walker on the left.  She has no pain, full ROM.  NV intact.  She has heel pain and Dr. Irving Shows will take care of that.  X-rays were done, reported separately of the left ankle.  Encounter Diagnosis  Name Primary?  . Bimalleolar fracture of left ankle, closed, with routine healing, subsequent encounter Yes   Return in one month.  Stop the CAM walker.  Call if any problem.  Precautions discussed.   Electronically Signed Sanjuana Kava, MD 5/11/20211:54 PM

## 2019-12-04 DIAGNOSIS — M79672 Pain in left foot: Secondary | ICD-10-CM | POA: Diagnosis not present

## 2019-12-04 DIAGNOSIS — M722 Plantar fascial fibromatosis: Secondary | ICD-10-CM | POA: Diagnosis not present

## 2019-12-12 DIAGNOSIS — E559 Vitamin D deficiency, unspecified: Secondary | ICD-10-CM | POA: Diagnosis not present

## 2019-12-12 DIAGNOSIS — R7303 Prediabetes: Secondary | ICD-10-CM | POA: Diagnosis not present

## 2019-12-12 DIAGNOSIS — E7800 Pure hypercholesterolemia, unspecified: Secondary | ICD-10-CM | POA: Diagnosis not present

## 2019-12-12 DIAGNOSIS — F17200 Nicotine dependence, unspecified, uncomplicated: Secondary | ICD-10-CM | POA: Diagnosis not present

## 2019-12-12 DIAGNOSIS — N183 Chronic kidney disease, stage 3 unspecified: Secondary | ICD-10-CM | POA: Diagnosis not present

## 2019-12-12 DIAGNOSIS — I10 Essential (primary) hypertension: Secondary | ICD-10-CM | POA: Diagnosis not present

## 2019-12-12 DIAGNOSIS — R739 Hyperglycemia, unspecified: Secondary | ICD-10-CM | POA: Diagnosis not present

## 2019-12-16 DIAGNOSIS — L639 Alopecia areata, unspecified: Secondary | ICD-10-CM | POA: Diagnosis not present

## 2019-12-16 DIAGNOSIS — Z23 Encounter for immunization: Secondary | ICD-10-CM | POA: Diagnosis not present

## 2019-12-16 DIAGNOSIS — I10 Essential (primary) hypertension: Secondary | ICD-10-CM | POA: Diagnosis not present

## 2019-12-16 DIAGNOSIS — R7303 Prediabetes: Secondary | ICD-10-CM | POA: Diagnosis not present

## 2019-12-16 DIAGNOSIS — S82892A Other fracture of left lower leg, initial encounter for closed fracture: Secondary | ICD-10-CM | POA: Diagnosis not present

## 2019-12-16 DIAGNOSIS — Z6837 Body mass index (BMI) 37.0-37.9, adult: Secondary | ICD-10-CM | POA: Diagnosis not present

## 2019-12-16 DIAGNOSIS — E7800 Pure hypercholesterolemia, unspecified: Secondary | ICD-10-CM | POA: Diagnosis not present

## 2019-12-30 ENCOUNTER — Ambulatory Visit (INDEPENDENT_AMBULATORY_CARE_PROVIDER_SITE_OTHER): Payer: Medicare Other | Admitting: Orthopaedic Surgery

## 2019-12-30 ENCOUNTER — Encounter: Payer: Self-pay | Admitting: Orthopaedic Surgery

## 2019-12-30 ENCOUNTER — Other Ambulatory Visit: Payer: Self-pay

## 2019-12-30 VITALS — BP 130/111 | HR 96 | Ht 62.0 in | Wt 195.4 lb

## 2019-12-30 DIAGNOSIS — S82842D Displaced bimalleolar fracture of left lower leg, subsequent encounter for closed fracture with routine healing: Secondary | ICD-10-CM

## 2019-12-30 NOTE — Progress Notes (Signed)
My ankle is much better   She is walking well with no problem. ROM of the left ankle is full.  NV intact.  Encounter Diagnosis  Name Primary?  . Bimalleolar fracture of left ankle, closed, with routine healing, subsequent encounter Yes   Discharge.  Call if any problem.  Precautions discussed.   Electronically Signed Sanjuana Kava, MD 6/8/20212:19 PM

## 2020-01-01 DIAGNOSIS — M722 Plantar fascial fibromatosis: Secondary | ICD-10-CM | POA: Diagnosis not present

## 2020-01-01 DIAGNOSIS — M79672 Pain in left foot: Secondary | ICD-10-CM | POA: Diagnosis not present

## 2020-04-09 DIAGNOSIS — E559 Vitamin D deficiency, unspecified: Secondary | ICD-10-CM | POA: Diagnosis not present

## 2020-04-09 DIAGNOSIS — F09 Unspecified mental disorder due to known physiological condition: Secondary | ICD-10-CM | POA: Diagnosis not present

## 2020-04-09 DIAGNOSIS — Z1321 Encounter for screening for nutritional disorder: Secondary | ICD-10-CM | POA: Diagnosis not present

## 2020-04-09 DIAGNOSIS — N183 Chronic kidney disease, stage 3 unspecified: Secondary | ICD-10-CM | POA: Diagnosis not present

## 2020-04-09 DIAGNOSIS — R739 Hyperglycemia, unspecified: Secondary | ICD-10-CM | POA: Diagnosis not present

## 2020-04-09 DIAGNOSIS — I10 Essential (primary) hypertension: Secondary | ICD-10-CM | POA: Diagnosis not present

## 2020-04-14 DIAGNOSIS — F419 Anxiety disorder, unspecified: Secondary | ICD-10-CM | POA: Diagnosis not present

## 2020-04-14 DIAGNOSIS — Z23 Encounter for immunization: Secondary | ICD-10-CM | POA: Diagnosis not present

## 2020-04-14 DIAGNOSIS — R7303 Prediabetes: Secondary | ICD-10-CM | POA: Diagnosis not present

## 2020-04-14 DIAGNOSIS — Z6837 Body mass index (BMI) 37.0-37.9, adult: Secondary | ICD-10-CM | POA: Diagnosis not present

## 2020-04-14 DIAGNOSIS — E7800 Pure hypercholesterolemia, unspecified: Secondary | ICD-10-CM | POA: Diagnosis not present

## 2020-04-14 DIAGNOSIS — E559 Vitamin D deficiency, unspecified: Secondary | ICD-10-CM | POA: Diagnosis not present

## 2020-04-14 DIAGNOSIS — M797 Fibromyalgia: Secondary | ICD-10-CM | POA: Diagnosis not present

## 2020-04-14 DIAGNOSIS — L639 Alopecia areata, unspecified: Secondary | ICD-10-CM | POA: Diagnosis not present

## 2020-04-14 DIAGNOSIS — F09 Unspecified mental disorder due to known physiological condition: Secondary | ICD-10-CM | POA: Diagnosis not present

## 2020-04-14 DIAGNOSIS — N183 Chronic kidney disease, stage 3 unspecified: Secondary | ICD-10-CM | POA: Diagnosis not present

## 2020-04-14 DIAGNOSIS — S82892A Other fracture of left lower leg, initial encounter for closed fracture: Secondary | ICD-10-CM | POA: Diagnosis not present

## 2020-04-14 DIAGNOSIS — I10 Essential (primary) hypertension: Secondary | ICD-10-CM | POA: Diagnosis not present

## 2020-05-07 DIAGNOSIS — G44319 Acute post-traumatic headache, not intractable: Secondary | ICD-10-CM | POA: Diagnosis not present

## 2020-05-07 DIAGNOSIS — Z6836 Body mass index (BMI) 36.0-36.9, adult: Secondary | ICD-10-CM | POA: Diagnosis not present

## 2020-05-10 ENCOUNTER — Other Ambulatory Visit: Payer: Self-pay | Admitting: Family Medicine

## 2020-05-10 DIAGNOSIS — R519 Headache, unspecified: Secondary | ICD-10-CM

## 2020-05-10 DIAGNOSIS — R42 Dizziness and giddiness: Secondary | ICD-10-CM

## 2020-05-14 ENCOUNTER — Encounter (HOSPITAL_COMMUNITY): Payer: Self-pay

## 2020-05-14 ENCOUNTER — Ambulatory Visit (HOSPITAL_COMMUNITY): Admission: RE | Admit: 2020-05-14 | Payer: Medicare Other | Source: Ambulatory Visit

## 2020-05-20 ENCOUNTER — Other Ambulatory Visit: Payer: Self-pay

## 2020-05-20 ENCOUNTER — Ambulatory Visit (HOSPITAL_COMMUNITY)
Admission: RE | Admit: 2020-05-20 | Discharge: 2020-05-20 | Disposition: A | Discharge status: Home / Self Care | Payer: Medicare Other | Source: Ambulatory Visit | Attending: Family Medicine | Admitting: Family Medicine

## 2020-05-20 DIAGNOSIS — R42 Dizziness and giddiness: Secondary | ICD-10-CM | POA: Diagnosis not present

## 2020-05-20 DIAGNOSIS — R519 Headache, unspecified: Secondary | ICD-10-CM | POA: Insufficient documentation

## 2020-05-20 DIAGNOSIS — G319 Degenerative disease of nervous system, unspecified: Secondary | ICD-10-CM | POA: Diagnosis not present

## 2020-05-20 DIAGNOSIS — S0990XA Unspecified injury of head, initial encounter: Secondary | ICD-10-CM | POA: Diagnosis not present

## 2020-07-20 DIAGNOSIS — J029 Acute pharyngitis, unspecified: Secondary | ICD-10-CM | POA: Diagnosis not present

## 2020-07-20 DIAGNOSIS — J329 Chronic sinusitis, unspecified: Secondary | ICD-10-CM | POA: Diagnosis not present

## 2020-07-20 DIAGNOSIS — J40 Bronchitis, not specified as acute or chronic: Secondary | ICD-10-CM | POA: Diagnosis not present

## 2020-07-20 DIAGNOSIS — J111 Influenza due to unidentified influenza virus with other respiratory manifestations: Secondary | ICD-10-CM | POA: Diagnosis not present

## 2020-07-20 DIAGNOSIS — Z20828 Contact with and (suspected) exposure to other viral communicable diseases: Secondary | ICD-10-CM | POA: Diagnosis not present

## 2020-08-06 DIAGNOSIS — Z1159 Encounter for screening for other viral diseases: Secondary | ICD-10-CM | POA: Diagnosis not present

## 2020-08-06 DIAGNOSIS — R7303 Prediabetes: Secondary | ICD-10-CM | POA: Diagnosis not present

## 2020-08-06 DIAGNOSIS — N183 Chronic kidney disease, stage 3 unspecified: Secondary | ICD-10-CM | POA: Diagnosis not present

## 2020-08-06 DIAGNOSIS — E7800 Pure hypercholesterolemia, unspecified: Secondary | ICD-10-CM | POA: Diagnosis not present

## 2020-08-06 DIAGNOSIS — Z20822 Contact with and (suspected) exposure to covid-19: Secondary | ICD-10-CM | POA: Diagnosis not present

## 2020-08-06 DIAGNOSIS — E7801 Familial hypercholesterolemia: Secondary | ICD-10-CM | POA: Diagnosis not present

## 2020-08-06 DIAGNOSIS — E559 Vitamin D deficiency, unspecified: Secondary | ICD-10-CM | POA: Diagnosis not present

## 2020-08-06 DIAGNOSIS — Z20828 Contact with and (suspected) exposure to other viral communicable diseases: Secondary | ICD-10-CM | POA: Diagnosis not present

## 2020-08-11 DIAGNOSIS — R7303 Prediabetes: Secondary | ICD-10-CM | POA: Diagnosis not present

## 2020-08-11 DIAGNOSIS — Z0001 Encounter for general adult medical examination with abnormal findings: Secondary | ICD-10-CM | POA: Diagnosis not present

## 2020-08-11 DIAGNOSIS — L639 Alopecia areata, unspecified: Secondary | ICD-10-CM | POA: Diagnosis not present

## 2020-08-11 DIAGNOSIS — Z6836 Body mass index (BMI) 36.0-36.9, adult: Secondary | ICD-10-CM | POA: Diagnosis not present

## 2020-08-11 DIAGNOSIS — S82892A Other fracture of left lower leg, initial encounter for closed fracture: Secondary | ICD-10-CM | POA: Diagnosis not present

## 2020-08-11 DIAGNOSIS — I10 Essential (primary) hypertension: Secondary | ICD-10-CM | POA: Diagnosis not present

## 2020-08-11 DIAGNOSIS — E7800 Pure hypercholesterolemia, unspecified: Secondary | ICD-10-CM | POA: Diagnosis not present

## 2020-12-06 DIAGNOSIS — R7303 Prediabetes: Secondary | ICD-10-CM | POA: Diagnosis not present

## 2020-12-06 DIAGNOSIS — E559 Vitamin D deficiency, unspecified: Secondary | ICD-10-CM | POA: Diagnosis not present

## 2020-12-06 DIAGNOSIS — I10 Essential (primary) hypertension: Secondary | ICD-10-CM | POA: Diagnosis not present

## 2020-12-06 DIAGNOSIS — N183 Chronic kidney disease, stage 3 unspecified: Secondary | ICD-10-CM | POA: Diagnosis not present

## 2020-12-08 DIAGNOSIS — E7801 Familial hypercholesterolemia: Secondary | ICD-10-CM | POA: Diagnosis not present

## 2020-12-08 DIAGNOSIS — R7303 Prediabetes: Secondary | ICD-10-CM | POA: Diagnosis not present

## 2020-12-08 DIAGNOSIS — F09 Unspecified mental disorder due to known physiological condition: Secondary | ICD-10-CM | POA: Diagnosis not present

## 2020-12-08 DIAGNOSIS — L639 Alopecia areata, unspecified: Secondary | ICD-10-CM | POA: Diagnosis not present

## 2020-12-08 DIAGNOSIS — S82892A Other fracture of left lower leg, initial encounter for closed fracture: Secondary | ICD-10-CM | POA: Diagnosis not present

## 2020-12-08 DIAGNOSIS — E559 Vitamin D deficiency, unspecified: Secondary | ICD-10-CM | POA: Diagnosis not present

## 2020-12-08 DIAGNOSIS — I10 Essential (primary) hypertension: Secondary | ICD-10-CM | POA: Diagnosis not present

## 2021-02-24 DIAGNOSIS — M810 Age-related osteoporosis without current pathological fracture: Secondary | ICD-10-CM | POA: Diagnosis not present

## 2021-02-24 DIAGNOSIS — M85832 Other specified disorders of bone density and structure, left forearm: Secondary | ICD-10-CM | POA: Diagnosis not present

## 2021-04-05 DIAGNOSIS — E559 Vitamin D deficiency, unspecified: Secondary | ICD-10-CM | POA: Diagnosis not present

## 2021-04-05 DIAGNOSIS — R7303 Prediabetes: Secondary | ICD-10-CM | POA: Diagnosis not present

## 2021-04-05 DIAGNOSIS — I10 Essential (primary) hypertension: Secondary | ICD-10-CM | POA: Diagnosis not present

## 2021-04-05 DIAGNOSIS — R739 Hyperglycemia, unspecified: Secondary | ICD-10-CM | POA: Diagnosis not present

## 2021-04-05 DIAGNOSIS — N183 Chronic kidney disease, stage 3 unspecified: Secondary | ICD-10-CM | POA: Diagnosis not present

## 2021-04-07 DIAGNOSIS — G5600 Carpal tunnel syndrome, unspecified upper limb: Secondary | ICD-10-CM | POA: Diagnosis not present

## 2021-04-07 DIAGNOSIS — F09 Unspecified mental disorder due to known physiological condition: Secondary | ICD-10-CM | POA: Diagnosis not present

## 2021-04-07 DIAGNOSIS — R7303 Prediabetes: Secondary | ICD-10-CM | POA: Diagnosis not present

## 2021-04-07 DIAGNOSIS — L639 Alopecia areata, unspecified: Secondary | ICD-10-CM | POA: Diagnosis not present

## 2021-04-07 DIAGNOSIS — I10 Essential (primary) hypertension: Secondary | ICD-10-CM | POA: Diagnosis not present

## 2021-04-07 DIAGNOSIS — E559 Vitamin D deficiency, unspecified: Secondary | ICD-10-CM | POA: Diagnosis not present

## 2021-04-07 DIAGNOSIS — Z23 Encounter for immunization: Secondary | ICD-10-CM | POA: Diagnosis not present

## 2021-06-28 DIAGNOSIS — F32A Depression, unspecified: Secondary | ICD-10-CM | POA: Diagnosis not present

## 2021-06-28 DIAGNOSIS — Z6833 Body mass index (BMI) 33.0-33.9, adult: Secondary | ICD-10-CM | POA: Diagnosis not present

## 2021-06-28 DIAGNOSIS — M797 Fibromyalgia: Secondary | ICD-10-CM | POA: Diagnosis not present

## 2021-06-28 DIAGNOSIS — F419 Anxiety disorder, unspecified: Secondary | ICD-10-CM | POA: Diagnosis not present

## 2021-06-28 DIAGNOSIS — S20219A Contusion of unspecified front wall of thorax, initial encounter: Secondary | ICD-10-CM | POA: Diagnosis not present

## 2021-08-02 DIAGNOSIS — Z6832 Body mass index (BMI) 32.0-32.9, adult: Secondary | ICD-10-CM | POA: Diagnosis not present

## 2021-08-02 DIAGNOSIS — Z20828 Contact with and (suspected) exposure to other viral communicable diseases: Secondary | ICD-10-CM | POA: Diagnosis not present

## 2021-08-02 DIAGNOSIS — J019 Acute sinusitis, unspecified: Secondary | ICD-10-CM | POA: Diagnosis not present

## 2021-08-02 DIAGNOSIS — R059 Cough, unspecified: Secondary | ICD-10-CM | POA: Diagnosis not present

## 2021-09-26 DIAGNOSIS — E559 Vitamin D deficiency, unspecified: Secondary | ICD-10-CM | POA: Diagnosis not present

## 2021-09-26 DIAGNOSIS — E7800 Pure hypercholesterolemia, unspecified: Secondary | ICD-10-CM | POA: Diagnosis not present

## 2021-09-26 DIAGNOSIS — R739 Hyperglycemia, unspecified: Secondary | ICD-10-CM | POA: Diagnosis not present

## 2021-09-26 DIAGNOSIS — N183 Chronic kidney disease, stage 3 unspecified: Secondary | ICD-10-CM | POA: Diagnosis not present

## 2021-09-26 DIAGNOSIS — I10 Essential (primary) hypertension: Secondary | ICD-10-CM | POA: Diagnosis not present

## 2021-09-26 DIAGNOSIS — M797 Fibromyalgia: Secondary | ICD-10-CM | POA: Diagnosis not present

## 2021-09-26 DIAGNOSIS — Z131 Encounter for screening for diabetes mellitus: Secondary | ICD-10-CM | POA: Diagnosis not present

## 2021-09-26 DIAGNOSIS — E7801 Familial hypercholesterolemia: Secondary | ICD-10-CM | POA: Diagnosis not present

## 2021-10-05 ENCOUNTER — Telehealth: Payer: Self-pay | Admitting: Orthopedic Surgery

## 2021-10-05 DIAGNOSIS — F09 Unspecified mental disorder due to known physiological condition: Secondary | ICD-10-CM | POA: Diagnosis not present

## 2021-10-05 DIAGNOSIS — I10 Essential (primary) hypertension: Secondary | ICD-10-CM | POA: Diagnosis not present

## 2021-10-05 DIAGNOSIS — R7303 Prediabetes: Secondary | ICD-10-CM | POA: Diagnosis not present

## 2021-10-05 DIAGNOSIS — Z23 Encounter for immunization: Secondary | ICD-10-CM | POA: Diagnosis not present

## 2021-10-05 DIAGNOSIS — Z1331 Encounter for screening for depression: Secondary | ICD-10-CM | POA: Diagnosis not present

## 2021-10-05 DIAGNOSIS — Z1389 Encounter for screening for other disorder: Secondary | ICD-10-CM | POA: Diagnosis not present

## 2021-10-05 DIAGNOSIS — L639 Alopecia areata, unspecified: Secondary | ICD-10-CM | POA: Diagnosis not present

## 2021-10-05 DIAGNOSIS — E559 Vitamin D deficiency, unspecified: Secondary | ICD-10-CM | POA: Diagnosis not present

## 2021-10-05 NOTE — Telephone Encounter (Signed)
Spoke with patient - said she is scheduled with her primary care provider and will have a referral sent for new problem of carpal tunnel to schedule with Dr Aline Brochure; aware we are happy to schedule. ?

## 2021-11-14 DIAGNOSIS — Z20822 Contact with and (suspected) exposure to covid-19: Secondary | ICD-10-CM | POA: Diagnosis not present

## 2021-11-23 DIAGNOSIS — Z20822 Contact with and (suspected) exposure to covid-19: Secondary | ICD-10-CM | POA: Diagnosis not present

## 2022-02-08 ENCOUNTER — Telehealth: Payer: Self-pay | Admitting: Physical Medicine and Rehabilitation

## 2022-02-08 ENCOUNTER — Ambulatory Visit (INDEPENDENT_AMBULATORY_CARE_PROVIDER_SITE_OTHER): Payer: Medicare Other | Admitting: Orthopaedic Surgery

## 2022-02-08 ENCOUNTER — Encounter: Payer: Self-pay | Admitting: Orthopaedic Surgery

## 2022-02-08 VITALS — BP 106/66 | HR 68 | Ht 62.0 in | Wt 178.4 lb

## 2022-02-08 DIAGNOSIS — G5602 Carpal tunnel syndrome, left upper limb: Secondary | ICD-10-CM

## 2022-02-08 NOTE — Patient Instructions (Addendum)
[facility] [place] PHONE: [phone]

## 2022-02-08 NOTE — Progress Notes (Signed)
My hand goes numb  She has pain and numbness in the median nerve distribution on the left hand.  She has seen Dr. Delsa Bern and he asked that she come here.  She is not getting any better.  Nothing helps. She has tried a splint. She has had carpal tunnel release on the right years ago.  She has positive Tinel and Phalen sign on the left hand with decreased sensation of median nerve on the left.  Encounter Diagnosis  Name Primary?   Carpal tunnel syndrome, left upper limb Yes   I will get EMGs.  Return in two weeks.  Call if any problem.  Precautions discussed.  Electronically Signed Sanjuana Kava, MD 7/19/202310:50 AM

## 2022-02-08 NOTE — Telephone Encounter (Signed)
Patient called needing to schedule an appointment with Dr. Ernestina Patches. Patient said she was referred to Dr. Ernestina Patches.  [phone]

## 2022-02-22 ENCOUNTER — Ambulatory Visit: Payer: Medicare Other | Admitting: Orthopaedic Surgery

## 2022-03-07 ENCOUNTER — Ambulatory Visit (INDEPENDENT_AMBULATORY_CARE_PROVIDER_SITE_OTHER): Payer: Medicare Other | Admitting: Physical Medicine and Rehabilitation

## 2022-03-07 VITALS — BP 142/58 | HR 67 | Ht 62.0 in | Wt 179.0 lb

## 2022-03-07 DIAGNOSIS — R202 Paresthesia of skin: Secondary | ICD-10-CM

## 2022-03-07 NOTE — Progress Notes (Unsigned)
Numeric Pain Rating Scale and Functional Assessment Average Pain 10   In the last MONTH (on 0-10 scale) has pain interfered with the following?  1. General activity like being  able to carry out your everyday physical activities such as walking, climbing stairs, carrying groceries, or moving a chair?  Rating({NUMBERS; 0-10:5044}) Left hand pain. Usually has numbness and tingling. When pain hits its a 10/10.  +Driver, -BT, -Dye Allergies.

## 2022-03-07 NOTE — Progress Notes (Signed)
   [name] - 71 y.o. female MRN [id number]  Date of birth: [birth year]  Office Visit Note: Visit Date: 03/07/2022 PCP: Manon Hilding, MD Referred by: Sanjuana Kava, MD  Subjective: Chief Complaint  Patient presents with  . Left Wrist - Pain   HPI:  [name] is a 71 y.o. female who comes in today at the request of Dr. Sanjuana Kava for electrodiagnostic study of the Left upper extremities.  Patient is Right hand dominant.   ROS Otherwise per HPI.  Assessment & Plan: Visit Diagnoses:    ICD-10-CM   1. Paresthesia of skin  R20.2 NCV with EMG (electromyography)      Plan: No additional findings.   Meds & Orders: No orders of the defined types were placed in this encounter.   Orders Placed This Encounter  Procedures  . NCV with EMG (electromyography)    Follow-up: Return for Sanjuana Kava, MD 03/09/2022.   Procedures: No procedures performed      Clinical History: No specialty comments available.     Objective:  VS:  HT:'5\' 2"'$  (157.5 cm)   WT:179 lb (81.2 kg)  BMI:32.73    BP:(!) 142/58  HR:67bpm  TEMP: ( )  RESP:  Physical Exam   Imaging: No results found.

## 2022-03-08 NOTE — Procedures (Signed)
EMG & NCV Findings: Evaluation of the left median motor nerve showed prolonged distal onset latency (5.9 ms), reduced amplitude (0.1 mV), and decreased conduction velocity (Elbow-Wrist, 24 m/s).  The left median (across palm) sensory nerve showed prolonged distal peak latency (Wrist, 5.0 ms), reduced amplitude (2.5 V), and prolonged distal peak latency (Palm, 5.1 ms).  All remaining nerves (as indicated in the following tables) were within normal limits.    Needle evaluation of the left abductor pollicis brevis muscle showed decreased insertional activity, widespread spontaneous activity, and diminished recruitment.  All remaining muscles (as indicated in the following table) showed no evidence of electrical instability.    Impression: The above electrodiagnostic study is ABNORMAL and reveals evidence of a severe left median nerve entrapment at the wrist (carpal tunnel syndrome) affecting sensory and motor components. **The lesion is characterized by sensory and motor demyelination with evidence of significant axonal injury.  Even with appropriate decompression there may be residual symptoms.   There is no significant electrodiagnostic evidence of any other focal nerve entrapment, brachial plexopathy or cervical radiculopathy.   Recommendations: 1.  Follow-up with referring physician. 2.  Continue current management of symptoms. 3.  Suggest surgical evaluation.  ___________________________ Laurence Spates FAAPMR Board Certified, American Board of Physical Medicine and Rehabilitation    Nerve Conduction Studies Anti Sensory Summary Table   Stim Site NR Peak (ms) Norm Peak (ms) P-T Amp (V) Norm P-T Amp Site1 Site2 Delta-P (ms) Dist (cm) Vel (m/s) Norm Vel (m/s)  Left Median Acr Palm Anti Sensory (2nd Digit)  31.5C  Wrist    *5.0 <3.6 *2.5 >10 Wrist Palm 0.1 0.0    Palm    *5.1 <2.0 4.3         Left Radial Anti Sensory (Base 1st Digit)  32C  Wrist    2.0 <3.1 28.2  Wrist Base 1st Digit 2.0  0.0    Left Ulnar Anti Sensory (5th Digit)  32.6C  Wrist    2.9 <3.7 25.5 >15.0 Wrist 5th Digit 2.9 14.0 48 >38   Motor Summary Table   Stim Site NR Onset (ms) Norm Onset (ms) O-P Amp (mV) Norm O-P Amp Site1 Site2 Delta-0 (ms) Dist (cm) Vel (m/s) Norm Vel (m/s)  Left Median Motor (Abd Poll Brev)  32.3C  Wrist    *5.9 <4.2 *0.1 >5 Elbow Wrist 8.7 21.0 *24 >50  Elbow    14.6  0.2         Left Ulnar Motor (Abd Dig Min)  32.6C  Wrist    2.9 <4.2 8.6 >3 B Elbow Wrist 3.0 20.0 67 >53  B Elbow    5.9  7.8  A Elbow B Elbow 1.3 10.0 77 >53  A Elbow    7.2  7.9          EMG   Side Muscle Nerve Root Ins Act Fibs Psw Amp Dur Poly Recrt Int Fraser Din Comment  Left Abd Poll Brev Median C8-T1 *Decr *4+ *4+ Nml Nml 0 *Reduced Nml  no MUAP  Left 1stDorInt Ulnar C8-T1 Nml Nml Nml Nml Nml 0 Nml Nml   Left PronatorTeres Median C6-7 Nml Nml Nml Nml Nml 0 Nml Nml   Left Biceps Musculocut C5-6 Nml Nml Nml Nml Nml 0 Nml Nml     Nerve Conduction Studies Anti Sensory Left/Right Comparison   Stim Site L Lat (ms) R Lat (ms) L-R Lat (ms) L Amp (V) R Amp (V) L-R Amp (%) Site1 Site2 L Vel (m/s) R Vel (m/s)  L-R Vel (m/s)  Median Acr Palm Anti Sensory (2nd Digit)  31.5C  Wrist *5.0   *2.5   Wrist Palm     Palm *5.1   4.3         Radial Anti Sensory (Base 1st Digit)  32C  Wrist 2.0   28.2   Wrist Base 1st Digit     Ulnar Anti Sensory (5th Digit)  32.6C  Wrist 2.9   25.5   Wrist 5th Digit 48     Motor Left/Right Comparison   Stim Site L Lat (ms) R Lat (ms) L-R Lat (ms) L Amp (mV) R Amp (mV) L-R Amp (%) Site1 Site2 L Vel (m/s) R Vel (m/s) L-R Vel (m/s)  Median Motor (Abd Poll Brev)  32.3C  Wrist *5.9   *0.1   Elbow Wrist *24    Elbow 14.6   0.2         Ulnar Motor (Abd Dig Min)  32.6C  Wrist 2.9   8.6   B Elbow Wrist 67    B Elbow 5.9   7.8   A Elbow B Elbow 77    A Elbow 7.2   7.9            Waveforms:

## 2022-03-09 ENCOUNTER — Ambulatory Visit: Payer: Medicare Other | Admitting: Orthopaedic Surgery

## 2022-03-21 ENCOUNTER — Ambulatory Visit (INDEPENDENT_AMBULATORY_CARE_PROVIDER_SITE_OTHER): Payer: Medicare Other | Admitting: Orthopaedic Surgery

## 2022-03-21 ENCOUNTER — Encounter: Payer: Self-pay | Admitting: Orthopaedic Surgery

## 2022-03-21 DIAGNOSIS — G5602 Carpal tunnel syndrome, left upper limb: Secondary | ICD-10-CM | POA: Diagnosis not present

## 2022-03-21 MED ORDER — HYDROCODONE-ACETAMINOPHEN 5-325 MG PO TABS: 1.0000 | ORAL_TABLET | ORAL | 0 refills | Status: AC | PRN

## 2022-03-21 NOTE — Progress Notes (Signed)
Hand pai

## 2022-03-21 NOTE — Patient Instructions (Signed)
Please schedule patient to see Dr.Harrison for surgery consultation for CTR surgery LT hand

## 2022-03-21 NOTE — Progress Notes (Signed)
My hand is hurting and goes numb all the time.  She had EMGs by Dr. Ernestina Patches showing severe carpal tunnel left.  I have read the report.  I have recommended carpal tunnel release as she is not improving at all.  She wants to see Dr. Aline Brochure.    She has positive Tinel and Phalens left, decreased sensation left median nerve, ROM full.  Encounter Diagnosis  Name Primary?   Carpal tunnel syndrome, left upper limb Yes   I have reviewed the [place] web site prior to prescribing narcotic medicine for this patient.  To see Dr. Aline Brochure.  Call if any problem.  Precautions discussed.  Electronically Signed Sanjuana Kava, MD 8/29/202310:41 AM

## 2022-03-29 ENCOUNTER — Ambulatory Visit (INDEPENDENT_AMBULATORY_CARE_PROVIDER_SITE_OTHER): Payer: Medicare Other | Admitting: Orthopedic Surgery

## 2022-03-29 ENCOUNTER — Encounter: Payer: Self-pay | Admitting: Orthopedic Surgery

## 2022-03-29 VITALS — Ht 62.0 in | Wt 179.0 lb

## 2022-03-29 DIAGNOSIS — Z01818 Encounter for other preprocedural examination: Secondary | ICD-10-CM

## 2022-03-29 DIAGNOSIS — G5602 Carpal tunnel syndrome, left upper limb: Secondary | ICD-10-CM

## 2022-03-29 NOTE — Patient Instructions (Signed)
Your surgery will be at [facility] by Dr Harrison  The hospital will contact you with a preoperative appointment to discuss Anesthesia.  Please arrive on time or 15 minutes early for the preoperative appointment, they have a very tight schedule if you are late or do not come in your surgery will be cancelled.  The phone number is [phone]. Please bring your medications with you for the appointment. They will tell you the arrival time and medication instructions when you have your preoperative evaluation. Do not wear nail polish the day of your surgery and if you take Phentermine you need to stop this medication ONE WEEK prior to your surgery. If you take Invokana, Farxiga, Jardiance, or Steglatro) - Hold 72 hours before the procedure.  If you take Ozempic,  Bydureon or Trulicity do not take for 8 days before your surgery. If you take Victoza, Rybelsis, Saxenda or Adlyxi stop 24 hours before the procedure.  Please arrive at the hospital 2 hours before procedure if scheduled at 9:30 or later in the day or at the time the nurse tells you at your preoperative visit.   If you have my chart do not use the time given in my chart use the time given to you by the nurse during your preoperative visit.   Your surgery  time may change. Please be available for phone calls the day of your surgery and the day before. The Short Stay department may need to discuss changes about your surgery time. Not reaching the you could lead to procedure delays and possible cancellation.  You must have a ride home and someone to stay with you for 24 to 48 hours. The person taking you home will receive and sign for the your discharge instructions.  Please be prepared to give your support person's name and telephone number to [facility] [employer]. Dr Harrison will need that name and phone number post procedure.   

## 2022-03-29 NOTE — Progress Notes (Signed)
Chief Complaint  Patient presents with   Carpal Tunnel    Left     HPI: 71 year old female complains of pain paresthesias of the thumb index long and part of the ring finger of the left hand.  She has had symptoms now for several months.  She complains of trouble picking things up.  She says it feels like Beazer in her hand.  Dr. Luna Glasgow has seen her and instituted some measures of nonoperative treatment she went for nerve conduction study which showed severe disease  Plan: Impression: The above electrodiagnostic study is ABNORMAL and reveals evidence of a severe left median nerve entrapment at the wrist (carpal tunnel syndrome) affecting sensory and motor components. **The lesion is characterized by sensory and motor demyelination with evidence of significant axonal injury.  Even with appropriate decompression there may be residual symptoms.     There is no significant electrodiagnostic evidence of any other focal nerve entrapment, brachial plexopathy or cervical radiculopathy.    Past Medical History:  Diagnosis Date   Anxiety    Arthritis    Coronary artery disease    LAD Cypher stent with PCI of the diagonal in 2004   Depression    Dyslipidemia    Essential hypertension    Fibromyalgia    History of migraine    Pre-diabetes     Ht '5\' 2"'$  (1.575 m)   Wt 179 lb (81.2 kg)   BMI 32.74 kg/m    General appearance: Well-developed well-nourished no gross deformities  Cardiovascular normal pulse and perfusion normal color without edema  Neurologically no sensation loss or deficits or pathologic reflexes  Psychological: Awake alert and oriented x3 mood and affect normal  Skin no lacerations or ulcerations no nodularity no palpable masses, no erythema or nodularity  Musculoskeletal: 71 year old female with pain and paresthesias of the left upper extremity she has tingling and numbness decree sensation in the thumb index long and ring finger but only part of it.  She also has  tenderness over the basal joint of the thumb  She has crepitance and grinding.    A/P  71 year old female with severe disease  I told her that she also has some basilar joint thumb arthritis/I also think that her improvement may be partial perhaps 85% improvement.  She may need further work-up regarding the basilar joint arthritis of the thumb

## 2022-03-29 NOTE — Addendum Note (Signed)
Addended byCandice Camp on: 03/29/2022 01:52 PM   Modules accepted: Orders

## 2022-03-29 NOTE — Patient Instructions (Signed)
MEKHIA BROGAN  03/29/2022     '@PREFPERIOPPHARMACY'$ @   Your procedure is scheduled on  04/04/2022.   Report to [facility] at  0700  A.M.   Call this number if you have problems the morning of surgery:  [phone]   Remember:  Do not eat or drink after midnight.      Take these medicines the morning of surgery with A SIP OF WATER               xanax(if needed), norvasc, flexeril(if needed), neurontin, ditropan, zoloft, ultram (if needed).     Do not wear jewelry, make-up or nail polish.  Do not wear lotions, powders, or perfumes, or deodorant.  Do not shave 48 hours prior to surgery.  Men may shave face and neck.  Do not bring valuables to the hospital.  [facility] is not responsible for any belongings or valuables.  Contacts, dentures or bridgework may not be worn into surgery.  Leave your suitcase in the car.  After surgery it may be brought to your room.  For patients admitted to the hospital, discharge time will be determined by your treatment team.  Patients discharged the day of surgery will not be allowed to drive home and must have someone with them for 24 hours.    Special instructions:   DO NOT smoke tobacco or vape for 24 hours before your procedure.  Please read over the following fact sheets that you were given. Coughing and Deep Breathing, Surgical Site Infection Prevention, Anesthesia Post-op Instructions, and Care and Recovery After Surgery      Open Carpal Tunnel Release, Care After This sheet gives you information about how to care for yourself after your procedure. Your health care provider may also give you more specific instructions. If you have problems or questions, contact your health care provider. What can I expect after the procedure? After the procedure, it is common to have: Pain. Swelling. Wrist stiffness. Bruising. Follow these instructions at home: Medicines Take over-the-counter and prescription medicines only as told  by your health care provider. Ask your health care provider if the medicine prescribed to you: Requires you to avoid driving or using machinery. Can cause constipation. You may need to take these actions to prevent or treat constipation: Drink enough fluid to keep your urine pale yellow. Take over-the-counter or prescription medicines. Eat foods that are high in fiber, such as beans, whole grains, and fresh fruits and vegetables. Limit foods that are high in fat and processed sugars, such as fried or sweet foods. Bathing Do not take baths, swim, or use a hot tub until your health care provider approves. Ask your health care provider if you may take showers. Keep your bandage (dressing) dry until your health care provider says it can be removed. Cover it with a watertight covering when you take a bath or a shower. If you have a splint or brace: Wear the splint or brace as told by your health care provider. You may need to wear it for 2-3 weeks. Remove it only as told by your health care provider. Loosen the splint or brace if your fingers tingle, become numb, or turn cold and blue. Keep the splint or brace clean. If the splint or brace is not waterproof: Do not let it get wet. Cover it with a watertight covering when you take a bath or a shower. Incision care  After the compression bandage has been removed,  follow instructions from your health care provider about how to take care of your incision. Make sure you: Wash your hands with soap and water for at least 20 seconds before and after you change your bandage (dressing). If soap and water are not available, use hand sanitizer. Change your dressing as told by your health care provider. Leave stitches (sutures), skin glue, or adhesive strips in place. These skin closures may need to stay in place for 2 weeks or longer. If adhesive strip edges start to loosen and curl up, you may trim the loose edges. Do not remove adhesive strips completely  unless your health care provider tells you to do that. Check your incision area every day for signs of infection. Check for: Redness. More swelling or pain. Fluid or blood. Warmth. Pus or a bad smell. Managing pain, stiffness, and swelling  If directed, put ice on the affected area. If you have a removable splint or brace, remove it as told by your health care provider. Put ice in a plastic bag. Place a towel between your skin and the bag. Leave the ice on for 20 minutes, 2-3 times a day. Do not fall asleep with ice pack on your skin. Remove the ice if your skin turns bright red. This is very important. If you cannot feel pain, heat, or cold, you have a greater risk of damage to the area. Move your fingers often to avoid stiffness and to lessen swelling. Raise (elevate) your wrist above the level of your heart while you are sitting or lying down. Activity Do not drive until your health care provider approves. Use your hand carefully. Do not do activities that cause pain. You should be able to do light activities with your hand. Do not lift with your affected hand until your health care provider approves. Avoid pulling and pushing with the injured arm. Return to your normal activities as told by your health care provider. Ask your health care provider what activities are safe for you. If physical therapy was prescribed, do exercises as told by your therapist. Physical therapy can help you heal faster and regain movement. General instructions Do not use any products that contain nicotine or tobacco, such as cigarettes and e-cigarettes. These can delay incision healing after surgery. If you need help quitting, ask your health care provider. Keep all follow-up visits. This is important. These include visits for physical therapy. Contact a health care provider if: You have redness around your incision. You have more swelling or pain. You have fluid or blood coming from your incision. Your  incision feels warm to the touch. You have pus or a bad smell coming from your incision. You have a fever or chills. You have pain that does not get better with medicine. Your carpal tunnel symptoms do not go away after 2 months. Your carpal tunnel symptoms go away and then come back. Get help right away if: You have pain or numbness that is getting worse. Your fingers or fingertips become very pale or bluish in color. You are not able to move your fingers. Summary It is common to have wrist stiffness and bruising after a carpal tunnel release. Icing and raising (elevating) your wrist may help to lessen swelling and pain. Call your health care provider if you have a fever or notice any signs of infection in your incision area. This information is not intended to replace advice given to you by your health care provider. Make sure you discuss any questions you have  with your health care provider. Document Revised: 11/13/2019 Document Reviewed: 11/13/2019 Elsevier Patient Education  [place] After This sheet gives you information about how to care for yourself after your procedure. Your health care provider may also give you more specific instructions. If you have problems or questions, contact your health care provider. What can I expect after the procedure? After the procedure, it is common to have: Tiredness. Forgetfulness about what happened after the procedure. Impaired judgment for important decisions. Nausea or vomiting. Some difficulty with balance. Follow these instructions at home: For the time period you were told by your health care provider:     Rest as needed. Do not participate in activities where you could fall or become injured. Do not drive or use machinery. Do not drink alcohol. Do not take sleeping pills or medicines that cause drowsiness. Do not make important decisions or sign legal documents. Do not take care of  children on your own. Eating and drinking Follow the diet that is recommended by your health care provider. Drink enough fluid to keep your urine pale yellow. If you vomit: Drink water, juice, or soup when you can drink without vomiting. Make sure you have little or no nausea before eating solid foods. General instructions Have a responsible adult stay with you for the time you are told. It is important to have someone help care for you until you are awake and alert. Take over-the-counter and prescription medicines only as told by your health care provider. If you have sleep apnea, surgery and certain medicines can increase your risk for breathing problems. Follow instructions from your health care provider about wearing your sleep device: Anytime you are sleeping, including during daytime naps. While taking prescription pain medicines, sleeping medicines, or medicines that make you drowsy. Avoid smoking. Keep all follow-up visits as told by your health care provider. This is important. Contact a health care provider if: You keep feeling nauseous or you keep vomiting. You feel light-headed. You are still sleepy or having trouble with balance after 24 hours. You develop a rash. You have a fever. You have redness or swelling around the IV site. Get help right away if: You have trouble breathing. You have new-onset confusion at home. Summary For several hours after your procedure, you may feel tired. You may also be forgetful and have poor judgment. Have a responsible adult stay with you for the time you are told. It is important to have someone help care for you until you are awake and alert. Rest as told. Do not drive or operate machinery. Do not drink alcohol or take sleeping pills. Get help right away if you have trouble breathing, or if you suddenly become confused. This information is not intended to replace advice given to you by your health care provider. Make sure you discuss any  questions you have with your health care provider. Document Revised: 06/14/2021 Document Reviewed: 06/12/2019 Elsevier Patient Education  [place]. How to Use Chlorhexidine Before Surgery Chlorhexidine gluconate (CHG) is a germ-killing (antiseptic) solution that is used to clean the skin. It can get rid of the bacteria that normally live on the skin and can keep them away for about 24 hours. To clean your skin with CHG, you may be given: A CHG solution to use in the shower or as part of a sponge bath. A prepackaged cloth that contains CHG. Cleaning your skin with CHG may help lower the risk for infection: While you  are staying in the intensive care unit of the hospital. If you have a vascular access, such as a central line, to provide short-term or long-term access to your veins. If you have a catheter to drain urine from your bladder. If you are on a ventilator. A ventilator is a machine that helps you breathe by moving air in and out of your lungs. After surgery. What are the risks? Risks of using CHG include: A skin reaction. Hearing loss, if CHG gets in your ears and you have a perforated eardrum. Eye injury, if CHG gets in your eyes and is not rinsed out. The CHG product catching fire. Make sure that you avoid smoking and flames after applying CHG to your skin. Do not use CHG: If you have a chlorhexidine allergy or have previously reacted to chlorhexidine. On babies younger than 43 months of age. How to use CHG solution Use CHG only as told by your health care provider, and follow the instructions on the label. Use the full amount of CHG as directed. Usually, this is one bottle. During a shower Follow these steps when using CHG solution during a shower (unless your health care provider gives you different instructions): Start the shower. Use your normal soap and shampoo to wash your face and hair. Turn off the shower or move out of the shower stream. Pour the CHG onto a  clean washcloth. Do not use any type of brush or rough-edged sponge. Starting at your neck, lather your body down to your toes. Make sure you follow these instructions: If you will be having surgery, pay special attention to the part of your body where you will be having surgery. Scrub this area for at least 1 minute. Do not use CHG on your head or face. If the solution gets into your ears or eyes, rinse them well with water. Avoid your genital area. Avoid any areas of skin that have broken skin, cuts, or scrapes. Scrub your back and under your arms. Make sure to wash skin folds. Let the lather sit on your skin for 1-2 minutes or as long as told by your health care provider. Thoroughly rinse your entire body in the shower. Make sure that all body creases and crevices are rinsed well. Dry off with a clean towel. Do not put any substances on your body afterward--such as powder, lotion, or perfume--unless you are told to do so by your health care provider. Only use lotions that are recommended by the manufacturer. Put on clean clothes or pajamas. If it is the night before your surgery, sleep in clean sheets.  During a sponge bath Follow these steps when using CHG solution during a sponge bath (unless your health care provider gives you different instructions): Use your normal soap and shampoo to wash your face and hair. Pour the CHG onto a clean washcloth. Starting at your neck, lather your body down to your toes. Make sure you follow these instructions: If you will be having surgery, pay special attention to the part of your body where you will be having surgery. Scrub this area for at least 1 minute. Do not use CHG on your head or face. If the solution gets into your ears or eyes, rinse them well with water. Avoid your genital area. Avoid any areas of skin that have broken skin, cuts, or scrapes. Scrub your back and under your arms. Make sure to wash skin folds. Let the lather sit on your skin  for 1-2 minutes or as  long as told by your health care provider. Using a different clean, wet washcloth, thoroughly rinse your entire body. Make sure that all body creases and crevices are rinsed well. Dry off with a clean towel. Do not put any substances on your body afterward--such as powder, lotion, or perfume--unless you are told to do so by your health care provider. Only use lotions that are recommended by the manufacturer. Put on clean clothes or pajamas. If it is the night before your surgery, sleep in clean sheets. How to use CHG prepackaged cloths Only use CHG cloths as told by your health care provider, and follow the instructions on the label. Use the CHG cloth on clean, dry skin. Do not use the CHG cloth on your head or face unless your health care provider tells you to. When washing with the CHG cloth: Avoid your genital area. Avoid any areas of skin that have broken skin, cuts, or scrapes. Before surgery Follow these steps when using a CHG cloth to clean before surgery (unless your health care provider gives you different instructions): Using the CHG cloth, vigorously scrub the part of your body where you will be having surgery. Scrub using a back-and-forth motion for 3 minutes. The area on your body should be completely wet with CHG when you are done scrubbing. Do not rinse. Discard the cloth and let the area air-dry. Do not put any substances on the area afterward, such as powder, lotion, or perfume. Put on clean clothes or pajamas. If it is the night before your surgery, sleep in clean sheets.  For general bathing Follow these steps when using CHG cloths for general bathing (unless your health care provider gives you different instructions). Use a separate CHG cloth for each area of your body. Make sure you wash between any folds of skin and between your fingers and toes. Wash your body in the following order, switching to a new cloth after each step: The front of your neck,  shoulders, and chest. Both of your arms, under your arms, and your hands. Your stomach and groin area, avoiding the genitals. Your right leg and foot. Your left leg and foot. The back of your neck, your back, and your buttocks. Do not rinse. Discard the cloth and let the area air-dry. Do not put any substances on your body afterward--such as powder, lotion, or perfume--unless you are told to do so by your health care provider. Only use lotions that are recommended by the manufacturer. Put on clean clothes or pajamas. Contact a health care provider if: Your skin gets irritated after scrubbing. You have questions about using your solution or cloth. You swallow any chlorhexidine. Call your local poison control center (1-980-130-5065 in the [place] Get help right away if: Your eyes itch badly, or they become very red or swollen. Your skin itches badly and is red or swollen. Your hearing changes. You have trouble seeing. You have swelling or tingling in your mouth or throat. You have trouble breathing. These symptoms may represent a serious problem that is an emergency. Do not wait to see if the symptoms will go away. Get medical help right away. Call your local emergency services (911 in the [place] Do not drive yourself to the hospital. Summary Chlorhexidine gluconate (CHG) is a germ-killing (antiseptic) solution that is used to clean the skin. Cleaning your skin with CHG may help to lower your risk for infection. You may be given CHG to use for bathing. It may be in a bottle or  in a prepackaged cloth to use on your skin. Carefully follow your health care provider's instructions and the instructions on the product label. Do not use CHG if you have a chlorhexidine allergy. Contact your health care provider if your skin gets irritated after scrubbing. This information is not intended to replace advice given to you by your health care provider. Make sure you discuss any questions you have with your  health care provider. Document Revised: 11/07/2021 Document Reviewed: 09/20/2020 Elsevier Patient Education  [place].

## 2022-03-29 NOTE — Addendum Note (Signed)
Addended byCandice Camp on: 03/29/2022 10:58 AM   Modules accepted: Orders

## 2022-03-30 ENCOUNTER — Other Ambulatory Visit: Payer: Self-pay

## 2022-03-30 ENCOUNTER — Encounter (HOSPITAL_COMMUNITY): Payer: Self-pay

## 2022-03-30 ENCOUNTER — Encounter (HOSPITAL_COMMUNITY)
Admission: RE | Admit: 2022-03-30 | Discharge: 2022-03-30 | Disposition: A | Discharge status: Home / Self Care | Payer: Medicare Other | Source: Ambulatory Visit | Attending: Orthopedic Surgery | Admitting: Orthopedic Surgery

## 2022-03-30 VITALS — BP 140/47 | HR 58 | Temp 97.8°F | Resp 18 | Ht 62.0 in | Wt 179.0 lb

## 2022-03-30 DIAGNOSIS — Z01818 Encounter for other preprocedural examination: Secondary | ICD-10-CM | POA: Diagnosis not present

## 2022-03-30 DIAGNOSIS — I10 Essential (primary) hypertension: Secondary | ICD-10-CM | POA: Diagnosis not present

## 2022-03-30 LAB — CBC WITH DIFFERENTIAL/PLATELET
Abs Immature Granulocytes: 0.02 10*3/uL (ref 0.00–0.07)
Basophils Absolute: 0 10*3/uL (ref 0.0–0.1)
Basophils Relative: 0 %
Eosinophils Absolute: 0.2 10*3/uL (ref 0.0–0.5)
Eosinophils Relative: 2 %
HCT: 39 % (ref 36.0–46.0)
Hemoglobin: 12.9 g/dL (ref 12.0–15.0)
Immature Granulocytes: 0 %
Lymphocytes Relative: 31 %
Lymphs Abs: 2.2 10*3/uL (ref 0.7–4.0)
MCH: 29.4 pg (ref 26.0–34.0)
MCHC: 33.1 g/dL (ref 30.0–36.0)
MCV: 88.8 fL (ref 80.0–100.0)
Monocytes Absolute: 0.5 10*3/uL (ref 0.1–1.0)
Monocytes Relative: 7 %
Neutro Abs: 4.1 10*3/uL (ref 1.7–7.7)
Neutrophils Relative %: 60 %
Platelets: 371 10*3/uL (ref 150–400)
RBC: 4.39 MIL/uL (ref 3.87–5.11)
RDW: 12.2 % (ref 11.5–15.5)
WBC: 6.9 10*3/uL (ref 4.0–10.5)
nRBC: 0 % (ref 0.0–0.2)

## 2022-03-30 LAB — BASIC METABOLIC PANEL
Anion gap: 7 (ref 5–15)
BUN: 19 mg/dL (ref 8–23)
CO2: 29 mmol/L (ref 22–32)
Calcium: 9.2 mg/dL (ref 8.9–10.3)
Chloride: 100 mmol/L (ref 98–111)
Creatinine, Ser: 0.91 mg/dL (ref 0.44–1.00)
GFR, Estimated: >60 mL/min (ref >60)
Glucose, Bld: 89 mg/dL (ref 70–99)
Potassium: 3.7 mmol/L (ref 3.5–5.1)
Sodium: 136 mmol/L (ref 135–145)

## 2022-04-03 ENCOUNTER — Ambulatory Visit (HOSPITAL_COMMUNITY): Payer: Medicare Other | Admitting: Anesthesiology

## 2022-04-03 DIAGNOSIS — E559 Vitamin D deficiency, unspecified: Secondary | ICD-10-CM | POA: Diagnosis not present

## 2022-04-03 DIAGNOSIS — I10 Essential (primary) hypertension: Secondary | ICD-10-CM | POA: Diagnosis not present

## 2022-04-03 DIAGNOSIS — E7801 Familial hypercholesterolemia: Secondary | ICD-10-CM | POA: Diagnosis not present

## 2022-04-03 DIAGNOSIS — R7303 Prediabetes: Secondary | ICD-10-CM | POA: Diagnosis not present

## 2022-04-03 NOTE — H&P (Addendum)
Admission history and physical for outpatient surgery for left carpal tunnel release  Chief Complaint  Patient presents with   Carpal Tunnel      Left       HPI: 71 year old female complains of pain paresthesias of the thumb index long and part of the ring finger of the left hand.  She has had symptoms now for several months.  She complains of trouble picking things up.  She says it feels like Beazer in her hand.  Dr. Luna Glasgow has seen her and instituted some measures of nonoperative treatment she went for nerve conduction study which showed severe disease   Plan: Impression: The above electrodiagnostic study is ABNORMAL and reveals evidence of a severe left median nerve entrapment at the wrist (carpal tunnel syndrome) affecting sensory and motor components. **The lesion is characterized by sensory and motor demyelination with evidence of significant axonal injury.  Even with appropriate decompression there may be residual symptoms.     There is no significant electrodiagnostic evidence of any other focal nerve entrapment, brachial plexopathy or cervical radiculopathy.          Past Medical History:  Diagnosis Date   Anxiety     Arthritis     Coronary artery disease      LAD Cypher stent with PCI of the diagonal in 2004   Depression     Dyslipidemia     Essential hypertension     Fibromyalgia     History of migraine     Pre-diabetes        Past Surgical History:  Procedure Laterality Date   ABDOMINAL HYSTERECTOMY     partial first, then full   BACK SURGERY     01-30-2017 [place] Right    CHOLECYSTECTOMY     COLONOSCOPY     CORONARY ANGIOPLASTY  2004   REPLACEMENT TOTAL KNEE Right    SHOULDER ARTHROSCOPY Right    TOTAL HIP ARTHROPLASTY Left    TOTAL HIP ARTHROPLASTY Right 06/01/2017   Procedure: RIGHT TOTAL HIP ARTHROPLASTY ANTERIOR APPROACH;  Surgeon: Mcarthur Rossetti, MD;  Location: [facility];  Service: Orthopedics;   Laterality: Right;  Needs RNFA   TUBAL LIGATION     Family History  Problem Relation Age of Onset   Heart attack Father 73   Lung cancer Brother     Social History   Tobacco Use   Smoking status: Former    Packs/day: 0.75    Years: 45.00    Total pack years: 33.75    Types: Cigarettes    Quit date: 01/30/2017    Years since quitting: 5.1   Smokeless tobacco: Never  Vaping Use   Vaping Use: Never used  Substance Use Topics   Alcohol use: Yes    Comment: occasional glass of wine   Drug use: No   ROS  Review of systems noncontributory   Ht '5\' 2"'$  (1.575 m)   Wt 179 lb (81.2 kg)   BMI 32.74 kg/m      General appearance: Well-developed well-nourished no gross deformities  Cardiovascular normal pulse and perfusion normal color without edema  Neurologically no sensation loss or deficits or pathologic reflexes   Psychological: Awake alert and oriented x3 mood and affect normal   Skin no lacerations or ulcerations no nodularity no palpable masses, no erythema or nodularity   Musculoskeletal: 71 year old female with pain and paresthesias of the left upper extremity she has tingling and  numbness decree sensation in the thumb index long and ring finger but only part of it.  She also has tenderness over the basal joint of the thumb   She has crepitance and grinding.       A/P  Left carpal tunnel syndrome  Open left carpal tunnel release   71 year old female with severe disease   I told her that she also has some basilar joint thumb arthritis/I also think that her improvement may be partial perhaps 85% improvement.  She may need further work-up regarding the basilar joint arthritis of the thumb

## 2022-04-04 ENCOUNTER — Ambulatory Visit (HOSPITAL_COMMUNITY)
Admission: RE | Admit: 2022-04-04 | Discharge: 2022-04-04 | Disposition: A | Discharge status: Home / Self Care | Payer: Medicare Other | Attending: Orthopedic Surgery | Admitting: Orthopedic Surgery

## 2022-04-04 ENCOUNTER — Telehealth: Payer: Self-pay | Admitting: Radiology

## 2022-04-04 ENCOUNTER — Telehealth: Payer: Self-pay | Admitting: Orthopedic Surgery

## 2022-04-04 ENCOUNTER — Encounter (HOSPITAL_COMMUNITY): Payer: Self-pay | Admitting: Orthopedic Surgery

## 2022-04-04 DIAGNOSIS — G5602 Carpal tunnel syndrome, left upper limb: Secondary | ICD-10-CM | POA: Diagnosis not present

## 2022-04-04 DIAGNOSIS — R7303 Prediabetes: Secondary | ICD-10-CM | POA: Diagnosis not present

## 2022-04-04 DIAGNOSIS — I10 Essential (primary) hypertension: Secondary | ICD-10-CM

## 2022-04-04 LAB — GLUCOSE, CAPILLARY: Glucose-Capillary: 103 mg/dL — ABNORMAL HIGH (ref 70–99)

## 2022-04-04 MED ORDER — LACTATED RINGERS IV SOLN: INTRAVENOUS | Status: DC

## 2022-04-04 MED ORDER — PROPOFOL 500 MG/50ML IV EMUL
INTRAVENOUS | Status: AC
  Filled 2022-04-04: qty 50

## 2022-04-04 MED ORDER — ORAL CARE MOUTH RINSE: 15.0000 mL | Freq: Once | OROMUCOSAL | Status: AC

## 2022-04-04 MED ORDER — VANCOMYCIN HCL IN DEXTROSE 1-5 GM/200ML-% IV SOLN
1000.0000 mg | INTRAVENOUS | Status: DC
  Filled 2022-04-04: qty 200

## 2022-04-04 MED ORDER — CHLORHEXIDINE GLUCONATE 0.12 % MT SOLN
15.0000 mL | Freq: Once | OROMUCOSAL | Status: AC
  Administered 2022-04-04: 15 mL via OROMUCOSAL

## 2022-04-04 MED ORDER — DEXMEDETOMIDINE HCL IN NACL 80 MCG/20ML IV SOLN
INTRAVENOUS | Status: AC
  Filled 2022-04-04: qty 20

## 2022-04-04 MED ORDER — LIDOCAINE HCL (PF) 0.5 % IJ SOLN
INTRAMUSCULAR | Status: AC
  Filled 2022-04-04: qty 50

## 2022-04-04 MED ORDER — FENTANYL CITRATE (PF) 100 MCG/2ML IJ SOLN
INTRAMUSCULAR | Status: AC
  Filled 2022-04-04: qty 2

## 2022-04-04 MED ORDER — MIDAZOLAM HCL 2 MG/2ML IJ SOLN
INTRAMUSCULAR | Status: AC
  Filled 2022-04-04: qty 2

## 2022-04-04 NOTE — Telephone Encounter (Signed)
I called patient about RS surgery to this Friday she said that's fine

## 2022-04-04 NOTE — Interval H&P Note (Signed)
History and Physical Interval Note:  04/04/2022 9:01 AM  [name]  has presented today for surgery, with the diagnosis of left carpal tunnel syndrome.  The various methods of treatment have been discussed with the patient and family. After consideration of risks, benefits and other options for treatment, the patient has consented to  Procedure(s): CARPAL TUNNEL RELEASE (Left) as a surgical intervention.  The patient's history has been reviewed, patient examined, no change in status, stable for surgery.  I have reviewed the patient's chart and labs.  Questions were answered to the patient's satisfaction.     Arther Abbott

## 2022-04-04 NOTE — Telephone Encounter (Signed)
Voice message from patient about her surgery being cancelled today due to a type of malfunction of equipment; asking for a call when we have further information.  I returned call to acknowledge message, left message on voice mail

## 2022-04-04 NOTE — Progress Notes (Signed)
Patient ready for surgery.  OR air Exchange malfunctioning and all OR cases cancelled.  Office to call to reschedule. Patient disappointed but understanding.

## 2022-04-04 NOTE — Anesthesia Preprocedure Evaluation (Deleted)
Anesthesia Evaluation  Patient identified by MRN, date of birth, ID band Patient awake    Reviewed: Allergy & Precautions, NPO status , Patient's Chart, lab work & pertinent test results  History of Anesthesia Complications (+) PROLONGED EMERGENCE and history of anesthetic complications  Airway Mallampati: II  TM Distance: >3 FB Neck ROM: Full    Dental  (+) Dental Advisory Given, Missing   Pulmonary neg pulmonary ROS, former smoker,    Pulmonary exam normal breath sounds clear to auscultation       Cardiovascular Exercise Tolerance: Good hypertension, Pt. on medications + angina + CAD and + Cardiac Stents  Normal cardiovascular exam Rhythm:Regular Rate:Normal     Neuro/Psych PSYCHIATRIC DISORDERS Anxiety Depression  Neuromuscular disease    GI/Hepatic negative GI ROS, Neg liver ROS,   Endo/Other  negative endocrine ROS  Renal/GU negative Renal ROS  negative genitourinary   Musculoskeletal  (+) Arthritis , Osteoarthritis,  Fibromyalgia -  Abdominal   Peds negative pediatric ROS (+)  Hematology negative hematology ROS (+)   Anesthesia Other Findings   Reproductive/Obstetrics negative OB ROS                             Anesthesia Physical Anesthesia Plan  ASA: 3  Anesthesia Plan: Bier Block and Bier Block-LIDOCAINE ONLY   Post-op Pain Management: Minimal or no pain anticipated   Induction:   PONV Risk Score and Plan: 2 and Ondansetron  Airway Management Planned: Nasal Cannula and Natural Airway  Additional Equipment:   Intra-op Plan:   Post-operative Plan:   Informed Consent: I have reviewed the patients History and Physical, chart, labs and discussed the procedure including the risks, benefits and alternatives for the proposed anesthesia with the patient or authorized representative who has indicated his/her understanding and acceptance.     Dental advisory  given  Plan Discussed with: CRNA and Surgeon  Anesthesia Plan Comments: (Possible GA with airway was discussed.)        Anesthesia Quick Evaluation

## 2022-04-05 ENCOUNTER — Other Ambulatory Visit: Payer: Self-pay

## 2022-04-05 ENCOUNTER — Encounter (HOSPITAL_COMMUNITY): Payer: Self-pay

## 2022-04-06 ENCOUNTER — Encounter (HOSPITAL_COMMUNITY)
Admission: RE | Admit: 2022-04-06 | Discharge: 2022-04-06 | Disposition: A | Discharge status: Home / Self Care | Payer: Medicare Other | Source: Home / Self Care | Attending: Orthopedic Surgery | Admitting: Orthopedic Surgery

## 2022-04-06 ENCOUNTER — Encounter (HOSPITAL_COMMUNITY): Payer: Self-pay | Admitting: Anesthesiology

## 2022-04-07 ENCOUNTER — Ambulatory Visit (HOSPITAL_COMMUNITY)
Admission: RE | Admit: 2022-04-07 | Discharge: 2022-04-07 | Disposition: A | Discharge status: Home / Self Care | Payer: Medicare Other | Attending: Orthopedic Surgery | Admitting: Orthopedic Surgery

## 2022-04-07 ENCOUNTER — Encounter (HOSPITAL_COMMUNITY): Payer: Self-pay | Admitting: Orthopedic Surgery

## 2022-04-07 ENCOUNTER — Encounter (HOSPITAL_COMMUNITY)
Admission: RE | Disposition: A | Discharge status: Home / Self Care | Payer: Self-pay | Source: Home / Self Care | Attending: Orthopedic Surgery

## 2022-04-07 DIAGNOSIS — G5602 Carpal tunnel syndrome, left upper limb: Secondary | ICD-10-CM

## 2022-04-07 SURGERY — CARPAL TUNNEL RELEASE
Anesthesia: Choice

## 2022-04-07 SURGERY — CARPAL TUNNEL RELEASE
Anesthesia: Regional | Laterality: Left

## 2022-04-07 MED ORDER — VANCOMYCIN HCL IN DEXTROSE 1-5 GM/200ML-% IV SOLN: 1000.0000 mg | Freq: Once | INTRAVENOUS | Status: DC

## 2022-04-07 MED ORDER — ORAL CARE MOUTH RINSE: 15.0000 mL | Freq: Once | OROMUCOSAL | Status: DC

## 2022-04-07 MED ORDER — BUPIVACAINE HCL (PF) 0.5 % IJ SOLN
INTRAMUSCULAR | Status: AC
  Filled 2022-04-07: qty 30

## 2022-04-07 MED ORDER — LACTATED RINGERS IV SOLN: INTRAVENOUS | Status: DC

## 2022-04-07 MED ORDER — LIDOCAINE HCL (PF) 1 % IJ SOLN
INTRAMUSCULAR | Status: AC
  Filled 2022-04-07: qty 30

## 2022-04-07 MED ORDER — CHLORHEXIDINE GLUCONATE 0.12 % MT SOLN: 15.0000 mL | Freq: Once | OROMUCOSAL | Status: DC

## 2022-04-07 NOTE — Anesthesia Preprocedure Evaluation (Deleted)
Anesthesia Evaluation  Patient identified by MRN, date of birth, ID band Patient awake    Reviewed: Allergy & Precautions, NPO status , Patient's Chart, lab work & pertinent test results  History of Anesthesia Complications (+) PROLONGED EMERGENCE and history of anesthetic complications  Airway        Dental  (+) Dental Advisory Given   Pulmonary neg pulmonary ROS, former smoker,    Pulmonary exam normal breath sounds clear to auscultation       Cardiovascular hypertension, Pt. on medications + angina + CAD and + Cardiac Stents  Normal cardiovascular exam Rhythm:Regular Rate:Normal     Neuro/Psych PSYCHIATRIC DISORDERS Anxiety Depression  Neuromuscular disease    GI/Hepatic negative GI ROS, Neg liver ROS,   Endo/Other  negative endocrine ROS  Renal/GU negative Renal ROS  negative genitourinary   Musculoskeletal  (+) Arthritis , Osteoarthritis,  Fibromyalgia -, narcotic dependent  Abdominal   Peds negative pediatric ROS (+)  Hematology negative hematology ROS (+)   Anesthesia Other Findings   Reproductive/Obstetrics negative OB ROS                             Anesthesia Physical Anesthesia Plan  ASA: 3  Anesthesia Plan: Bier Block and Bier Block-LIDOCAINE ONLY   Post-op Pain Management: Minimal or no pain anticipated   Induction: Intravenous  PONV Risk Score and Plan: 2 and Propofol infusion and Ondansetron  Airway Management Planned: Nasal Cannula and Natural Airway  Additional Equipment:   Intra-op Plan:   Post-operative Plan:   Informed Consent: I have reviewed the patients History and Physical, chart, labs and discussed the procedure including the risks, benefits and alternatives for the proposed anesthesia with the patient or authorized representative who has indicated his/her understanding and acceptance.     Dental advisory given  Plan Discussed with: CRNA and  Surgeon  Anesthesia Plan Comments:         Anesthesia Quick Evaluation

## 2022-04-07 NOTE — H&P (View-Only) (Signed)
Admission history and physical for outpatient surgery for left carpal tunnel release       Chief Complaint  Patient presents with   Carpal Tunnel      Left       HPI: 71 year old female complains of pain paresthesias of the thumb index long and part of the ring finger of the left hand.  She has had symptoms now for several months.  She complains of trouble picking things up.  She says it feels like Beazer in her hand.  Dr. Luna Glasgow has seen her and instituted some measures of nonoperative treatment she went for nerve conduction study which showed severe disease   Plan: Impression: The above electrodiagnostic study is ABNORMAL and reveals evidence of a severe left median nerve entrapment at the wrist (carpal tunnel syndrome) affecting sensory and motor components. **The lesion is characterized by sensory and motor demyelination with evidence of significant axonal injury.  Even with appropriate decompression there may be residual symptoms.     There is no significant electrodiagnostic evidence of any other focal nerve entrapment, brachial plexopathy or cervical radiculopathy.             Past Medical History:  Diagnosis Date   Anxiety     Arthritis     Coronary artery disease      LAD Cypher stent with PCI of the diagonal in 2004   Depression     Dyslipidemia     Essential hypertension     Fibromyalgia     History of migraine     Pre-diabetes             Past Surgical History:  Procedure Laterality Date   ABDOMINAL HYSTERECTOMY        partial first, then full   BACK SURGERY        01-30-2017 [place] Right     CHOLECYSTECTOMY       COLONOSCOPY       CORONARY ANGIOPLASTY   2004   REPLACEMENT TOTAL KNEE Right     SHOULDER ARTHROSCOPY Right     TOTAL HIP ARTHROPLASTY Left     TOTAL HIP ARTHROPLASTY Right 06/01/2017    Procedure: RIGHT TOTAL HIP ARTHROPLASTY ANTERIOR APPROACH;  Surgeon: Mcarthur Rossetti, MD;  Location:  [facility];  Service: Orthopedics;  Laterality: Right;  Needs RNFA   TUBAL LIGATION             Family History  Problem Relation Age of Onset   Heart attack Father 61   Lung cancer Brother        Social History         Tobacco Use   Smoking status: Former      Packs/day: 0.75      Years: 45.00      Total pack years: 33.75      Types: Cigarettes      Quit date: 01/30/2017      Years since quitting: 5.1   Smokeless tobacco: Never  Vaping Use   Vaping Use: Never used  Substance Use Topics   Alcohol use: Yes      Comment: occasional glass of wine   Drug use: No    ROS   Review of systems noncontributory     Ht '5\' 2"'$  (1.575 m)   Wt 179 lb (81.2 kg)   BMI 32.74 kg/m      General appearance: Well-developed well-nourished no gross deformities  Cardiovascular normal pulse and perfusion normal color without edema  Neurologically no sensation loss or deficits or pathologic reflexes   Psychological: Awake alert and oriented x3 mood and affect normal   Skin no lacerations or ulcerations no nodularity no palpable masses, no erythema or nodularity   Musculoskeletal: 71 year old female with pain and paresthesias of the left upper extremity she has tingling and numbness decree sensation in the thumb index long and ring finger but only part of it.  She also has tenderness over the basal joint of the thumb   She has crepitance and grinding.       A/P   Left carpal tunnel syndrome   Open left carpal tunnel release   71 year old female with severe disease   I told her that she also has some basilar joint thumb arthritis/I also think that her improvement may be partial perhaps 85% improvement.  She may need further work-up regarding the basilar joint arthritis of the thumb

## 2022-04-07 NOTE — H&P (Signed)
Admission history and physical for outpatient surgery for left carpal tunnel release       Chief Complaint  Patient presents with   Carpal Tunnel      Left       HPI: 71 year old female complains of pain paresthesias of the thumb index long and part of the ring finger of the left hand.  She has had symptoms now for several months.  She complains of trouble picking things up.  She says it feels like Beazer in her hand.  Dr. Luna Glasgow has seen her and instituted some measures of nonoperative treatment she went for nerve conduction study which showed severe disease   Plan: Impression: The above electrodiagnostic study is ABNORMAL and reveals evidence of a severe left median nerve entrapment at the wrist (carpal tunnel syndrome) affecting sensory and motor components. **The lesion is characterized by sensory and motor demyelination with evidence of significant axonal injury.  Even with appropriate decompression there may be residual symptoms.     There is no significant electrodiagnostic evidence of any other focal nerve entrapment, brachial plexopathy or cervical radiculopathy.             Past Medical History:  Diagnosis Date   Anxiety     Arthritis     Coronary artery disease      LAD Cypher stent with PCI of the diagonal in 2004   Depression     Dyslipidemia     Essential hypertension     Fibromyalgia     History of migraine     Pre-diabetes             Past Surgical History:  Procedure Laterality Date   ABDOMINAL HYSTERECTOMY        partial first, then full   BACK SURGERY        01-30-2017 [place] Right     CHOLECYSTECTOMY       COLONOSCOPY       CORONARY ANGIOPLASTY   2004   REPLACEMENT TOTAL KNEE Right     SHOULDER ARTHROSCOPY Right     TOTAL HIP ARTHROPLASTY Left     TOTAL HIP ARTHROPLASTY Right 06/01/2017    Procedure: RIGHT TOTAL HIP ARTHROPLASTY ANTERIOR APPROACH;  Surgeon: Mcarthur Rossetti, MD;  Location:  [facility];  Service: Orthopedics;  Laterality: Right;  Needs RNFA   TUBAL LIGATION             Family History  Problem Relation Age of Onset   Heart attack Father 51   Lung cancer Brother        Social History         Tobacco Use   Smoking status: Former      Packs/day: 0.75      Years: 45.00      Total pack years: 33.75      Types: Cigarettes      Quit date: 01/30/2017      Years since quitting: 5.1   Smokeless tobacco: Never  Vaping Use   Vaping Use: Never used  Substance Use Topics   Alcohol use: Yes      Comment: occasional glass of wine   Drug use: No    ROS   Review of systems noncontributory     Ht '5\' 2"'$  (1.575 m)   Wt 179 lb (81.2 kg)   BMI 32.74 kg/m      General appearance: Well-developed well-nourished no gross deformities  Cardiovascular normal pulse and perfusion normal color without edema  Neurologically no sensation loss or deficits or pathologic reflexes   Psychological: Awake alert and oriented x3 mood and affect normal   Skin no lacerations or ulcerations no nodularity no palpable masses, no erythema or nodularity   Musculoskeletal: 71 year old female with pain and paresthesias of the left upper extremity she has tingling and numbness decree sensation in the thumb index long and ring finger but only part of it.  She also has tenderness over the basal joint of the thumb   She has crepitance and grinding.       A/P   Left carpal tunnel syndrome   Open left carpal tunnel release   71 year old female with severe disease   I told her that she also has some basilar joint thumb arthritis/I also think that her improvement may be partial perhaps 85% improvement.  She may need further work-up regarding the basilar joint arthritis of the thumb

## 2022-04-10 ENCOUNTER — Encounter (HOSPITAL_COMMUNITY)
Admission: RE | Admit: 2022-04-10 | Discharge: 2022-04-10 | Disposition: A | Discharge status: Home / Self Care | Payer: Medicare Other | Source: Home / Self Care | Attending: Orthopedic Surgery | Admitting: Orthopedic Surgery

## 2022-04-10 ENCOUNTER — Other Ambulatory Visit: Payer: Self-pay | Admitting: Radiology

## 2022-04-10 DIAGNOSIS — G5600 Carpal tunnel syndrome, unspecified upper limb: Secondary | ICD-10-CM | POA: Diagnosis not present

## 2022-04-10 DIAGNOSIS — E7801 Familial hypercholesterolemia: Secondary | ICD-10-CM | POA: Diagnosis not present

## 2022-04-10 DIAGNOSIS — R7303 Prediabetes: Secondary | ICD-10-CM | POA: Diagnosis not present

## 2022-04-10 DIAGNOSIS — F32A Depression, unspecified: Secondary | ICD-10-CM | POA: Diagnosis not present

## 2022-04-10 DIAGNOSIS — I10 Essential (primary) hypertension: Secondary | ICD-10-CM | POA: Diagnosis not present

## 2022-04-10 DIAGNOSIS — Z0000 Encounter for general adult medical examination without abnormal findings: Secondary | ICD-10-CM | POA: Diagnosis not present

## 2022-04-10 DIAGNOSIS — F09 Unspecified mental disorder due to known physiological condition: Secondary | ICD-10-CM | POA: Diagnosis not present

## 2022-04-10 DIAGNOSIS — M797 Fibromyalgia: Secondary | ICD-10-CM | POA: Diagnosis not present

## 2022-04-10 DIAGNOSIS — R252 Cramp and spasm: Secondary | ICD-10-CM | POA: Diagnosis not present

## 2022-04-10 DIAGNOSIS — Z01818 Encounter for other preprocedural examination: Secondary | ICD-10-CM

## 2022-04-10 DIAGNOSIS — G5602 Carpal tunnel syndrome, left upper limb: Secondary | ICD-10-CM

## 2022-04-10 DIAGNOSIS — Z23 Encounter for immunization: Secondary | ICD-10-CM | POA: Diagnosis not present

## 2022-04-10 DIAGNOSIS — E559 Vitamin D deficiency, unspecified: Secondary | ICD-10-CM | POA: Diagnosis not present

## 2022-04-10 NOTE — Telephone Encounter (Signed)
Done

## 2022-04-10 NOTE — Telephone Encounter (Signed)
-----   Message from Encarnacion Chu, RN sent at 04/07/2022  2:43 PM EDT ----- Regarding: orders Hey [name]! Will re re-enter orders on [name] please? Her orders had been released when she was cancelled and they need to be reentered. Thank you!

## 2022-04-11 ENCOUNTER — Ambulatory Visit (HOSPITAL_COMMUNITY)
Admission: RE | Admit: 2022-04-11 | Discharge: 2022-04-11 | Disposition: A | Discharge status: Home / Self Care | Payer: Medicare Other | Attending: Orthopedic Surgery | Admitting: Orthopedic Surgery

## 2022-04-11 ENCOUNTER — Encounter (HOSPITAL_COMMUNITY): Payer: Self-pay | Admitting: Orthopedic Surgery

## 2022-04-11 ENCOUNTER — Ambulatory Visit (HOSPITAL_BASED_OUTPATIENT_CLINIC_OR_DEPARTMENT_OTHER): Payer: Medicare Other | Admitting: Certified Registered Nurse Anesthetist

## 2022-04-11 ENCOUNTER — Encounter (HOSPITAL_COMMUNITY)
Admission: RE | Disposition: A | Discharge status: Home / Self Care | Payer: Self-pay | Source: Home / Self Care | Attending: Orthopedic Surgery

## 2022-04-11 ENCOUNTER — Ambulatory Visit (HOSPITAL_COMMUNITY): Payer: Medicare Other | Admitting: Certified Registered Nurse Anesthetist

## 2022-04-11 DIAGNOSIS — I10 Essential (primary) hypertension: Secondary | ICD-10-CM | POA: Insufficient documentation

## 2022-04-11 DIAGNOSIS — G5602 Carpal tunnel syndrome, left upper limb: Secondary | ICD-10-CM

## 2022-04-11 DIAGNOSIS — M797 Fibromyalgia: Secondary | ICD-10-CM | POA: Diagnosis not present

## 2022-04-11 DIAGNOSIS — I2510 Atherosclerotic heart disease of native coronary artery without angina pectoris: Secondary | ICD-10-CM

## 2022-04-11 DIAGNOSIS — Z955 Presence of coronary angioplasty implant and graft: Secondary | ICD-10-CM | POA: Insufficient documentation

## 2022-04-11 DIAGNOSIS — Z87891 Personal history of nicotine dependence: Secondary | ICD-10-CM | POA: Insufficient documentation

## 2022-04-11 DIAGNOSIS — G5603 Carpal tunnel syndrome, bilateral upper limbs: Secondary | ICD-10-CM | POA: Diagnosis not present

## 2022-04-11 DIAGNOSIS — G5601 Carpal tunnel syndrome, right upper limb: Secondary | ICD-10-CM

## 2022-04-11 HISTORY — PX: CARPAL TUNNEL RELEASE: SHX101

## 2022-04-11 SURGERY — CARPAL TUNNEL RELEASE
Anesthesia: Choice | Laterality: Left

## 2022-04-11 SURGERY — CARPAL TUNNEL RELEASE
Anesthesia: General | Site: Hand | Laterality: Left

## 2022-04-11 MED ORDER — CHLORHEXIDINE GLUCONATE 0.12 % MT SOLN
15.0000 mL | Freq: Once | OROMUCOSAL | Status: AC
  Administered 2022-04-11: 15 mL via OROMUCOSAL

## 2022-04-11 MED ORDER — PROPOFOL 10 MG/ML IV BOLUS
INTRAVENOUS | Status: DC | PRN
  Administered 2022-04-11: 50 mg via INTRAVENOUS

## 2022-04-11 MED ORDER — PROPOFOL 10 MG/ML IV BOLUS
INTRAVENOUS | Status: AC
  Filled 2022-04-11: qty 20

## 2022-04-11 MED ORDER — OXYCODONE HCL 5 MG PO TABS: 5.0000 mg | ORAL_TABLET | Freq: Once | ORAL | Status: DC | PRN

## 2022-04-11 MED ORDER — LIDOCAINE 2% (20 MG/ML) 5 ML SYRINGE
INTRAMUSCULAR | Status: DC | PRN
  Administered 2022-04-11: 50 mg via INTRAVENOUS

## 2022-04-11 MED ORDER — LACTATED RINGERS IV SOLN: INTRAVENOUS | Status: DC

## 2022-04-11 MED ORDER — KETAMINE HCL 10 MG/ML IJ SOLN
INTRAMUSCULAR | Status: DC | PRN
  Administered 2022-04-11: 20 mg via INTRAVENOUS

## 2022-04-11 MED ORDER — FENTANYL CITRATE PF 50 MCG/ML IJ SOSY: 25.0000 ug | PREFILLED_SYRINGE | INTRAMUSCULAR | Status: DC | PRN

## 2022-04-11 MED ORDER — BUPIVACAINE HCL (PF) 0.5 % IJ SOLN
INTRAMUSCULAR | Status: AC
  Filled 2022-04-11: qty 30

## 2022-04-11 MED ORDER — 0.9 % SODIUM CHLORIDE (POUR BTL) OPTIME
TOPICAL | Status: DC | PRN
  Administered 2022-04-11: 1000 mL

## 2022-04-11 MED ORDER — KETAMINE HCL 50 MG/5ML IJ SOSY
PREFILLED_SYRINGE | INTRAMUSCULAR | Status: AC
  Filled 2022-04-11: qty 5

## 2022-04-11 MED ORDER — LIDOCAINE HCL (PF) 1 % IJ SOLN
INTRAMUSCULAR | Status: AC
  Filled 2022-04-11: qty 30

## 2022-04-11 MED ORDER — OXYCODONE HCL 5 MG/5ML PO SOLN: 5.0000 mg | Freq: Once | ORAL | Status: DC | PRN

## 2022-04-11 MED ORDER — BUPIVACAINE HCL (PF) 0.5 % IJ SOLN
INTRAMUSCULAR | Status: DC | PRN
  Administered 2022-04-11: 10 mL

## 2022-04-11 MED ORDER — ORAL CARE MOUTH RINSE: 15.0000 mL | Freq: Once | OROMUCOSAL | Status: AC

## 2022-04-11 MED ORDER — HYDROCODONE-ACETAMINOPHEN 5-325 MG PO TABS: 1.0000 | ORAL_TABLET | Freq: Four times a day (QID) | ORAL | 0 refills | Status: AC | PRN

## 2022-04-11 MED ORDER — ONDANSETRON HCL 4 MG/2ML IJ SOLN: 4.0000 mg | Freq: Once | INTRAMUSCULAR | Status: DC | PRN

## 2022-04-11 MED ORDER — LIDOCAINE HCL (PF) 1 % IJ SOLN
INTRAMUSCULAR | Status: DC | PRN
  Administered 2022-04-11: 10 mL

## 2022-04-11 MED ORDER — LIDOCAINE HCL (PF) 2 % IJ SOLN
INTRAMUSCULAR | Status: AC
  Filled 2022-04-11: qty 15

## 2022-04-11 MED ORDER — VANCOMYCIN HCL IN DEXTROSE 1-5 GM/200ML-% IV SOLN
1000.0000 mg | INTRAVENOUS | Status: AC
  Administered 2022-04-11: 1000 mg via INTRAVENOUS
  Filled 2022-04-11: qty 200

## 2022-04-11 MED ORDER — PROPOFOL 500 MG/50ML IV EMUL
INTRAVENOUS | Status: DC | PRN
  Administered 2022-04-11: 75 ug/kg/min via INTRAVENOUS

## 2022-04-11 SURGICAL SUPPLY — 39 items
APL PRP STRL LF DISP 70% ISPRP (MISCELLANEOUS) ×1
BANDAGE ESMARK 4X12 BL STRL LF (DISPOSABLE) ×2 IMPLANT
BLADE SURG 15 STRL LF DISP TIS (BLADE) ×2 IMPLANT
BLADE SURG 15 STRL SS (BLADE) ×1
BNDG CMPR 12X4 ELC STRL LF (DISPOSABLE) ×1
BNDG CMPR 5X4 CHSV STRCH STRL (GAUZE/BANDAGES/DRESSINGS) ×1
BNDG CMPR STD VLCR NS LF 5.8X3 (GAUZE/BANDAGES/DRESSINGS) ×1
BNDG COHESIVE 4X5 TAN STRL LF (GAUZE/BANDAGES/DRESSINGS) IMPLANT
BNDG ELASTIC 3X5.8 VLCR NS LF (GAUZE/BANDAGES/DRESSINGS) ×2 IMPLANT
BNDG ESMARK 4X12 BLUE STRL LF (DISPOSABLE) ×1
BNDG GAUZE DERMACEA FLUFF 4 (GAUZE/BANDAGES/DRESSINGS) IMPLANT
BNDG GZE DERMACEA 4 6PLY (GAUZE/BANDAGES/DRESSINGS) ×1
CHLORAPREP W/TINT 26 (MISCELLANEOUS) ×2 IMPLANT
CLOTH BEACON ORANGE TIMEOUT ST (SAFETY) ×2 IMPLANT
COVER LIGHT HANDLE STERIS (MISCELLANEOUS) ×4 IMPLANT
CUFF TOURN SGL QUICK 18X4 (TOURNIQUET CUFF) ×2 IMPLANT
ELECT NDL TIP 2.8 STRL (NEEDLE) IMPLANT
ELECT NEEDLE TIP 2.8 STRL (NEEDLE) ×1 IMPLANT
ELECT REM PT RETURN 9FT ADLT (ELECTROSURGICAL) ×1
ELECTRODE REM PT RTRN 9FT ADLT (ELECTROSURGICAL) ×2 IMPLANT
GAUZE SPONGE 4X4 12PLY STRL (GAUZE/BANDAGES/DRESSINGS) ×2 IMPLANT
GAUZE XEROFORM 1X8 LF (GAUZE/BANDAGES/DRESSINGS) ×2 IMPLANT
GLOVE BIO SURGEON STRL SZ7 (GLOVE) IMPLANT
GLOVE BIOGEL PI IND STRL 7.0 (GLOVE) ×4 IMPLANT
GLOVE BIOGEL PI IND STRL 8.5 (GLOVE) IMPLANT
GLOVE SKINSENSE STRL SZ8.0 LF (GLOVE) IMPLANT
GOWN STRL REUS W/TWL LRG LVL3 (GOWN DISPOSABLE) ×2 IMPLANT
GOWN STRL REUS W/TWL XL LVL3 (GOWN DISPOSABLE) ×2 IMPLANT
KIT TURNOVER KIT A (KITS) ×2 IMPLANT
MANIFOLD NEPTUNE II (INSTRUMENTS) ×2 IMPLANT
NDL HYPO 21X1.5 SAFETY (NEEDLE) ×2 IMPLANT
NEEDLE HYPO 21X1.5 SAFETY (NEEDLE) ×1 IMPLANT
NS IRRIG 1000ML POUR BTL (IV SOLUTION) ×2 IMPLANT
PACK BASIC LIMB (CUSTOM PROCEDURE TRAY) ×2 IMPLANT
PAD ARMBOARD 7.5X6 YLW CONV (MISCELLANEOUS) ×2 IMPLANT
POSITIONER HAND ALUMI XLG (MISCELLANEOUS) ×2 IMPLANT
SET BASIN LINEN APH (SET/KITS/TRAYS/PACK) ×2 IMPLANT
SUT ETHILON 3 0 FSL (SUTURE) ×2 IMPLANT
SYR CONTROL 10ML LL (SYRINGE) ×2 IMPLANT

## 2022-04-11 NOTE — Anesthesia Postprocedure Evaluation (Signed)
Anesthesia Post Note  Patient: [name]  Procedure(s) Performed: CARPAL TUNNEL RELEASE (Left: Hand)  Patient location during evaluation: Phase II Anesthesia Type: General Level of consciousness: awake Pain management: pain level controlled Vital Signs Assessment: post-procedure vital signs reviewed and stable Respiratory status: spontaneous breathing and respiratory function stable Cardiovascular status: blood pressure returned to baseline and stable Postop Assessment: no headache and no apparent nausea or vomiting Anesthetic complications: no Comments: Late entry   No notable events documented.   Last Vitals:  Vitals:   04/11/22 1145 04/11/22 1216  BP: (!) 123/53 96/60  Pulse: (!) 54 62  Resp: 18 20  Temp:  36.4 C  SpO2: 99% 100%    Last Pain:  Vitals:   04/11/22 1216  TempSrc: Oral  PainSc: 0-No pain                 Louann Sjogren

## 2022-04-11 NOTE — Anesthesia Preprocedure Evaluation (Signed)
Anesthesia Evaluation  Patient identified by MRN, date of birth, ID band Patient awake    Reviewed: Allergy & Precautions, H&P , NPO status , Patient's Chart, lab work & pertinent test results, reviewed documented beta blocker date and time   Airway Mallampati: II  TM Distance: >3 FB Neck ROM: full    Dental no notable dental hx.    Pulmonary neg pulmonary ROS, former smoker,    Pulmonary exam normal breath sounds clear to auscultation       Cardiovascular Exercise Tolerance: Good hypertension, + CAD and + Cardiac Stents   Rhythm:regular Rate:Normal     Neuro/Psych PSYCHIATRIC DISORDERS Anxiety Depression  Neuromuscular disease    GI/Hepatic negative GI ROS, Neg liver ROS,   Endo/Other  negative endocrine ROS  Renal/GU negative Renal ROS  negative genitourinary   Musculoskeletal   Abdominal   Peds  Hematology negative hematology ROS (+)   Anesthesia Other Findings   Reproductive/Obstetrics negative OB ROS                             Anesthesia Physical Anesthesia Plan  ASA: 3  Anesthesia Plan: General   Post-op Pain Management: Ketamine IV*   Induction:   PONV Risk Score and Plan: Propofol infusion  Airway Management Planned:   Additional Equipment:   Intra-op Plan:   Post-operative Plan:   Informed Consent: I have reviewed the patients History and Physical, chart, labs and discussed the procedure including the risks, benefits and alternatives for the proposed anesthesia with the patient or authorized representative who has indicated his/her understanding and acceptance.     Dental Advisory Given  Plan Discussed with: CRNA  Anesthesia Plan Comments:         Anesthesia Quick Evaluation

## 2022-04-11 NOTE — Transfer of Care (Signed)
Immediate Anesthesia Transfer of Care Note  Patient: PAULLETTE MCKAIN  Procedure(s) Performed: CARPAL TUNNEL RELEASE (Left: Hand)  Patient Location: PACU  Anesthesia Type:General  Level of Consciousness: awake  Airway & Oxygen Therapy: Patient Spontanous Breathing  Post-op Assessment: Report given to RN and Post -op Vital signs reviewed and stable  Post vital signs: Reviewed and stable  Last Vitals:  Vitals Value Taken Time  BP 93/61 04/11/22 1122  Temp    Pulse 58 04/11/22 1125  Resp 15 04/11/22 1125  SpO2 99 % 04/11/22 1125  Vitals shown include unvalidated device data.  Last Pain:  Vitals:   04/11/22 1011  PainSc: 5       Patients Stated Pain Goal: 6 (97/67/34 [id number])  Complications: No notable events documented.

## 2022-04-11 NOTE — Brief Op Note (Deleted)
04/11/2022  10:15 AM  PATIENT:  [name]  71 y.o. female  PRE-OPERATIVE DIAGNOSIS: Right carpal tunnel syndrome   POST-OPERATIVE DIAGNOSIS: Same   PROCEDURE:  Procedure(s): CARPAL TUNNEL RELEASE RIGHT  SURGEON:  Surgeon(s) and Role:    Carole Civil, MD - Primary  PHYSICIAN ASSISTANT:   ASSISTANTS: none   ANESTHESIA:   MAC  EBL:  none   BLOOD ADMINISTERED:none  DRAINS: none   LOCAL MEDICATIONS USED:  MARCAINE     SPECIMEN:  No Specimen  DISPOSITION OF SPECIMEN:  N/A  COUNTS:  YES  TOURNIQUET:  21 min at 29m  DICTATION: .Dragon Dictation  PLAN OF CARE: Discharge to home after PACU  PATIENT DISPOSITION:  PACU - hemodynamically stable.   Delay start of Pharmacological VTE agent (>24hrs) due to surgical blood loss or risk of bleeding: not applicable

## 2022-04-11 NOTE — Brief Op Note (Signed)
Date 04/11/2022   PRE-OPERATIVE DIAGNOSIS:  LEFT CARPAL TUNNEL SYNDROME  POST-OPERATIVE DIAGNOSIS:  LEFT CARPAL TUNNEL SYNDROME  PROCEDURE:  Procedure(s): LEFT CARPAL TUNNEL RELEASE (Right)   FINDINGS: Mild compression of the median nerve, good color normal shape no space-occupying lesions  SURGEON:  Surgeon(s) and Role:    Carole Civil, MD - Primary  PHYSICIAN ASSISTANT:   ASSISTANTS: none   ANESTHESIA:   regional  BLOOD ADMINISTERED:none  DRAINS: none   LOCAL MEDICATIONS USED: 1% plain lidocaine preop, half percent plain lidocaine postop 10 cc each for total of 20 cc  SPECIMEN:  No Specimen  DISPOSITION OF SPECIMEN:  N/A  COUNTS:  YES  TOURNIQUET: 250 mmHg 14 minutes  DICTATION: .Dragon Dictation   Carpal tunnel release left wrist   Surgeon Aline Brochure  Anesthesia MAC with local  Operative findings compression of the LEFT median nerve yes  Indications failure of conservative treatment to relieve pain and paresthesias and numbness and tingling of the left hand  The patient was identified in the preop area we confirm the surgical site marked as left wrist chart update completed. Patient taken to surgery.  Antibiotic vancomycin 1000 mg after establishing a successful anesthesia with MAC and 1% plain lidocaine the   left arm was prepped with ChloraPrep  Timeout executed completed and confirmed site.  Left wrist  A straight incision was made over the left carpal tunnel in line with the radial border of the ring finger. Blunt dissection was carried out to find the distal aspect of the carpal tunnel. A blunted instrument was passed beneath the carpal tunnel. Sharp incision was then used to release the transverse carpal ligament. The contents of the carpal tunnel were inspected. The median nerve was mildly compressed had good color fairly normal shape  The wound was irrigated and then closed with 3-0 nylon suture. We injected 10 mL of plain Marcaine on the radial  side of the incision  A sterile bandage was applied and the tourniquet was released the color of the hand and capillary refill were normal  The patient was taken to the recovery room in stable condition  PLAN OF CARE: Discharge to home after PACU  PATIENT DISPOSITION:  PACU - hemodynamically stable.   Delay start of Pharmacological VTE agent (>24hrs) due to surgical blood loss or risk of bleeding: not applicable

## 2022-04-11 NOTE — Interval H&P Note (Signed)
History and Physical Interval Note:  04/11/2022 10:35 AM  [name]  has presented today for surgery, with the diagnosis of left carpal tunnel syndrome.  The various methods of treatment have been discussed with the patient and family. After consideration of risks, benefits and other options for treatment, the patient has consented to  Procedure(s): CARPAL TUNNEL RELEASE (Left) as a surgical intervention.  The patient's history has been reviewed, patient examined, no change in status, stable for surgery.  I have reviewed the patient's chart and labs.  Questions were answered to the patient's satisfaction.     Arther Abbott

## 2022-04-11 NOTE — Op Note (Signed)
04/11/2022  10:15 AM  PATIENT:  [name]  71 y.o. female  PRE-OPERATIVE DIAGNOSIS: Right carpal tunnel syndrome  POST-OPERATIVE DIAGNOSIS: Right carpal tunnel syndrome PROCEDURE:  Procedure(s): CARPAL TUNNEL RELEASE right   SURGEON:  Surgeon(s) and Role:    Carole Civil, MD - Primary  Findings  Severe compression of the median nerve.  The portion of the nerve underneath the transverse carpal ligament was bruised flat and purple  Proximal to the compression the nerve appeared to be bulbous  The carpal tunnel contents were free of any lesions  PHYSICIAN ASSISTANT: no  ASSISTANTS: none   ANESTHESIA:   regional  BLOOD ADMINISTERED:none  DRAINS: none   LOCAL MEDICATIONS USED:  MARCAINE     SPECIMEN:  No Specimen  DISPOSITION OF SPECIMEN:  N/A  COUNTS:  YES  Indications failure of conservative treatment to relieve pain and paresthesias and numbness and tingling of the RIGHT HAND   The patient was identified in the preop area we confirm the surgical site marked as right wrist. Chart update completed. Patient taken to surgery.  Antibiotic Ancef 2 g after establishing a successful MAC anesthesia and supplementing with 1% plain Marcaine  The arm was prepped with ChloraPrep  Timeout executed completed and confirmed site. RIGHT WRIST  /  HAND   A straight incision was made over the RIGHT carpal tunnel in line with the radial border of the ring finger. Blunt dissection was carried out to find the distal aspect of the carpal tunnel. A blunted judgment was passed beneath the carpal tunnel. Sharp incision was then used to release the transverse carpal ligament. The contents of the carpal tunnel were inspected.  The nerve was severely damaged with discoloration.  It was flat.  There was a bulbous tissue prior to the area of compression.  The wound was irrigated and then closed with 3-0 nylon suture. We injected 10 mL of plain Marcaine on the radial side of the  incision  A sterile bandage was applied and the tourniquet was released the color of the hand and capillary refill were normal  The patient was taken to the recovery room in stable condition  PLAN OF CARE: Discharge to home after PACU  PATIENT DISPOSITION:  PACU - hemodynamically stable.   Delay start of Pharmacological VTE agent (>24hrs) due to surgical blood loss or risk of bleeding: not applicable   PHYSICIAN ASSISTANT:   ASSISTANTS: none   ANESTHESIA:   MAC  EBL:  none   BLOOD ADMINISTERED:none  DRAINS: none   LOCAL MEDICATIONS USED:  MARCAINE     SPECIMEN:  No Specimen  DISPOSITION OF SPECIMEN:  N/A  COUNTS:  YES  TOURNIQUET:  21 min at 260m  DICTATION: .Dragon Dictation  PLAN OF CARE: Discharge to home after PACU  PATIENT DISPOSITION:  PACU - hemodynamically stable.   Delay start of Pharmacological VTE agent (>24hrs) due to surgical blood loss or risk of bleeding: not applicable

## 2022-04-11 NOTE — Op Note (Signed)
Date 04/11/2022   PRE-OPERATIVE DIAGNOSIS:  LEFT CARPAL TUNNEL SYNDROME  POST-OPERATIVE DIAGNOSIS:  LEFT CARPAL TUNNEL SYNDROME  PROCEDURE:  Procedure(s): LEFT CARPAL TUNNEL RELEASE (Right)   FINDINGS: Mild compression of the median nerve, good color normal shape no space-occupying lesions  SURGEON:  Surgeon(s) and Role:    * Laiya Wisby E, MD - Primary  PHYSICIAN ASSISTANT:   ASSISTANTS: none   ANESTHESIA:   regional  BLOOD ADMINISTERED:none  DRAINS: none   LOCAL MEDICATIONS USED: 1% plain lidocaine preop, half percent plain lidocaine postop 10 cc each for total of 20 cc  SPECIMEN:  No Specimen  DISPOSITION OF SPECIMEN:  N/A  COUNTS:  YES  TOURNIQUET: 250 mmHg 14 minutes  DICTATION: .Dragon Dictation   Carpal tunnel release left wrist   Surgeon Jerzee Jerome  Anesthesia MAC with local  Operative findings compression of the LEFT median nerve yes  Indications failure of conservative treatment to relieve pain and paresthesias and numbness and tingling of the left hand  The patient was identified in the preop area we confirm the surgical site marked as left wrist chart update completed. Patient taken to surgery.  Antibiotic vancomycin 1000 mg after establishing a successful anesthesia with MAC and 1% plain lidocaine the   left arm was prepped with ChloraPrep  Timeout executed completed and confirmed site.  Left wrist  A straight incision was made over the left carpal tunnel in line with the radial border of the ring finger. Blunt dissection was carried out to find the distal aspect of the carpal tunnel. A blunted instrument was passed beneath the carpal tunnel. Sharp incision was then used to release the transverse carpal ligament. The contents of the carpal tunnel were inspected. The median nerve was mildly compressed had good color fairly normal shape  The wound was irrigated and then closed with 3-0 nylon suture. We injected 10 mL of plain Marcaine on the radial  side of the incision  A sterile bandage was applied and the tourniquet was released the color of the hand and capillary refill were normal  The patient was taken to the recovery room in stable condition  PLAN OF CARE: Discharge to home after PACU  PATIENT DISPOSITION:  PACU - hemodynamically stable.   Delay start of Pharmacological VTE agent (>24hrs) due to surgical blood loss or risk of bleeding: not applicable  

## 2022-04-13 NOTE — Op Note (Signed)
Please disregard all prior notes as there is a typo  Please consider this the official operative report  Date 04/11/2022     PRE-OPERATIVE DIAGNOSIS:  LEFT CARPAL TUNNEL SYNDROME   POST-OPERATIVE DIAGNOSIS:  LEFT CARPAL TUNNEL SYNDROME   PROCEDURE:  Procedure(s): LEFT CARPAL TUNNEL RELEASE left    FINDINGS: Mild compression of the median nerve, good color normal shape no space-occupying lesions   SURGEON:  Surgeon(s) and Role:    Carole Civil, MD - Primary   PHYSICIAN ASSISTANT:    ASSISTANTS: none    ANESTHESIA:   regional   BLOOD ADMINISTERED:none   DRAINS: none    LOCAL MEDICATIONS USED: 1% plain lidocaine preop, half percent plain lidocaine postop 10 cc each for total of 20 cc   SPECIMEN:  No Specimen   DISPOSITION OF SPECIMEN:  N/A   COUNTS:  YES   TOURNIQUET: 250 mmHg 14 minutes   DICTATION: .Dragon Dictation    Carpal tunnel release left wrist    Surgeon Aline Brochure   Anesthesia MAC with local   Operative findings compression of the LEFT median nerve yes   Indications failure of conservative treatment to relieve pain and paresthesias and numbness and tingling of the left hand   The patient was identified in the preop area we confirm the surgical site marked as left wrist chart update completed. Patient taken to surgery.  Antibiotic vancomycin 1000 mg after establishing a successful anesthesia with MAC and 1% plain lidocaine the   left arm was prepped with ChloraPrep   Timeout executed completed and confirmed site.  Left wrist   A straight incision was made over the left carpal tunnel in line with the radial border of the ring finger. Blunt dissection was carried out to find the distal aspect of the carpal tunnel. A blunted instrument was passed beneath the carpal tunnel. Sharp incision was then used to release the transverse carpal ligament. The contents of the carpal tunnel were inspected. The median nerve was mildly compressed had good color  fairly normal shape   The wound was irrigated and then closed with 3-0 nylon suture. We injected 10 mL of plain Marcaine on the radial side of the incision   A sterile bandage was applied and the tourniquet was released the color of the hand and capillary refill were normal   The patient was taken to the recovery room in stable condition   PLAN OF CARE: Discharge to home after PACU   PATIENT DISPOSITION:  PACU - hemodynamically stable.   Delay start of Pharmacological VTE agent (>24hrs) due to surgical blood loss or risk of bleeding: not applicable

## 2022-04-18 ENCOUNTER — Encounter (HOSPITAL_COMMUNITY): Payer: Self-pay | Admitting: Orthopedic Surgery

## 2022-04-19 ENCOUNTER — Encounter: Payer: Medicare Other | Admitting: Orthopedic Surgery

## 2022-04-26 ENCOUNTER — Ambulatory Visit (INDEPENDENT_AMBULATORY_CARE_PROVIDER_SITE_OTHER): Payer: Medicare Other | Admitting: Orthopedic Surgery

## 2022-04-26 ENCOUNTER — Encounter: Payer: Self-pay | Admitting: Orthopedic Surgery

## 2022-04-26 DIAGNOSIS — G5602 Carpal tunnel syndrome, left upper limb: Secondary | ICD-10-CM

## 2022-04-26 NOTE — Progress Notes (Signed)
Chief Complaint  Patient presents with   Post-op Follow-up    Carpal tunnel release 04/11/22   Pov 1   Doing well  She has some mild erythema around the wound but seems fine   I told her to watch it and call me if it gets worse  She says that the thumb is still numb but the others feel good  Her ROM is normal   Ret 5 weeks  

## 2022-06-01 ENCOUNTER — Ambulatory Visit (INDEPENDENT_AMBULATORY_CARE_PROVIDER_SITE_OTHER): Payer: Medicare Other | Admitting: Orthopedic Surgery

## 2022-06-01 ENCOUNTER — Encounter: Payer: Self-pay | Admitting: Orthopedic Surgery

## 2022-06-01 DIAGNOSIS — R030 Elevated blood-pressure reading, without diagnosis of hypertension: Secondary | ICD-10-CM | POA: Diagnosis not present

## 2022-06-01 DIAGNOSIS — Z23 Encounter for immunization: Secondary | ICD-10-CM | POA: Diagnosis not present

## 2022-06-01 DIAGNOSIS — Z6833 Body mass index (BMI) 33.0-33.9, adult: Secondary | ICD-10-CM | POA: Diagnosis not present

## 2022-06-01 DIAGNOSIS — Z1389 Encounter for screening for other disorder: Secondary | ICD-10-CM | POA: Diagnosis not present

## 2022-06-01 DIAGNOSIS — G5602 Carpal tunnel syndrome, left upper limb: Secondary | ICD-10-CM

## 2022-06-01 DIAGNOSIS — F419 Anxiety disorder, unspecified: Secondary | ICD-10-CM | POA: Diagnosis not present

## 2022-06-01 DIAGNOSIS — F332 Major depressive disorder, recurrent severe without psychotic features: Secondary | ICD-10-CM | POA: Diagnosis not present

## 2022-06-01 DIAGNOSIS — Z1331 Encounter for screening for depression: Secondary | ICD-10-CM | POA: Diagnosis not present

## 2022-06-01 NOTE — Progress Notes (Signed)
Post op   Chief Complaint  Patient presents with   Post-op Follow-up    Left CTR 04/11/22 improving but has pain swelling back of hand    52 days after surgery  She has good relief of her numbness except for the thumb and she also complains of some swelling in the metacarpophalangeal joints which seems to come and go is relieved by ice  She has some tenderness over the A1 pulley of the long finger as well  She may be developing a trigger finger but right now it is stable  She will continue ice she can use Epsom salt warm soaks as needed  I suspect this is synovitis  Follow-up as needed

## 2022-08-21 ENCOUNTER — Observation Stay (HOSPITAL_COMMUNITY): Payer: Medicare Other

## 2022-08-21 ENCOUNTER — Emergency Department (HOSPITAL_COMMUNITY): Payer: Medicare Other

## 2022-08-21 ENCOUNTER — Observation Stay (HOSPITAL_COMMUNITY)
Admission: EM | Admit: 2022-08-21 | Discharge: 2022-08-22 | Disposition: A | Discharge status: Home / Self Care | Payer: Medicare Other | Attending: Family Medicine | Admitting: Family Medicine

## 2022-08-21 ENCOUNTER — Other Ambulatory Visit: Payer: Self-pay

## 2022-08-21 ENCOUNTER — Encounter (HOSPITAL_COMMUNITY): Payer: Self-pay | Admitting: *Deleted

## 2022-08-21 DIAGNOSIS — Z79899 Other long term (current) drug therapy: Secondary | ICD-10-CM | POA: Diagnosis not present

## 2022-08-21 DIAGNOSIS — R29810 Facial weakness: Secondary | ICD-10-CM | POA: Diagnosis not present

## 2022-08-21 DIAGNOSIS — I10 Essential (primary) hypertension: Secondary | ICD-10-CM | POA: Insufficient documentation

## 2022-08-21 DIAGNOSIS — Z7982 Long term (current) use of aspirin: Secondary | ICD-10-CM | POA: Diagnosis not present

## 2022-08-21 DIAGNOSIS — E236 Other disorders of pituitary gland: Secondary | ICD-10-CM | POA: Diagnosis not present

## 2022-08-21 DIAGNOSIS — Z96651 Presence of right artificial knee joint: Secondary | ICD-10-CM | POA: Insufficient documentation

## 2022-08-21 DIAGNOSIS — Z1152 Encounter for screening for COVID-19: Secondary | ICD-10-CM | POA: Diagnosis not present

## 2022-08-21 DIAGNOSIS — F32A Depression, unspecified: Secondary | ICD-10-CM

## 2022-08-21 DIAGNOSIS — Z87891 Personal history of nicotine dependence: Secondary | ICD-10-CM | POA: Insufficient documentation

## 2022-08-21 DIAGNOSIS — R519 Headache, unspecified: Secondary | ICD-10-CM | POA: Insufficient documentation

## 2022-08-21 DIAGNOSIS — Z96643 Presence of artificial hip joint, bilateral: Secondary | ICD-10-CM | POA: Insufficient documentation

## 2022-08-21 DIAGNOSIS — I2510 Atherosclerotic heart disease of native coronary artery without angina pectoris: Secondary | ICD-10-CM | POA: Diagnosis not present

## 2022-08-21 DIAGNOSIS — M797 Fibromyalgia: Secondary | ICD-10-CM | POA: Diagnosis not present

## 2022-08-21 DIAGNOSIS — R29818 Other symptoms and signs involving the nervous system: Secondary | ICD-10-CM | POA: Diagnosis not present

## 2022-08-21 DIAGNOSIS — G459 Transient cerebral ischemic attack, unspecified: Principal | ICD-10-CM | POA: Diagnosis present

## 2022-08-21 DIAGNOSIS — Z9861 Coronary angioplasty status: Secondary | ICD-10-CM | POA: Diagnosis not present

## 2022-08-21 DIAGNOSIS — F419 Anxiety disorder, unspecified: Secondary | ICD-10-CM | POA: Insufficient documentation

## 2022-08-21 DIAGNOSIS — E782 Mixed hyperlipidemia: Secondary | ICD-10-CM | POA: Insufficient documentation

## 2022-08-21 DIAGNOSIS — E669 Obesity, unspecified: Secondary | ICD-10-CM

## 2022-08-21 LAB — RESP PANEL BY RT-PCR (RSV, FLU A&B, COVID)  RVPGX2
Influenza A by PCR: NEGATIVE
Influenza B by PCR: NEGATIVE
Resp Syncytial Virus by PCR: NEGATIVE
SARS Coronavirus 2 by RT PCR: NEGATIVE

## 2022-08-21 LAB — I-STAT CHEM 8, ED
BUN: 11 mg/dL (ref 8–23)
Calcium, Ion: 1.17 mmol/L (ref 1.15–1.40)
Chloride: 100 mmol/L (ref 98–111)
Creatinine, Ser: 1 mg/dL (ref 0.44–1.00)
Glucose, Bld: 112 mg/dL — ABNORMAL HIGH (ref 70–99)
HCT: 39 % (ref 36.0–46.0)
Hemoglobin: 13.3 g/dL (ref 12.0–15.0)
Potassium: 4.2 mmol/L (ref 3.5–5.1)
Sodium: 140 mmol/L (ref 135–145)
TCO2: 31 mmol/L (ref 22–32)

## 2022-08-21 LAB — DIFFERENTIAL
Abs Immature Granulocytes: 0.02 10*3/uL (ref 0.00–0.07)
Basophils Absolute: 0 10*3/uL (ref 0.0–0.1)
Basophils Relative: 0 %
Eosinophils Absolute: 0.2 10*3/uL (ref 0.0–0.5)
Eosinophils Relative: 3 %
Immature Granulocytes: 0 %
Lymphocytes Relative: 30 %
Lymphs Abs: 2.3 10*3/uL (ref 0.7–4.0)
Monocytes Absolute: 0.4 10*3/uL (ref 0.1–1.0)
Monocytes Relative: 5 %
Neutro Abs: 4.8 10*3/uL (ref 1.7–7.7)
Neutrophils Relative %: 62 %

## 2022-08-21 LAB — CBG MONITORING, ED: Glucose-Capillary: 111 mg/dL — ABNORMAL HIGH (ref 70–99)

## 2022-08-21 LAB — LIPID PANEL
Cholesterol: 144 mg/dL (ref 0–200)
HDL: 41 mg/dL (ref >40)
LDL Cholesterol: 61 mg/dL (ref 0–99)
Total CHOL/HDL Ratio: 3.5 RATIO
Triglycerides: 211 mg/dL — ABNORMAL HIGH (ref <150)
VLDL: 42 mg/dL — ABNORMAL HIGH (ref 0–40)

## 2022-08-21 LAB — COMPREHENSIVE METABOLIC PANEL
ALT: 30 U/L (ref 0–44)
AST: 33 U/L (ref 15–41)
Albumin: 3.9 g/dL (ref 3.5–5.0)
Alkaline Phosphatase: 46 U/L (ref 38–126)
Anion gap: 10 (ref 5–15)
BUN: 12 mg/dL (ref 8–23)
CO2: 28 mmol/L (ref 22–32)
Calcium: 9.3 mg/dL (ref 8.9–10.3)
Chloride: 101 mmol/L (ref 98–111)
Creatinine, Ser: 0.91 mg/dL (ref 0.44–1.00)
GFR, Estimated: >60 mL/min (ref >60)
Glucose, Bld: 109 mg/dL — ABNORMAL HIGH (ref 70–99)
Potassium: 4.1 mmol/L (ref 3.5–5.1)
Sodium: 139 mmol/L (ref 135–145)
Total Bilirubin: 0.6 mg/dL (ref 0.3–1.2)
Total Protein: 7.2 g/dL (ref 6.5–8.1)

## 2022-08-21 LAB — CBC
HCT: 38.1 % (ref 36.0–46.0)
Hemoglobin: 12.9 g/dL (ref 12.0–15.0)
MCH: 30.2 pg (ref 26.0–34.0)
MCHC: 33.9 g/dL (ref 30.0–36.0)
MCV: 89.2 fL (ref 80.0–100.0)
Platelets: 349 10*3/uL (ref 150–400)
RBC: 4.27 MIL/uL (ref 3.87–5.11)
RDW: 12.4 % (ref 11.5–15.5)
WBC: 7.7 10*3/uL (ref 4.0–10.5)
nRBC: 0 % (ref 0.0–0.2)

## 2022-08-21 LAB — APTT: aPTT: 24 seconds (ref 24–36)

## 2022-08-21 LAB — ETHANOL: Alcohol, Ethyl (B): <10 mg/dL (ref <10)

## 2022-08-21 LAB — PROTIME-INR
INR: 0.9 (ref 0.8–1.2)
Prothrombin Time: 12.4 seconds (ref 11.4–15.2)

## 2022-08-21 MED ORDER — PANTOPRAZOLE SODIUM 40 MG IV SOLR
40.0000 mg | INTRAVENOUS | Status: DC
  Administered 2022-08-21: 40 mg via INTRAVENOUS
  Filled 2022-08-21: qty 10

## 2022-08-21 MED ORDER — SERTRALINE HCL 50 MG PO TABS
50.0000 mg | ORAL_TABLET | Freq: Every day | ORAL | Status: DC
  Administered 2022-08-22: 50 mg via ORAL
  Filled 2022-08-21: qty 1

## 2022-08-21 MED ORDER — ACETAMINOPHEN 325 MG PO TABS
650.0000 mg | ORAL_TABLET | Freq: Four times a day (QID) | ORAL | Status: DC | PRN
  Administered 2022-08-21: 650 mg via ORAL
  Filled 2022-08-21: qty 2

## 2022-08-21 MED ORDER — ONDANSETRON HCL 4 MG/2ML IJ SOLN: 4.0000 mg | Freq: Four times a day (QID) | INTRAMUSCULAR | Status: DC | PRN

## 2022-08-21 MED ORDER — CLOPIDOGREL BISULFATE 75 MG PO TABS
75.0000 mg | ORAL_TABLET | Freq: Every day | ORAL | Status: DC
  Administered 2022-08-22: 75 mg via ORAL
  Filled 2022-08-21: qty 1

## 2022-08-21 MED ORDER — ASPIRIN 81 MG PO TBEC: 81.0000 mg | DELAYED_RELEASE_TABLET | Freq: Every day | ORAL | Status: DC

## 2022-08-21 MED ORDER — GABAPENTIN 400 MG PO CAPS
800.0000 mg | ORAL_CAPSULE | Freq: Two times a day (BID) | ORAL | Status: DC
  Administered 2022-08-21 – 2022-08-22 (×2): 800 mg via ORAL
  Filled 2022-08-21 (×2): qty 2

## 2022-08-21 MED ORDER — ACETAMINOPHEN 650 MG RE SUPP: 650.0000 mg | Freq: Four times a day (QID) | RECTAL | Status: DC | PRN

## 2022-08-21 MED ORDER — SODIUM CHLORIDE 0.9% FLUSH
3.0000 mL | Freq: Once | INTRAVENOUS | Status: AC
  Administered 2022-08-21: 3 mL via INTRAVENOUS

## 2022-08-21 MED ORDER — ENOXAPARIN SODIUM 40 MG/0.4ML IJ SOSY
40.0000 mg | PREFILLED_SYRINGE | INTRAMUSCULAR | Status: DC
  Administered 2022-08-21: 40 mg via SUBCUTANEOUS
  Filled 2022-08-21: qty 0.4

## 2022-08-21 MED ORDER — ALPRAZOLAM 0.5 MG PO TABS
0.5000 mg | ORAL_TABLET | Freq: Three times a day (TID) | ORAL | Status: DC | PRN
  Administered 2022-08-21 – 2022-08-22 (×2): 0.5 mg via ORAL
  Filled 2022-08-21 (×2): qty 1

## 2022-08-21 MED ORDER — ONDANSETRON HCL 4 MG PO TABS: 4.0000 mg | ORAL_TABLET | Freq: Four times a day (QID) | ORAL | Status: DC | PRN

## 2022-08-21 MED ORDER — ASPIRIN 81 MG PO TBEC
81.0000 mg | DELAYED_RELEASE_TABLET | Freq: Every day | ORAL | Status: DC
  Administered 2022-08-22: 81 mg via ORAL
  Filled 2022-08-21: qty 1

## 2022-08-21 MED ORDER — ATORVASTATIN CALCIUM 10 MG PO TABS
10.0000 mg | ORAL_TABLET | Freq: Every day | ORAL | Status: DC
  Administered 2022-08-21: 10 mg via ORAL
  Filled 2022-08-21: qty 1

## 2022-08-21 NOTE — ED Triage Notes (Addendum)
Pt with left facial droop unknown for how long, c/o right sided HA last night.  Pt noted facial droop at 0930 this morning when she woke up. Felt bad at 2100 when she went to bed. Denies any weakness to left arm/leg.

## 2022-08-21 NOTE — ED Notes (Signed)
Pt refused first CT angio r/t allergy.

## 2022-08-21 NOTE — H&P (Signed)
History and Physical    Patient: [name] DVV:[id number] DOB: February 08, 1951 DOA: 08/21/2022 DOS: the patient was seen and examined on 08/21/2022 PCP: Manon Hilding, MD  Patient coming from: Home  Chief Complaint:  Chief Complaint  Patient presents with   Facial Droop   HPI: [name] is a 72 y.o. female with medical history significant of hypertension, hyperlipidemia, CAD s/p stent placement, anxiety and depression who presents to the emergency department due to left-sided facial droop and right-sided headache noted on waking up this morning.  Patient complained of headache in right occipital area yesterday around 9:30 PM, the headache sensation was described as " bruise sensation".  Ice pack and cold pillows used alleviated the headache, but on waking up this morning, she noted a left-sided facial droop when she looked in the mirror, she also had difficulty in being able to open left eye and she complained of drooling as well.  She complained of intermittent numbness on left side of face, but patient denies any history of fall, stroke or weakness.  She denies chest pain, shortness of breath, nausea, vomiting, abdominal pain.  ED Course:  In the emergency department, BP was 183/64, other vital signs were within normal range.  Workup in the ED showed normal CBC and BMP except for blood glucose of 109.  Alcohol level was less than 10.  Influenza A, B, SARS coronavirus 2, RSV was negative. CT head without contrast showed no acute intracranial abnormality MRI head without contrast showed no evidence of acute intracranial abnormality While in the ED, symptoms resolved and patient was back to baseline, neurologist was consulted and recommended admission for TIA workup.  Review of Systems: Review of systems as noted in the HPI. All other systems reviewed and are negative.   Past Medical History:  Diagnosis Date   Anxiety    Arthritis    Complication of anesthesia    difficult to wake up  after anesthesia   Coronary artery disease    LAD Cypher stent with PCI of the diagonal in 2004   Depression    Dyslipidemia    Essential hypertension    Fibromyalgia    History of migraine    Pre-diabetes    Past Surgical History:  Procedure Laterality Date   ABDOMINAL HYSTERECTOMY     partial first, then full   BACK SURGERY     01-30-2017 [place] Right    CARPAL TUNNEL RELEASE Left 04/11/2022   Procedure: CARPAL TUNNEL RELEASE;  Surgeon: Carole Civil, MD;  Location: [facility];  Service: Orthopedics;  Laterality: Left;   CHOLECYSTECTOMY     COLONOSCOPY     CORONARY ANGIOPLASTY  2004   REPLACEMENT TOTAL KNEE Right    SHOULDER ARTHROSCOPY Right    TOTAL HIP ARTHROPLASTY Left    TOTAL HIP ARTHROPLASTY Right 06/01/2017   Procedure: RIGHT TOTAL HIP ARTHROPLASTY ANTERIOR APPROACH;  Surgeon: Mcarthur Rossetti, MD;  Location: [facility];  Service: Orthopedics;  Laterality: Right;  Needs RNFA   TUBAL LIGATION      Social History:  reports that she quit smoking about 5 years ago. Her smoking use included cigarettes. She has a 33.75 pack-year smoking history. She has never used smokeless tobacco. She reports current alcohol use. She reports that she does not use drugs.   Allergies  Allergen Reactions   Iodine Swelling    Red spots on skin after cath, swollen lips and  eyes   Ambien [Zolpidem] Other (See Comments)    hallucinations   Adhesive [Tape] Dermatitis    Plastic tape causes skin irritation    Niacin Rash    MAY BE PREDICTABLE RESPONSE TO NIACIN   Penicillins Rash    Family History  Problem Relation Age of Onset   Heart attack Father 35   Lung cancer Brother      Prior to Admission medications   Medication Sig Start Date End Date Taking? Authorizing Provider  acetaminophen (TYLENOL) 500 MG tablet Take 1,000 mg by mouth every 6 (six) hours as needed for mild pain or moderate pain.    [provider]   ALPRAZolam Duanne Moron) 0.5 MG tablet Take 0.5 mg by mouth 3 (three) times daily as needed for anxiety.    [provider]  amLODipine (NORVASC) 10 MG tablet Take 10 mg by mouth daily.    [provider]  aspirin EC 81 MG tablet Take 81 mg by mouth daily.    [provider]  atorvastatin (LIPITOR) 10 MG tablet Take 10 mg by mouth at bedtime.    [provider]  cholecalciferol (VITAMIN D) 1000 units tablet Take 1,000 Units by mouth daily.    [provider]  clobetasol (TEMOVATE) 0.05 % external solution Apply 1 application topically 2 (two) times daily. scalp    [provider]  cyclobenzaprine (FLEXERIL) 10 MG tablet Take 10 mg by mouth 3 (three) times daily as needed for muscle spasms.    [provider]  furosemide (LASIX) 40 MG tablet Take 40 mg by mouth daily.    [provider]  gabapentin (NEURONTIN) 400 MG capsule Take 800 mg by mouth 2 (two) times daily.    [provider]  lisinopril-hydrochlorothiazide (PRINZIDE,ZESTORETIC) 10-12.5 MG tablet Take 1 tablet by mouth daily.    [provider]  Multiple Vitamins-Minerals (MULTIVITAMIN WITH MINERALS) tablet Take 1 tablet by mouth daily. Women's One-A-Day    [provider]  nitroGLYCERIN (NITROSTAT) 0.4 MG SL tablet Place 1 tablet (0.4 mg total) under the tongue every 5 (five) minutes x 3 doses as needed for chest pain (if no relief after 3rd dose, proceed to the ED or call 911). 10/22/18 11/11/20  Satira Sark, MD  oxybutynin (DITROPAN) 5 MG tablet  11/08/19   [provider]  oxybutynin (DITROPAN-XL) 5 MG 24 hr tablet Take 5 mg by mouth at bedtime.    [provider]  sertraline (ZOLOFT) 25 MG tablet Take 25 mg by mouth daily. 03/13/22   [provider]  traMADol (ULTRAM) 50 MG tablet One tablet every six hours as needed for pain. 10/28/19   Sanjuana Kava, MD  triamcinolone (KENALOG) 0.1 % paste Use as directed 1  application in the mouth or throat 2 (two) times daily as needed (mouth ulcers).    [provider]    Physical Exam: BP (!) 167/117   Pulse 60   Temp 98.3 F (36.8 C)   Resp 20   Ht '5\' 2"'$  (1.575 m)   Wt 83.5 kg   SpO2 100%   BMI 33.65 kg/m   General: 72 y.o. year-old female well developed well nourished in no acute distress.  Alert and oriented x3. HEENT: NCAT, EOMI Neck: Supple, trachea medial Cardiovascular: Regular rate and rhythm with no rubs or gallops.  No thyromegaly or JVD noted.  No lower extremity edema. 2/4 pulses in all 4 extremities. Respiratory: Clear to auscultation with no wheezes or rales. Good  inspiratory effort. Abdomen: Soft, nontender nondistended with normal bowel sounds x4 quadrants. Muskuloskeletal: No cyanosis, clubbing or edema noted bilaterally Neuro: CN II-XII intact, strength 5/5 x 4, sensation, reflexes intact Skin: No ulcerative lesions noted or rashes Psychiatry: Judgement and insight appear normal. Mood is appropriate for condition and setting          Labs on Admission:  Basic Metabolic Panel: Recent Labs  Lab 08/21/22 1346 08/21/22 1353  NA 139 140  K 4.1 4.2  CL 101 100  CO2 28  --   GLUCOSE 109* 112*  BUN 12 11  CREATININE 0.91 1.00  CALCIUM 9.3  --    Liver Function Tests: Recent Labs  Lab 08/21/22 1346  AST 33  ALT 30  ALKPHOS 46  BILITOT 0.6  PROT 7.2  ALBUMIN 3.9   No results for input(s): "LIPASE", "AMYLASE" in the last 168 hours. No results for input(s): "AMMONIA" in the last 168 hours. CBC: Recent Labs  Lab 08/21/22 1346 08/21/22 1353  WBC 7.7  --   NEUTROABS 4.8  --   HGB 12.9 13.3  HCT 38.1 39.0  MCV 89.2  --   PLT 349  --    Cardiac Enzymes: No results for input(s): "CKTOTAL", "CKMB", "CKMBINDEX", "TROPONINI" in the last 168 hours.  BNP (last 3 results) No results for input(s): "BNP" in the last 8760 hours.  ProBNP (last 3 results) No results for input(s): "PROBNP" in the last 8760  hours.  CBG: Recent Labs  Lab 08/21/22 1352  GLUCAP 111*    Radiological Exams on Admission: MR BRAIN WO CONTRAST  Result Date: 08/21/2022 CLINICAL DATA:  Neuro deficit, acute, stroke suspected EXAM: MRI HEAD WITHOUT CONTRAST TECHNIQUE: Multiplanar, multiecho pulse sequences of the brain and surrounding structures were obtained without intravenous contrast. COMPARISON:  CT head from the same day. FINDINGS: Brain: No acute infarction, hemorrhage, hydrocephalus, extra-axial collection or mass lesion. Vascular: Major arterial flow voids are maintained skull base. Skull and upper cervical spine: Normal marrow signal. Sinuses/Orbits: Clear sinuses.  No acute orbital findings. Other: No mastoid effusions. IMPRESSION: No evidence of acute intracranial abnormality. Electronically Signed   By: Margaretha Sheffield M.D.   On: 08/21/2022 17:09   CT HEAD WO CONTRAST  Result Date: 08/21/2022 CLINICAL DATA:  Stroke suspected EXAM: CT HEAD WITHOUT CONTRAST TECHNIQUE: Contiguous axial images were obtained from the base of the skull through the vertex without intravenous contrast. RADIATION DOSE REDUCTION: This exam was performed according to the departmental dose-optimization program which includes automated exposure control, adjustment of the mA and/or kV according to patient size and/or use of iterative reconstruction technique. COMPARISON:  None Available. FINDINGS: Brain: No evidence of acute infarction, hemorrhage, hydrocephalus, extra-axial collection or mass lesion/mass effect. Partially empty sella. Vascular: No hyperdense vessel or unexpected calcification. Skull: Normal. Negative for fracture or focal lesion. Sinuses/Orbits: No mastoid effusion. Paranasal sinuses are clear. Orbits are unremarkable. Other: None. IMPRESSION: No acute intracranial abnormality. Electronically Signed   By: Marin Roberts M.D.   On: 08/21/2022 14:04    EKG: I independently viewed the EKG done and my findings are as followed:  Normal sinus rhythm at a rate of 66 bpm  Assessment/Plan Present on Admission:  TIA (transient ischemic attack)  Principal Problem:   TIA (transient ischemic attack) Active Problems:   Facial droop   Headache   Essential hypertension   Mixed hyperlipidemia   Fibromyalgia   CAD S/P percutaneous coronary angioplasty   Anxiety and depression  Left-sided facial droop and  ptosis rule out TIA Patient will be admitted to telemetry unit  CT head without contrast showed no acute intracranial abnormality MRI head without contrast showed no evidence of acute intracranial abnormality Echocardiogram in the morning CT angiography of head and neck will be held at this time due to prior allergy to contrast Continue aspirin, Plavix and statin Continue Protonix for GI prophylaxis Continue fall precautions and neuro checks Lipid panel and hemoglobin A1c will be checked Continue PT/OT eval and treat Bedside swallow eval by nursing prior to diet Consider SLP in the morning if patient fails bedside swallow eval Neurology was already consulted by ED PA and we shall await further recommendations.    Headache Continue Tylenol as needed  Essential hypertension  Antihypertensives PRN if Blood pressure is greater than 220/120 or there is a concern for End organ damage/contraindications for permissive HTN. If blood pressure is greater than 220/120 give labetalol PO or IV or Vasotec IV with a goal of 15% reduction in BP during the first 24 hours.  Mixed hyperlipidemia Continue Lipitor  Fibromyalgia Continue gabapentin  CAD s/p stent placement Continue aspirin and Lipitor  Anxiety and depression Continue Xanax and Zoloft  Obesity (BMI 33.65) Diet and ophthalmology patient   DVT prophylaxis: Lovenox  Code Status: Full code  Consults: Neurology by ED PA  Family Communication: None at bedside  Severity of Illness: The appropriate patient status for this patient is OBSERVATION.  Observation status is judged to be reasonable and necessary in order to provide the required intensity of service to ensure the patient's safety. The patient's presenting symptoms, physical exam findings, and initial radiographic and laboratory data in the context of their medical condition is felt to place them at decreased risk for further clinical deterioration. Furthermore, it is anticipated that the patient will be medically stable for discharge from the hospital within 2 midnights of admission.   Author: Bernadette Hoit, DO 08/21/2022 10:29 PM  For on call review [email or web address].

## 2022-08-21 NOTE — Consult Note (Signed)
[facility] Telemedicine Consult   Requesting Provider: Ms. Humberto Leep  Chief Complaint: left ptosis and left facial droop  HPI: 72 yo F with hx of HTN, CAD, HLD, CTS presented to ER for above symptoms. Per pt and EDPA, pt had some HA last night at the right back of head This morning, she woke up and found to have left facial droop and left ptosis. She came to ED for evaluation. Denies any speech difficulty, arm or leg weakness. Denies hx of stroke in the past. CT no acute finding. While waiting for further test, her symptoms all resolved in ER. Neurology consulted for evaluation.   LKW: last night 9:30pm tpa given?: No, outside window, symptoms resolved IR Thrombectomy? No, no LVO sign, symptoms resolved Modified Rankin Scale: 0-Completely asymptomatic and back to baseline post- stroke   Exam: Vitals:   08/21/22 2045 08/21/22 2100  BP:  (!) 168/54  Pulse:    Resp: 16 (!) 21  Temp:    SpO2:       Temp:  [98 F (36.7 C)-98.2 F (36.8 C)] 98.2 F (36.8 C) (01/29 1753) Pulse Rate:  [56-68] 61 (01/29 1800) Resp:  [15-22] 21 (01/29 2100) BP: (110-183)/(54-89) 168/54 (01/29 2100) SpO2:  [94 %-99 %] 97 % (01/29 2011) FiO2 (%):  [21 %] 21 % (01/29 2011) Weight:  [83.5 kg] 83.5 kg (01/29 1321)  General - Well nourished, well developed, in no apparent distress.  Ophthalmologic - fundi not visualized due to noncooperation.  Cardiovascular - Regular rhythm and rate.  Neuro - awake, alert, eyes open, orientated to age, place, time and people. No aphasia, fluent language, following all simple commands. Able to name and repeat and read. No gaze palsy, tracking bilaterally, visual field full, PERRL. No facial droop. Tongue midline. Bilateral UEs 5/5, no drift. Bilaterally LEs 5/5, no drift. Sensation symmetrical bilaterally, b/l FTN intact, gait not tested.   NIH Stroke Scale = 0   Imaging Reviewed:  MR BRAIN WO CONTRAST  Result Date: 08/21/2022 CLINICAL DATA:  Neuro  deficit, acute, stroke suspected EXAM: MRI HEAD WITHOUT CONTRAST TECHNIQUE: Multiplanar, multiecho pulse sequences of the brain and surrounding structures were obtained without intravenous contrast. COMPARISON:  CT head from the same day. FINDINGS: Brain: No acute infarction, hemorrhage, hydrocephalus, extra-axial collection or mass lesion. Vascular: Major arterial flow voids are maintained skull base. Skull and upper cervical spine: Normal marrow signal. Sinuses/Orbits: Clear sinuses.  No acute orbital findings. Other: No mastoid effusions. IMPRESSION: No evidence of acute intracranial abnormality. Electronically Signed   By: Margaretha Sheffield M.D.   On: 08/21/2022 17:09   CT HEAD WO CONTRAST  Result Date: 08/21/2022 CLINICAL DATA:  Stroke suspected EXAM: CT HEAD WITHOUT CONTRAST TECHNIQUE: Contiguous axial images were obtained from the base of the skull through the vertex without intravenous contrast. RADIATION DOSE REDUCTION: This exam was performed according to the departmental dose-optimization program which includes automated exposure control, adjustment of the mA and/or kV according to patient size and/or use of iterative reconstruction technique. COMPARISON:  None Available. FINDINGS: Brain: No evidence of acute infarction, hemorrhage, hydrocephalus, extra-axial collection or mass lesion/mass effect. Partially empty sella. Vascular: No hyperdense vessel or unexpected calcification. Skull: Normal. Negative for fracture or focal lesion. Sinuses/Orbits: No mastoid effusion. Paranasal sinuses are clear. Orbits are unremarkable. Other: None. IMPRESSION: No acute intracranial abnormality. Electronically Signed   By: Marin Roberts M.D.   On: 08/21/2022 14:04     Labs reviewed in epic and pertinent values follow: Cre 1.00, platelet  349  Assessment:  72 yo F with hx of HTN, CAD, HLD, CTS presented to ER for HA and left facial droop and left ptosis. CT no acute finding. While waiting for further test, her  symptoms all resolved in ER. MRI no acute infarct. Etiology not quite sure, but will finish off TIA work up. Also check ESR CRP to rule out TA.    Recommendations:  Continue further TIA work up  Frequent neuro checks Telemetry monitoring CTA head and neck Echocardiogram  Fasting lipid panel and HgbA1C, ESR and CPR GI and DVT prophylaxis  ASA and plavix DAPT and statin  Stroke risk factor modification Discussed with Ms. Humberto Leep ED PA We will follow    Consult Participants: me, pt, RN Location of the provider: [facility], [place] Location of the patient: [facility]   This consult was provided via telemedicine with 2-way video and audio communication. The patient/family was informed that care would be provided in this way and agreed to receive care in this manner.   This patient is receiving care for possible acute neurological changes. There was 50 minutes of care by this provider at the time of service, including time for direct evaluation via telemedicine, review of medical records, imaging studies and discussion of findings with providers, the patient and/or family.  Rosalin Hawking, MD PhD Stroke Neurology 08/21/2022 9:36 PM

## 2022-08-21 NOTE — ED Provider Notes (Signed)
[facility] Provider Note   CSN: [id number] Arrival date & time: 08/21/22  1307     History  Chief Complaint  Patient presents with   Facial Droop    [name] is a 72 y.o. female with a past medical history of CAD presenting today due to a right-sided headache and left-sided facial droop.  Reports that she was feeling a lot last night and started have a headache on the posterior right side around 9:30 PM.  She said that it felt like "it was bruised."  Says that it was somewhat helped by ice packs and cold pillows.  This morning she woke up and looked in the mirror and noted that she had a left-sided facial droop.  She was unable to open her left eye as well.  Noted that liquids were coming out of her mouth after she tried to drink.  Denies any history of CVA.  No weakness.  No falls.  Does endorse some intermittent numbness on the left side of her face.  This is never happened before.    HPI     Home Medications Prior to Admission medications   Medication Sig Start Date End Date Taking? Authorizing Provider  acetaminophen (TYLENOL) 500 MG tablet Take 1,000 mg by mouth every 6 (six) hours as needed for mild pain or moderate pain.    [provider]  ALPRAZolam Duanne Moron) 0.5 MG tablet Take 0.5 mg by mouth 3 (three) times daily as needed for anxiety.    [provider]  amLODipine (NORVASC) 10 MG tablet Take 10 mg by mouth daily.    [provider]  aspirin EC 81 MG tablet Take 81 mg by mouth daily.    [provider]  atorvastatin (LIPITOR) 10 MG tablet Take 10 mg by mouth at bedtime.    [provider]  cholecalciferol (VITAMIN D) 1000 units tablet Take 1,000 Units by mouth daily.    [provider]  clobetasol (TEMOVATE) 0.05 % external solution Apply 1 application topically 2 (two) times daily. scalp    [provider]  cyclobenzaprine (FLEXERIL) 10 MG tablet Take 10 mg by  mouth 3 (three) times daily as needed for muscle spasms.    [provider]  furosemide (LASIX) 40 MG tablet Take 40 mg by mouth daily.    [provider]  gabapentin (NEURONTIN) 400 MG capsule Take 800 mg by mouth 2 (two) times daily.    [provider]  lisinopril-hydrochlorothiazide (PRINZIDE,ZESTORETIC) 10-12.5 MG tablet Take 1 tablet by mouth daily.    [provider]  Multiple Vitamins-Minerals (MULTIVITAMIN WITH MINERALS) tablet Take 1 tablet by mouth daily. Women's One-A-Day    [provider]  nitroGLYCERIN (NITROSTAT) 0.4 MG SL tablet Place 1 tablet (0.4 mg total) under the tongue every 5 (five) minutes x 3 doses as needed for chest pain (if no relief after 3rd dose, proceed to the ED or call 911). 10/22/18 11/11/20  Satira Sark, MD  oxybutynin (DITROPAN) 5 MG tablet  11/08/19   [provider]  oxybutynin (DITROPAN-XL) 5 MG 24 hr tablet Take 5 mg by mouth at bedtime.    [provider]  sertraline (ZOLOFT) 25 MG tablet Take 25 mg by mouth daily. 03/13/22   [provider]  traMADol (ULTRAM) 50 MG tablet One tablet every six hours as needed for pain. 10/28/19   Sanjuana Kava, MD  triamcinolone (KENALOG) 0.1 % paste Use as directed 1  application in the mouth or throat 2 (two) times daily as needed (mouth ulcers).    [provider]      Allergies    Iodine, Ambien [zolpidem], Adhesive [tape], Niacin, and Penicillins    Review of Systems   Review of Systems  Physical Exam Updated Vital Signs BP (!) 145/66   Pulse (!) 58   Temp 98 F (36.7 C) (Oral)   Resp 15   Ht '5\' 2"'$  (1.575 m)   Wt 83.5 kg   SpO2 94%   BMI 33.65 kg/m  Physical Exam Vitals and nursing note reviewed.  Constitutional:      Appearance: Normal appearance.  HENT:     Head: Normocephalic and atraumatic.     Right Ear: Tympanic membrane normal.     Left Ear: Tympanic membrane normal.     Mouth/Throat:     Mouth: Mucous  membranes are moist.     Pharynx: Oropharynx is clear.     Comments: Right-sided tongue deviation Eyes:     General: No scleral icterus.    Conjunctiva/sclera: Conjunctivae normal.  Pulmonary:     Effort: Pulmonary effort is normal. No respiratory distress.  Skin:    Findings: No rash.  Neurological:     Mental Status: She is alert.     Comments: Very apparent left-sided facial droop.  Patient unable to actively open her left eye.  When I manually open this her pupil is reactive.  No signs of trauma.  Right-sided EOMs intact.  Normal strength in bilateral upper and lower extremities.  Notes a sensation deficit on her face however is unable to describe it.  Ambulatory throughout the department without assistance.  Psychiatric:        Mood and Affect: Mood normal.     ED Results / Procedures / Treatments   Labs (all labs ordered are listed, but only abnormal results are displayed) Labs Reviewed  I-STAT CHEM 8, ED - Abnormal; Notable for the following components:      Result Value   Glucose, Bld 112 (*)    All other components within normal limits  CBG MONITORING, ED - Abnormal; Notable for the following components:   Glucose-Capillary 111 (*)    All other components within normal limits  RESP PANEL BY RT-PCR (RSV, FLU A&B, COVID)  RVPGX2  PROTIME-INR  APTT  CBC  DIFFERENTIAL  ETHANOL  COMPREHENSIVE METABOLIC PANEL    EKG None  Radiology CT HEAD WO CONTRAST  Result Date: 08/21/2022 CLINICAL DATA:  Stroke suspected EXAM: CT HEAD WITHOUT CONTRAST TECHNIQUE: Contiguous axial images were obtained from the base of the skull through the vertex without intravenous contrast. RADIATION DOSE REDUCTION: This exam was performed according to the departmental dose-optimization program which includes automated exposure control, adjustment of the mA and/or kV according to patient size and/or use of iterative reconstruction technique. COMPARISON:  None Available. FINDINGS: Brain: No evidence  of acute infarction, hemorrhage, hydrocephalus, extra-axial collection or mass lesion/mass effect. Partially empty sella. Vascular: No hyperdense vessel or unexpected calcification. Skull: Normal. Negative for fracture or focal lesion. Sinuses/Orbits: No mastoid effusion. Paranasal sinuses are clear. Orbits are unremarkable. Other: None. IMPRESSION: No acute intracranial abnormality. Electronically Signed   By: Marin Roberts M.D.   On: 08/21/2022 14:04    Procedures Procedures   Medications Ordered in ED Medications  sodium chloride flush (NS) 0.9 % injection 3 mL (3 mLs Intravenous Given 08/21/22 1349)    ED Course/ Medical Decision Making/ A&P Clinical Course as of  08/21/22 1708  Mon Aug 21, 2022  1509 EKG 12-Lead [AH]  1542 Patient reassessed at that side and no longer has a facial droop.  Also now able to open her left eye.  EOMs fully intact.  Reports feeling better and no longer has numbness [MR]  47 Spoke with Dr. Erlinda Hong, who says that the patient's MRI looks fine and he would like her to be admitted for further TIA workup.  Likely will be discharged tomorrow after observation [MR]    Clinical Course User Index [AH] Rica Koyanagi [MR] Danyel Griess, Cecilio Asper, PA-C                             Medical Decision Making Amount and/or Complexity of Data Reviewed Labs: ordered. Radiology: ordered.  Risk Decision regarding hospitalization.   72 year old female presenting today due to facial droop and difficulty opening her left eye.  Differential includes but is not limited to CVA/TIA, brain lesion, hemorrhage, atypical migraine, Bell's palsy.   this is not an exhaustive differential.    Past Medical History / Co-morbidities / Social History: CAD, hypertension, hyperlipidemia    Physical Exam: Pertinent physical exam findings include Normal neuroexam outside of patient's left-sided facial droop and ptsosis and right-sided tongue deviation.  All of these have since  resolved  Lab Tests: I ordered, and personally interpreted labs.  The pertinent results include: Unremarkable labs   Imaging Studies: I ordered and independently visualized and interpreted CT and MRI and I agree with the radiologist that there are no acute findings   Medications: Declined medications  Consultations Obtained: I spoke with Dr. Erlinda Hong with allergy and they recommended admission for TIA workup.  MDM/Disposition: This is a 72 year old female who presented today with left-sided facial droop and ptosis.  Last known well early yesterday evening.  Strangely, her symptoms and signs resolved while in the emergency department without any medications.  CT and MRI were both negative.  Neurologist, Dr. Erlinda Hong request admission for TIA workup.  Question brainstem lesion due to patient's presentation.  She is agreeable to this.  Will admit to Dr. Josephine Cables.     I discussed this case with my attending physician Dr. Roderic Palau who cosigned this note including patient's presenting symptoms, physical exam, and planned diagnostics and interventions. Attending physician stated agreement with plan or made changes to plan which were implemented.      Final Clinical Impression(s) / ED Diagnoses Final diagnoses:  Facial droop    Rx / DC Orders ED Discharge Orders     None      Admit to Dr. Edrick Kins, [place] A, PA-C 08/21/22 Lezlie Octave, MD 08/22/22 1043

## 2022-08-22 ENCOUNTER — Observation Stay (HOSPITAL_BASED_OUTPATIENT_CLINIC_OR_DEPARTMENT_OTHER): Payer: Medicare Other

## 2022-08-22 ENCOUNTER — Telehealth: Payer: Self-pay | Admitting: *Deleted

## 2022-08-22 DIAGNOSIS — G459 Transient cerebral ischemic attack, unspecified: Secondary | ICD-10-CM

## 2022-08-22 DIAGNOSIS — M47812 Spondylosis without myelopathy or radiculopathy, cervical region: Secondary | ICD-10-CM | POA: Diagnosis not present

## 2022-08-22 DIAGNOSIS — I672 Cerebral atherosclerosis: Secondary | ICD-10-CM | POA: Diagnosis not present

## 2022-08-22 DIAGNOSIS — H02402 Unspecified ptosis of left eyelid: Secondary | ICD-10-CM | POA: Diagnosis not present

## 2022-08-22 DIAGNOSIS — I6523 Occlusion and stenosis of bilateral carotid arteries: Secondary | ICD-10-CM | POA: Diagnosis not present

## 2022-08-22 DIAGNOSIS — R29810 Facial weakness: Secondary | ICD-10-CM | POA: Diagnosis not present

## 2022-08-22 DIAGNOSIS — I6503 Occlusion and stenosis of bilateral vertebral arteries: Secondary | ICD-10-CM | POA: Diagnosis not present

## 2022-08-22 LAB — SEDIMENTATION RATE: Sed Rate: 21 mm/hr (ref 0–22)

## 2022-08-22 LAB — CBC
HCT: 38.3 % (ref 36.0–46.0)
Hemoglobin: 12.8 g/dL (ref 12.0–15.0)
MCH: 29.6 pg (ref 26.0–34.0)
MCHC: 33.4 g/dL (ref 30.0–36.0)
MCV: 88.7 fL (ref 80.0–100.0)
Platelets: 351 10*3/uL (ref 150–400)
RBC: 4.32 MIL/uL (ref 3.87–5.11)
RDW: 12.3 % (ref 11.5–15.5)
WBC: 8.8 10*3/uL (ref 4.0–10.5)
nRBC: 0 % (ref 0.0–0.2)

## 2022-08-22 LAB — HEMOGLOBIN A1C
Hgb A1c MFr Bld: 5.3 % (ref 4.8–5.6)
Mean Plasma Glucose: 105.41 mg/dL

## 2022-08-22 LAB — COMPREHENSIVE METABOLIC PANEL
ALT: 27 U/L (ref 0–44)
AST: 34 U/L (ref 15–41)
Albumin: 3.8 g/dL (ref 3.5–5.0)
Alkaline Phosphatase: 48 U/L (ref 38–126)
Anion gap: 10 (ref 5–15)
BUN: 13 mg/dL (ref 8–23)
CO2: 29 mmol/L (ref 22–32)
Calcium: 9.1 mg/dL (ref 8.9–10.3)
Chloride: 99 mmol/L (ref 98–111)
Creatinine, Ser: 0.89 mg/dL (ref 0.44–1.00)
GFR, Estimated: >60 mL/min (ref >60)
Glucose, Bld: 117 mg/dL — ABNORMAL HIGH (ref 70–99)
Potassium: 3.5 mmol/L (ref 3.5–5.1)
Sodium: 138 mmol/L (ref 135–145)
Total Bilirubin: 0.6 mg/dL (ref 0.3–1.2)
Total Protein: 6.9 g/dL (ref 6.5–8.1)

## 2022-08-22 LAB — ECHOCARDIOGRAM COMPLETE
AR max vel: 2.39 cm2
AV Area VTI: 2.37 cm2
AV Area mean vel: 2.32 cm2
AV Mean grad: 6 mmHg
AV Peak grad: 12.8 mmHg
Ao pk vel: 1.79 m/s
Area-P 1/2: 2.91 cm2
Est EF: >75
Height: 62 in
S' Lateral: 2 cm
Weight: 2892.44 oz

## 2022-08-22 LAB — C-REACTIVE PROTEIN: CRP: 0.6 mg/dL (ref <1.0)

## 2022-08-22 LAB — PHOSPHORUS: Phosphorus: 3.1 mg/dL (ref 2.5–4.6)

## 2022-08-22 LAB — MAGNESIUM: Magnesium: 2.2 mg/dL (ref 1.7–2.4)

## 2022-08-22 MED ORDER — IOHEXOL 350 MG/ML SOLN
75.0000 mL | Freq: Once | INTRAVENOUS | Status: AC | PRN
  Administered 2022-08-22: 75 mL via INTRAVENOUS

## 2022-08-22 MED ORDER — ATORVASTATIN CALCIUM 20 MG PO TABS: 20.0000 mg | ORAL_TABLET | Freq: Every day | ORAL | 11 refills | Status: AC

## 2022-08-22 MED ORDER — DIPHENHYDRAMINE HCL 50 MG/ML IJ SOLN
50.0000 mg | Freq: Once | INTRAMUSCULAR | Status: AC
  Administered 2022-08-22: 50 mg via INTRAVENOUS
  Filled 2022-08-22: qty 1

## 2022-08-22 MED ORDER — VALSARTAN 80 MG PO TABS: 80.0000 mg | ORAL_TABLET | Freq: Every day | ORAL | 5 refills | Status: DC

## 2022-08-22 MED ORDER — ASPIRIN 81 MG PO TBEC: 81.0000 mg | DELAYED_RELEASE_TABLET | Freq: Every day | ORAL | 0 refills | Status: DC

## 2022-08-22 MED ORDER — METHYLPREDNISOLONE SODIUM SUCC 40 MG IJ SOLR
40.0000 mg | Freq: Once | INTRAMUSCULAR | Status: AC
  Administered 2022-08-22: 40 mg via INTRAVENOUS
  Filled 2022-08-22: qty 1

## 2022-08-22 MED ORDER — ASPIRIN 81 MG PO TBEC: 81.0000 mg | DELAYED_RELEASE_TABLET | Freq: Every day | ORAL | Status: DC

## 2022-08-22 MED ORDER — CLOPIDOGREL BISULFATE 75 MG PO TABS: 75.0000 mg | ORAL_TABLET | Freq: Every day | ORAL | 11 refills | Status: AC

## 2022-08-22 NOTE — TOC Progression Note (Signed)
Transition of Care [facility]) - Progression Note    Patient Details  Name: KRESHA ABELSON MRN: [id number] Date of Birth: Jan 14, 1951  Transition of Care [facility]) CM/SW Contact  Salome Arnt, [place] Phone Number: 08/22/2022, 9:16 AM  Clinical Narrative:    Transition of Care [facility]) Screening Note   Patient Details  Name: CHAELYN BUNYAN Date of Birth: [birth year]   Transition of Care [facility]) CM/SW Contact:    Salome Arnt, LCSW Phone Number: 08/22/2022, 9:16 AM    Transition of Care Department [facility]) has reviewed patient and no TOC needs have been identified at this time. We will continue to monitor patient advancement through interdisciplinary progression rounds. If new patient transition needs arise, please place a TOC consult.        Barriers to Discharge: Barriers Resolved  Expected Discharge Plan and Services         Expected Discharge Date: 08/22/22                                     Social Determinants of Health (SDOH) Interventions SDOH Screenings   Food Insecurity: No Food Insecurity (08/21/2022)  Housing: Low Risk  (08/21/2022)  Transportation Needs: No Transportation Needs (08/21/2022)  Utilities: Not At Risk (08/21/2022)  Tobacco Use: Medium Risk (08/21/2022)    Readmission Risk Interventions     No data to display

## 2022-08-22 NOTE — Telephone Encounter (Signed)
Order from Dr. Denton Brick requesting a 30 day monitor for TIA. Pt enrolled in [employer] and order placed.

## 2022-08-22 NOTE — Evaluation (Signed)
Physical Therapy Evaluation Patient Details Name: [name] MRN: [id number] DOB: October 13, 1950 Today's Date: 08/22/2022  History of Present Illness  [name] is a 72 y.o. female with medical history significant of hypertension, hyperlipidemia, CAD s/p stent placement, anxiety and depression who presents to the emergency department due to left-sided facial droop and right-sided headache noted on waking up this morning.  Patient complained of headache in right occipital area yesterday around 9:30 PM, the headache sensation was described as " bruise sensation".  Ice pack and cold pillows used alleviated the headache, but on waking up this morning, she noted a left-sided facial droop when she looked in the mirror, she also had difficulty in being able to open left eye and she complained of drooling as well.  She complained of intermittent numbness on left side of face, but patient denies any history of fall, stroke or weakness.  She denies chest pain, shortness of breath, nausea, vomiting, abdominal pain.   Clinical Impression  Patient functioning near baseline for mobility and ambulation. She is independent with bed mobility and transfer to standing. Patient with mild unsteadiness with standing and while ambulating likely due to feeling groggy from benadryl but she does not have loss of balance and does not require use of AD. Patient returned to bed at end of session. Patient discharged to care of nursing for ambulation daily as tolerated for length of stay.        Recommendations for follow up therapy are one component of a multi-disciplinary discharge planning process, led by the attending physician.  Recommendations may be updated based on patient status, additional functional criteria and insurance authorization.  Follow Up Recommendations No PT follow up      Assistance Recommended at Discharge PRN  Patient can return home with the following       Equipment Recommendations None  recommended by PT  Recommendations for Other Services       Functional Status Assessment Patient has had a recent decline in their functional status and demonstrates the ability to make significant improvements in function in a reasonable and predictable amount of time.     Precautions / Restrictions Precautions Precautions: None Restrictions Weight Bearing Restrictions: No      Mobility  Bed Mobility Overal bed mobility: Independent                  Transfers Overall transfer level: Independent Equipment used: None               General transfer comment: mild unsteadiness with initial standing    Ambulation/Gait Ambulation/Gait assistance: Supervision, Modified independent (Device/Increase time) Gait Distance (Feet): 25 Feet Assistive device: None Gait Pattern/deviations: Decreased stride length Gait velocity: decresed     General Gait Details: slow, reaches for walls PRN due to feeling groggy from benadryl  Stairs            Wheelchair Mobility    Modified Rankin (Stroke Patients Only)       Balance Overall balance assessment: Independent                                           Pertinent Vitals/Pain Pain Assessment Pain Assessment: No/denies pain    Home Living Family/patient expects to be discharged to:: Private residence Living Arrangements: Parent Available Help at Discharge: Family Type of Home: House Home Access: Stairs to enter Entrance Stairs-Rails:  Right;Left Entrance Stairs-Number of Steps: 3     Home Equipment: Rolling Walker (2 wheels);Cane - single point;BSC/3in1;Shower seat      Prior Function Prior Level of Function : Independent/Modified Independent             Mobility Comments: Patient states community ambulation without AD ADLs Comments: independent     Hand Dominance        Extremity/Trunk Assessment   Upper Extremity Assessment Upper Extremity Assessment: Overall WFL for  tasks assessed    Lower Extremity Assessment Lower Extremity Assessment: Overall WFL for tasks assessed    Cervical / Trunk Assessment Cervical / Trunk Assessment: Normal  Communication   Communication: No difficulties  Cognition Arousal/Alertness: Awake/alert Behavior During Therapy: WFL for tasks assessed/performed Overall Cognitive Status: Within Functional Limits for tasks assessed                                          General Comments      Exercises     Assessment/Plan    PT Assessment Patient does not need any further PT services  PT Problem List         PT Treatment Interventions      PT Goals (Current goals can be found in the Care Plan section)  Acute Rehab PT Goals Patient Stated Goal: return home PT Goal Formulation: With patient Time For Goal Achievement: 08/22/22 Potential to Achieve Goals: Good    Frequency       Co-evaluation               AM-PAC PT "6 Clicks" Mobility  Outcome Measure Help needed turning from your back to your side while in a flat bed without using bedrails?: None Help needed moving from lying on your back to sitting on the side of a flat bed without using bedrails?: None Help needed moving to and from a bed to a chair (including a wheelchair)?: None Help needed standing up from a chair using your arms (e.g., wheelchair or bedside chair)?: None Help needed to walk in hospital room?: None Help needed climbing 3-5 steps with a railing? : None 6 Click Score: 24    End of Session   Activity Tolerance: Patient tolerated treatment well Patient left: in bed;with call bell/phone within reach Nurse Communication: Mobility status PT Visit Diagnosis: Other abnormalities of gait and mobility (R26.89);Unsteadiness on feet (R26.81)    Time: 5038-8828 PT Time Calculation (min) (ACUTE ONLY): 9 min   Charges:   PT Evaluation $PT Eval Low Complexity: 1 Low         8:41 AM, 08/22/22 [name]  PT, DPT Physical Therapist at [facility]

## 2022-08-22 NOTE — Discharge Summary (Signed)
[name], is a 72 y.o. female  DOB Aug 09, 1950  MRN [id number].  Admission date:  08/21/2022  Admitting Physician  Bernadette Hoit, DO  Discharge Date:  08/22/2022   Primary MD  Manon Hilding, MD  Recommendations for primary care physician for things to follow:   1)Avoid ibuprofen/Advil/Aleve/Motrin/Goody Powders/Naproxen/BC powders/Meloxicam/Diclofenac/Indomethacin and other Nonsteroidal anti-inflammatory medications as these will make you more likely to bleed and can cause stomach ulcers, can also cause Kidney problems.   2)-Please take Aspirin 81 mg daily along with Plavix 75 mg daily for 21 days then after that STOP the Aspirin and continue ONLY Plavix 75 mg daily indefinitely--for secondary stroke Prevention   3) please increase atorvastatin/Lipitor to 20 mg daily from 10 mg daily--for stroke prevention  4)Stop lisinopril HCTZ--hydrochlorothiazide...Marland KitchenMarland KitchenMarland Kitchen please take valsartan 80 mg daily instead  5) please wear heart monitor/Zio patch/event monitor as advised to look for possible atrial fibrillation/A-fib which can increase your risk for strokes  6) please follow-up with neurologist in 3 months as advised for recheck and reevaluation due to this episode of mini stroke  Admission Diagnosis  TIA (transient ischemic attack) [G45.9] Facial droop [R29.810]   Discharge Diagnosis  TIA (transient ischemic attack) [G45.9] Facial droop [R29.810]    Principal Problem:   TIA (transient ischemic attack) Active Problems:   Facial droop   Headache   Essential hypertension   Mixed hyperlipidemia   Fibromyalgia   CAD S/P percutaneous coronary angioplasty   Anxiety and depression      Past Medical History:  Diagnosis Date   Anxiety    Arthritis    Complication of anesthesia    difficult to wake up after anesthesia   Coronary artery disease    LAD Cypher stent with PCI of the diagonal in 2004    Depression    Dyslipidemia    Essential hypertension    Fibromyalgia    History of migraine    Pre-diabetes     Past Surgical History:  Procedure Laterality Date   ABDOMINAL HYSTERECTOMY     partial first, then full   BACK SURGERY     01-30-2017 [facility]    CARDIAC CATHETERIZATION     CARPAL TUNNEL RELEASE Right    CARPAL TUNNEL RELEASE Left 04/11/2022   Procedure: CARPAL TUNNEL RELEASE;  Surgeon: Carole Civil, MD;  Location: [facility];  Service: Orthopedics;  Laterality: Left;   CHOLECYSTECTOMY     COLONOSCOPY     CORONARY ANGIOPLASTY  2004   REPLACEMENT TOTAL KNEE Right    SHOULDER ARTHROSCOPY Right    TOTAL HIP ARTHROPLASTY Left    TOTAL HIP ARTHROPLASTY Right 06/01/2017   Procedure: RIGHT TOTAL HIP ARTHROPLASTY ANTERIOR APPROACH;  Surgeon: Mcarthur Rossetti, MD;  Location: [facility];  Service: Orthopedics;  Laterality: Right;  Needs RNFA   TUBAL LIGATION         HPI  from the history and physical done on the day of admission:   HPI: [name] is a 72 y.o. female with medical history  significant of hypertension, hyperlipidemia, CAD s/p stent placement, anxiety and depression who presents to the emergency department due to left-sided facial droop and right-sided headache noted on waking up this morning.  Patient complained of headache in right occipital area yesterday around 9:30 PM, the headache sensation was described as " bruise sensation".  Ice pack and cold pillows used alleviated the headache, but on waking up this morning, she noted a left-sided facial droop when she looked in the mirror, she also had difficulty in being able to open left eye and she complained of drooling as well.  She complained of intermittent numbness on left side of face, but patient denies any history of fall, stroke or weakness.  She denies chest pain, shortness of breath, nausea, vomiting, abdominal pain.   ED Course:  In the emergency department, BP was 183/64, other vital signs were within  normal range.  Workup in the ED showed normal CBC and BMP except for blood glucose of 109.  Alcohol level was less than 10.  Influenza A, B, SARS coronavirus 2, RSV was negative. CT head without contrast showed no acute intracranial abnormality MRI head without contrast showed no evidence of acute intracranial abnormality While in the ED, symptoms resolved and patient was back to baseline, neurologist was consulted and recommended admission for TIA workup.   Review of Systems: Review of systems as noted in the HPI. All other systems reviewed and are negative     Hospital Course:    A/p Left-sided facial droop and ptosis--- suspect TIA -Neurosymptoms have resolved -No significant abnormality on telemetry CT head without contrast showed no acute intracranial abnormality MRI head without contrast showed no evidence of acute intracranial abnormality Echocardiogram with EF greater than 75%, hyperdynamic, diastolic function is normal, mild LVH, no regional wall motion abnormalities, no mitral or aortic stenosis CT angiography of head and neck without acute findings, no LVO  -A1c is 5.3, electrolytes WNL, CBC WNL -LDL 61, HDL 41 -LFTs WNL -ESR and CRP are not elevated -Physical therapy eval appreciated recommends no PT follow-up -Neurology consult appreciated -Will increase Lipitor to 20 mg daily from 10 mg daily PTA, even if her lipid panel is within desired limits, patient should still take Lipitor/Statin for it's Pleiotropic effects (beyond cholesterol lowering benefits) -BP Medications adjusted -Take Aspirin 81 mg daily along with Plavix 75 mg daily for 21 days then after that STOP the Aspirin and continue ONLY Plavix 75 mg daily indefinitely--for secondary stroke Prevention (Per The multicenter SAMMPRIS trial) -30-day event monitor to rule out A-fib -  2)Essential hypertension -BP medications adjusted, see discharge med rec and discharge instructions  3)Fibromyalgia Continue  gabapentin   4)CAD s/p stent placement -Lipitor and antiplatelet agents as outlined #1   5)Anxiety and depression Continue Xanax and Zoloft   6)Class 1 Obesity- -Low calorie diet, portion control and increase physical activity discussed with patient -Body mass index is 33.06 kg/m.     Discharge Condition: stable  Follow UP   Follow-up Information     Garvin Fila, MD. Call in 3 month(s).   Specialties: Neurology, Radiology Why: - TIA/mini stroke follow-up Contact information: [place] [address] [phone]                 Consults obtained -neurology  Diet and Activity recommendation:  As advised  Discharge Instructions    Discharge Instructions     Ambulatory Referral for Lung Cancer Scre   Complete by: As directed  Call MD for:  difficulty breathing, headache or visual disturbances   Complete by: As directed    Call MD for:  persistant dizziness or light-headedness   Complete by: As directed    Call MD for:  persistant nausea and vomiting   Complete by: As directed    Call MD for:  temperature >100.4   Complete by: As directed    Diet - low sodium heart healthy   Complete by: As directed    Discharge instructions   Complete by: As directed    1)Avoid ibuprofen/Advil/Aleve/Motrin/Goody Powders/Naproxen/BC powders/Meloxicam/Diclofenac/Indomethacin and other Nonsteroidal anti-inflammatory medications as these will make you more likely to bleed and can cause stomach ulcers, can also cause Kidney problems.   2)-Please take Aspirin 81 mg daily along with Plavix 75 mg daily for 21 days then after that STOP the Aspirin and continue ONLY Plavix 75 mg daily indefinitely--for secondary stroke Prevention   3) please increase atorvastatin/Lipitor to 20 mg daily from 10 mg daily--for stroke prevention  4)Stop lisinopril HCTZ--hydrochlorothiazide...Marland KitchenMarland KitchenMarland Kitchen please take valsartan 80 mg daily instead  5) please wear heart monitor/Zio  patch/event monitor as advised to look for possible atrial fibrillation/A-fib which can increase your risk for strokes  6) please follow-up with neurologist in 3 months as advised for recheck and reevaluation due to this episode of mini stroke   Increase activity slowly   Complete by: As directed          Discharge Medications     Allergies as of 08/22/2022       Reactions   Iodine Swelling   Red spots on skin after cath, swollen lips and eyes   Iohexol Swelling   Red spots on skin after cath.  Swollen lips and eyes.     Ambien [zolpidem] Other (See Comments)   hallucinations   Adhesive [tape] Dermatitis   Plastic tape causes skin irritation    Niacin Rash   MAY BE PREDICTABLE RESPONSE TO NIACIN   Penicillins Rash        Medication List     STOP taking these medications    lisinopril-hydrochlorothiazide 10-12.5 MG tablet Commonly known as: ZESTORETIC       TAKE these medications    acetaminophen 500 MG tablet Commonly known as: TYLENOL Take 1,000 mg by mouth every 6 (six) hours as needed for mild pain or moderate pain.   ALPRAZolam 0.5 MG tablet Commonly known as: XANAX Take 0.5 mg by mouth 3 (three) times daily as needed for anxiety.   amLODipine 10 MG tablet Commonly known as: NORVASC Take 10 mg by mouth daily.   aspirin EC 81 MG tablet Take 1 tablet (81 mg total) by mouth daily with breakfast. - take Aspirin 81 mg daily along with Plavix 75 mg daily for 21 days then after that STOP the Aspirin and continue ONLY Plavix 75 mg daily indefinitely--for secondary stroke Prevention What changed:  when to take this additional instructions   atorvastatin 20 MG tablet Commonly known as: Lipitor Take 1 tablet (20 mg total) by mouth daily. What changed:  medication strength how much to take when to take this   cholecalciferol 1000 units tablet Commonly known as: VITAMIN D Take 1,000 Units by mouth daily.   clobetasol 0.05 % external solution Commonly  known as: TEMOVATE Apply 1 application topically 2 (two) times daily. scalp   clopidogrel 75 MG tablet Commonly known as: Plavix Take 1 tablet (75 mg total) by mouth daily. - take Aspirin 81 mg daily along with  Plavix 75 mg daily for 21 days then after that STOP the Aspirin and continue ONLY Plavix 75 mg daily indefinitely--for secondary stroke Prevention   cyclobenzaprine 10 MG tablet Commonly known as: FLEXERIL Take 10 mg by mouth 3 (three) times daily as needed for muscle spasms.   furosemide 40 MG tablet Commonly known as: LASIX Take 40 mg by mouth daily.   gabapentin 400 MG capsule Commonly known as: NEURONTIN Take 800 mg by mouth 2 (two) times daily.   multivitamin with minerals tablet Take 1 tablet by mouth daily. Women's One-A-Day   nitroGLYCERIN 0.4 MG SL tablet Commonly known as: NITROSTAT Place 1 tablet (0.4 mg total) under the tongue every 5 (five) minutes x 3 doses as needed for chest pain (if no relief after 3rd dose, proceed to the ED or call 911).   oxybutynin 5 MG 24 hr tablet Commonly known as: DITROPAN-XL Take 5 mg by mouth at bedtime.   oxybutynin 5 MG tablet Commonly known as: DITROPAN   sertraline 25 MG tablet Commonly known as: ZOLOFT Take 25 mg by mouth daily.   traMADol 50 MG tablet Commonly known as: Ultram One tablet every six hours as needed for pain.   triamcinolone 0.1 % paste Commonly known as: KENALOG Use as directed 1 application in the mouth or throat 2 (two) times daily as needed (mouth ulcers).   valsartan 80 MG tablet Commonly known as: DIOVAN Take 1 tablet (80 mg total) by mouth daily. In place of Lisinopril/HCTZ        Major procedures and Radiology Reports - PLEASE review detailed and final reports for all details, in brief -   ECHOCARDIOGRAM COMPLETE  Result Date: 08/22/2022    ECHOCARDIOGRAM REPORT   Patient Name:   DIEGO DELANCEY Date of Exam: 08/22/2022 Medical Rec #:  [id number]    Height:       62.0 in Accession #:     [id number]   Weight:       180.8 lb Date of Birth:  [birth year]     BSA:          1.831 m Patient Age:    55 years     BP:           149/87 mmHg Patient Gender: F            HR:           64 bpm. Exam Location:  [facility] Procedure: 2D Echo, Cardiac Doppler and Color Doppler Indications:    TIA G45.9  History:        Patient has prior history of Echocardiogram examinations, most                 recent 04/27/2015. CAD, TIA; Risk Factors:Hypertension,                 Dyslipidemia and Prediabetes.  Sonographer:    Alvino Chapel RCS Referring Phys: [id number] OLADAPO ADEFESO IMPRESSIONS  1. Left ventricular ejection fraction, by estimation, is >75%. The left ventricle has hyperdynamic function. The left ventricle has no regional wall motion abnormalities. There is mild left ventricular hypertrophy. Left ventricular diastolic parameters were normal.  2. Right ventricular systolic function is normal. The right ventricular size is normal. Tricuspid regurgitation signal is inadequate for assessing PA pressure.  3. The mitral valve is normal in structure. No evidence of mitral valve regurgitation. No evidence of mitral stenosis.  4. The aortic valve was not well visualized. Aortic valve regurgitation is not  visualized. No aortic stenosis is present.  5. The inferior vena cava is normal in size with greater than 50% respiratory variability, suggesting right atrial pressure of 3 mmHg. Comparison(s): No prior Echocardiogram. FINDINGS  Left Ventricle: Left ventricular ejection fraction, by estimation, is >75%. The left ventricle has hyperdynamic function. The left ventricle has no regional wall motion abnormalities. The left ventricular internal cavity size was normal in size. There is mild left ventricular hypertrophy. Left ventricular diastolic parameters were normal. Right Ventricle: The right ventricular size is normal. No increase in right ventricular wall thickness. Right ventricular systolic function is normal. Tricuspid  regurgitation signal is inadequate for assessing PA pressure. Left Atrium: Left atrial size was normal in size. Right Atrium: Right atrial size was normal in size. Pericardium: There is no evidence of pericardial effusion. Mitral Valve: The mitral valve is normal in structure. No evidence of mitral valve regurgitation. No evidence of mitral valve stenosis. Tricuspid Valve: The tricuspid valve is not well visualized. Tricuspid valve regurgitation is not demonstrated. No evidence of tricuspid stenosis. Aortic Valve: The aortic valve was not well visualized. Aortic valve regurgitation is not visualized. No aortic stenosis is present. Aortic valve mean gradient measures 6.0 mmHg. Aortic valve peak gradient measures 12.8 mmHg. Aortic valve area, by VTI measures 2.37 cm. Pulmonic Valve: The pulmonic valve was not well visualized. Pulmonic valve regurgitation is not visualized. No evidence of pulmonic stenosis. Aorta: The aortic root is normal in size and structure. Venous: The inferior vena cava is normal in size with greater than 50% respiratory variability, suggesting right atrial pressure of 3 mmHg. IAS/Shunts: No atrial level shunt detected by color flow Doppler.  LEFT VENTRICLE PLAX 2D LVIDd:         4.00 cm   Diastology LVIDs:         2.00 cm   LV e' medial:    7.83 cm/s LV PW:         1.20 cm   LV E/e' medial:  14.6 LV IVS:        1.10 cm   LV e' lateral:   8.49 cm/s LVOT diam:     1.90 cm   LV E/e' lateral: 13.4 LV SV:         104 LV SV Index:   57 LVOT Area:     2.84 cm  RIGHT VENTRICLE RV S prime:     13.40 cm/s TAPSE (M-mode): 2.9 cm LEFT ATRIUM             Index        RIGHT ATRIUM           Index LA diam:        3.20 cm 1.75 cm/m   RA Area:     14.40 cm LA Vol (A2C):   52.8 ml 28.83 ml/m  RA Volume:   35.00 ml  19.11 ml/m LA Vol (A4C):   49.4 ml 26.97 ml/m LA Biplane Vol: 52.6 ml 28.72 ml/m  AORTIC VALVE AV Area (Vmax):    2.39 cm AV Area (Vmean):   2.32 cm AV Area (VTI):     2.37 cm AV Vmax:            179.00 cm/s AV Vmean:          118.000 cm/s AV VTI:            0.437 m AV Peak Grad:      12.8 mmHg AV Mean Grad:      6.0  mmHg LVOT Vmax:         151.00 cm/s LVOT Vmean:        96.700 cm/s LVOT VTI:          0.366 m LVOT/AV VTI ratio: 0.84  AORTA Ao Root diam: 2.70 cm MITRAL VALVE MV Area (PHT): 2.91 cm     SHUNTS MV Decel Time: 261 msec     Systemic VTI:  0.37 m MV E velocity: 114.00 cm/s  Systemic Diam: 1.90 cm MV A velocity: 95.60 cm/s MV E/A ratio:  1.19 Vishnu Priya Mallipeddi Electronically signed by Lorelee Cover Mallipeddi Signature Date/Time: 08/22/2022/1:14:24 PM    Final    CT ANGIO HEAD NECK W WO CM  Result Date: 08/22/2022 CLINICAL DATA:  Headache 2 days ago. Woke up with left facial droop and ptosis. EXAM: CT ANGIOGRAPHY HEAD AND NECK TECHNIQUE: Multidetector CT imaging of the head and neck was performed using the standard protocol during bolus administration of intravenous contrast. Multiplanar CT image reconstructions and MIPs were obtained to evaluate the vascular anatomy. Carotid stenosis measurements (when applicable) are obtained utilizing NASCET criteria, using the distal internal carotid diameter as the denominator. RADIATION DOSE REDUCTION: This exam was performed according to the departmental dose-optimization program which includes automated exposure control, adjustment of the mA and/or kV according to patient size and/or use of iterative reconstruction technique. CONTRAST:  87m OMNIPAQUE IOHEXOL 350 MG/ML SOLN COMPARISON:  Brain MRI from yesterday FINDINGS: CT HEAD FINDINGS Brain: No evidence of acute infarction, hemorrhage, hydrocephalus, extra-axial collection or mass lesion/mass effect. Vascular: See below Skull: No acute or aggressive finding Sinuses/Orbits: No acute finding. Review of the MIP images confirms the above findings CTA NECK FINDINGS Aortic arch: Atheromatous plaque, 2 vessel branching. Right carotid system: Atheromatous plaque at the brachiocephalic and common  carotid without flow reducing stenosis. Mainly calcified plaque accentuated at the bifurcation with proximal ICA stenosis measuring on 40% on sagittal reformats. No ulceration or beading Left carotid system: Being mainly calcified plaque accentuated at the bifurcation and proximal ICA. Proximal ICA stenosis measures 40% based on axial measurements. Vertebral arteries: Proximal subclavian atherosclerosis on both sides without flow reducing stenosis. Strong left vertebral artery dominance. Atheromatous left vertebral origin stenosis measures 60%. Both vertebral arteries are patent to the dura without dissection or convincing beading. Skeleton: Generalized cervical spine degeneration with ridging. Other neck: No acute finding Upper chest: Clear apical lungs Review of the MIP images confirms the above findings CTA HEAD FINDINGS Anterior circulation: Mild branch vessel atherosclerosis. No significant stenosis, proximal occlusion, aneurysm, or vascular malformation. Posterior circulation: No significant stenosis, proximal occlusion, aneurysm, or vascular malformation. Venous sinuses: Diffusely patent Anatomic variants: None significant Review of the MIP images confirms the above findings IMPRESSION: 1. No emergent finding. 2. Atherosclerosis with 40% bilateral proximal ICA stenosis. 60% atheromatous narrowing at the left vertebral origin. Electronically Signed   By: JJorje GuildM.D.   On: 08/22/2022 07:17   MR BRAIN WO CONTRAST  Result Date: 08/21/2022 CLINICAL DATA:  Neuro deficit, acute, stroke suspected EXAM: MRI HEAD WITHOUT CONTRAST TECHNIQUE: Multiplanar, multiecho pulse sequences of the brain and surrounding structures were obtained without intravenous contrast. COMPARISON:  CT head from the same day. FINDINGS: Brain: No acute infarction, hemorrhage, hydrocephalus, extra-axial collection or mass lesion. Vascular: Major arterial flow voids are maintained skull base. Skull and upper cervical spine: Normal  marrow signal. Sinuses/Orbits: Clear sinuses.  No acute orbital findings. Other: No mastoid effusions. IMPRESSION: No evidence of acute intracranial abnormality. Electronically Signed  By: Margaretha Sheffield M.D.   On: 08/21/2022 17:09   CT HEAD WO CONTRAST  Result Date: 08/21/2022 CLINICAL DATA:  Stroke suspected EXAM: CT HEAD WITHOUT CONTRAST TECHNIQUE: Contiguous axial images were obtained from the base of the skull through the vertex without intravenous contrast. RADIATION DOSE REDUCTION: This exam was performed according to the departmental dose-optimization program which includes automated exposure control, adjustment of the mA and/or kV according to patient size and/or use of iterative reconstruction technique. COMPARISON:  None Available. FINDINGS: Brain: No evidence of acute infarction, hemorrhage, hydrocephalus, extra-axial collection or mass lesion/mass effect. Partially empty sella. Vascular: No hyperdense vessel or unexpected calcification. Skull: Normal. Negative for fracture or focal lesion. Sinuses/Orbits: No mastoid effusion. Paranasal sinuses are clear. Orbits are unremarkable. Other: None. IMPRESSION: No acute intracranial abnormality. Electronically Signed   By: Marin Roberts M.D.   On: 08/21/2022 14:04    Micro Results   Recent Results (from the past 240 hour(s))  Resp panel by RT-PCR (RSV, Flu A&B, Covid) Anterior Nasal Swab     Status: None   Collection Time: 08/21/22  1:46 PM   Specimen: Anterior Nasal Swab  Result Value Ref Range Status   SARS Coronavirus 2 by RT PCR NEGATIVE NEGATIVE Final    Comment: (NOTE) SARS-CoV-2 target nucleic acids are NOT DETECTED.  The SARS-CoV-2 RNA is generally detectable in upper respiratory specimens during the acute phase of infection. The lowest concentration of SARS-CoV-2 viral copies this assay can detect is 138 copies/mL. A negative result does not preclude SARS-Cov-2 infection and should not be used as the sole basis for treatment  or other patient management decisions. A negative result may occur with  improper specimen collection/handling, submission of specimen other than nasopharyngeal swab, presence of viral mutation(s) within the areas targeted by this assay, and inadequate number of viral copies(<138 copies/mL). A negative result must be combined with clinical observations, patient history, and epidemiological information. The expected result is Negative.  Fact Sheet for Patients:  EntrepreneurPulse.com.au  Fact Sheet for Healthcare Providers:  IncredibleEmployment.be  This test is no t yet approved or cleared by the [place] FDA and  has been authorized for detection and/or diagnosis of SARS-CoV-2 by FDA under an Emergency Use Authorization (EUA). This EUA will remain  in effect (meaning this test can be used) for the duration of the COVID-19 declaration under Section 564(b)(1) of the Act, 21 U.S.C.section 360bbb-3(b)(1), unless the authorization is terminated  or revoked sooner.       Influenza A by PCR NEGATIVE NEGATIVE Final   Influenza B by PCR NEGATIVE NEGATIVE Final    Comment: (NOTE) The Xpert Xpress SARS-CoV-2/FLU/RSV plus assay is intended as an aid in the diagnosis of influenza from Nasopharyngeal swab specimens and should not be used as a sole basis for treatment. Nasal washings and aspirates are unacceptable for Xpert Xpress SARS-CoV-2/FLU/RSV testing.  Fact Sheet for Patients: EntrepreneurPulse.com.au  Fact Sheet for Healthcare Providers: IncredibleEmployment.be  This test is not yet approved or cleared by the [place] FDA and has been authorized for detection and/or diagnosis of SARS-CoV-2 by FDA under an Emergency Use Authorization (EUA). This EUA will remain in effect (meaning this test can be used) for the duration of the COVID-19 declaration under Section 564(b)(1) of the Act, 21 U.S.C. section  360bbb-3(b)(1), unless the authorization is terminated or revoked.     Resp Syncytial Virus by PCR NEGATIVE NEGATIVE Final    Comment: (NOTE) Fact Sheet for Patients: EntrepreneurPulse.com.au  Fact Sheet for  Healthcare Providers: IncredibleEmployment.be  This test is not yet approved or cleared by the [place] and has been authorized for detection and/or diagnosis of SARS-CoV-2 by FDA under an Emergency Use Authorization (EUA). This EUA will remain in effect (meaning this test can be used) for the duration of the COVID-19 declaration under Section 564(b)(1) of the Act, 21 U.S.C. section 360bbb-3(b)(1), unless the authorization is terminated or revoked.  Performed at [facility], [address] [place], [place] [address]     Today   Subjective    Shanera Meske today has no new complaints  -  No further neuro concerns- -no further headaches -No chest pains ,no palpitations,  no dizziness          Patient has been seen and examined prior to discharge   Objective   Blood pressure (!) 149/87, pulse (!) 53, temperature 97.6 F (36.4 C), resp. rate 16, height '5\' 2"'$  (1.575 m), weight 82 kg, SpO2 95 %.   Intake/Output Summary (Last 24 hours) at 08/22/2022 1410 Last data filed at 08/22/2022 0900 Gross per 24 hour  Intake 240 ml  Output --  Net 240 ml    Exam Gen:- Awake Alert, no acute distress  HEENT:- [place].AT, No sclera icterus Neck-Supple Neck,No JVD,.  Lungs-  CTAB , good air movement bilaterally CV- S1, S2 normal, regular Abd-  +ve B.Sounds, Abd Soft, No tenderness,    Extremity/Skin:- No  edema,   good pulses Psych-affect is appropriate, oriented x3 Neuro-no new focal deficits, no tremors    Data Review   CBC w Diff:  Lab Results  Component Value Date   WBC 8.8 08/22/2022   HGB 12.8 08/22/2022   HCT 38.3 08/22/2022   PLT 351 08/22/2022   LYMPHOPCT 30 08/21/2022   MONOPCT 5 08/21/2022   EOSPCT 3 08/21/2022    BASOPCT 0 08/21/2022    CMP:  Lab Results  Component Value Date   NA 138 08/22/2022   K 3.5 08/22/2022   CL 99 08/22/2022   CO2 29 08/22/2022   BUN 13 08/22/2022   CREATININE 0.89 08/22/2022   PROT 6.9 08/22/2022   ALBUMIN 3.8 08/22/2022   BILITOT 0.6 08/22/2022   ALKPHOS 48 08/22/2022   AST 34 08/22/2022   ALT 27 08/22/2022   Total Discharge time is about 33 minutes  Roxan Hockey M.D on 08/22/2022 at 2:10 PM  Go to [email or web address] -  for contact info  [facility] - Office  [phone]

## 2022-08-22 NOTE — Progress Notes (Signed)
I connected with  [name] on 08/22/22 by a video enabled telemedicine application and verified that I am speaking with the correct person using two identifiers.   I discussed the limitations of evaluation and management by telemedicine. The patient expressed understanding and agreed to proceed.  Location of patient: [facility] Location of physician: [facility]  Subjective: Denies any other symptoms overnight.  States her left-sided facial droop and eye droop has improved.  States she had some pain behind the right ear which is also resolved after putting ice packs on it.  Denies any recent infection.  ROS: negative except above  Examination  Vital signs in last 24 hours: Temp:  [97.6 F (36.4 C)-98.3 F (36.8 C)] 97.6 F (36.4 C) (01/30 0338) Pulse Rate:  [53-68] 53 (01/30 0338) Resp:  [15-22] 16 (01/30 0338) BP: (110-183)/(54-117) 149/87 (01/30 0338) SpO2:  [94 %-100 %] 95 % (01/30 0338) FiO2 (%):  [21 %] 21 % (01/29 2011) Weight:  [82 kg-83.5 kg] 82 kg (01/29 2223)  General: lying in bed, NAD Neuro: MS: Alert, oriented, follows commands CN: pupils equal and reactive,  EOMI, face symmetric, tongue midline, normal sensation over face, Motor: Antigravity strength in all 4 extremities without drift  Basic Metabolic Panel: Recent Labs  Lab 08/21/22 1346 08/21/22 1353 08/22/22 0427  NA 139 140 138  K 4.1 4.2 3.5  CL 101 100 99  CO2 28  --  29  GLUCOSE 109* 112* 117*  BUN '12 11 13  '$ CREATININE 0.91 1.00 0.89  CALCIUM 9.3  --  9.1  MG  --   --  2.2  PHOS  --   --  3.1    CBC: Recent Labs  Lab 08/21/22 1346 08/21/22 1353 08/22/22 0427  WBC 7.7  --  8.8  NEUTROABS 4.8  --   --   HGB 12.9 13.3 12.8  HCT 38.1 39.0 38.3  MCV 89.2  --  88.7  PLT 349  --  351    Coagulation Studies: Recent Labs    08/21/22 1346  LABPROT 12.4  INR 0.9    Imaging CT without contrast 08/21/2022: No acute intracranial abnormality.   CTA head and neck with  and without contrast 08/21/2022: No emergent finding. Atherosclerosis with 40% bilateral proximal ICA stenosis. 60% atheromatous narrowing at the left vertebral origin.  MRI brain without contrast 08/21/2022: No evidence of acute intracranial abnormality.   ASSESSMENT AND PLAN: 72 year old female with hypertension, coronary artery disease, hyperlipidemia who presented with headache, left-sided facial droop and ptosis which has since resolved (lasted for about 4+ hours)  Transient ischemic attack -Patient symptoms are completely resolved.  Suspect transient ischemic attack versus less likely migraine with aura  Recommendations -Recommend aspirin 81 mg daily and Plavix and 5 mg daily for 3 weeks followed by Plavix 75 mg daily -Continue atorvastatin 10 mg daily, LDL already 61 -Echocardiogram ordered and pending.  If no thrombus, recommend 30-day event monitor at the time of discharge -Recommend follow-up with neurology in 3 months -Discussed plan in detail with patient at bedside and Dr. Denton Brick via secure chat  I have spent a total of 36  minutes with the patient reviewing hospital notes,  test results, labs and examining the patient as well as establishing an assessment and plan that was discussed personally with the patient.  > 50% of time was spent in direct patient care.    [name] Epilepsy [facility] For questions after 5pm please refer to  AMION to reach the Neurologist on call

## 2022-08-22 NOTE — Progress Notes (Signed)
*  PRELIMINARY RESULTS* Echocardiogram 2D Echocardiogram has been performed.  [name] 08/22/2022, 9:41 AM

## 2022-08-22 NOTE — Discharge Instructions (Addendum)
1)Avoid ibuprofen/Advil/Aleve/Motrin/Goody Powders/Naproxen/BC powders/Meloxicam/Diclofenac/Indomethacin and other Nonsteroidal anti-inflammatory medications as these will make you more likely to bleed and can cause stomach ulcers, can also cause Kidney problems.   2)-Please take Aspirin 81 mg daily along with Plavix 75 mg daily for 21 days then after that STOP the Aspirin and continue ONLY Plavix 75 mg daily indefinitely--for secondary stroke Prevention   3) please increase atorvastatin/Lipitor to 20 mg daily from 10 mg daily--for stroke prevention  4)Stop lisinopril HCTZ--hydrochlorothiazide...Marland KitchenMarland KitchenMarland Kitchen please take valsartan 80 mg daily instead  5) please wear heart monitor/Zio patch/event monitor as advised to look for possible atrial fibrillation/A-fib which can increase your risk for strokes  6) please follow-up with neurologist in 3 months as advised for recheck and reevaluation due to this episode of mini stroke

## 2022-08-26 ENCOUNTER — Encounter (HOSPITAL_COMMUNITY): Payer: Self-pay

## 2022-08-26 ENCOUNTER — Other Ambulatory Visit: Payer: Self-pay

## 2022-08-26 ENCOUNTER — Emergency Department (HOSPITAL_COMMUNITY)
Admission: EM | Admit: 2022-08-26 | Discharge: 2022-08-26 | Disposition: A | Discharge status: Home / Self Care | Payer: Medicare Other | Attending: Student | Admitting: Student

## 2022-08-26 DIAGNOSIS — I10 Essential (primary) hypertension: Secondary | ICD-10-CM | POA: Diagnosis not present

## 2022-08-26 DIAGNOSIS — Z96651 Presence of right artificial knee joint: Secondary | ICD-10-CM | POA: Diagnosis not present

## 2022-08-26 DIAGNOSIS — I2510 Atherosclerotic heart disease of native coronary artery without angina pectoris: Secondary | ICD-10-CM | POA: Insufficient documentation

## 2022-08-26 DIAGNOSIS — Z79899 Other long term (current) drug therapy: Secondary | ICD-10-CM | POA: Diagnosis not present

## 2022-08-26 DIAGNOSIS — Z96643 Presence of artificial hip joint, bilateral: Secondary | ICD-10-CM | POA: Insufficient documentation

## 2022-08-26 DIAGNOSIS — Z87891 Personal history of nicotine dependence: Secondary | ICD-10-CM | POA: Insufficient documentation

## 2022-08-26 DIAGNOSIS — T7840XD Allergy, unspecified, subsequent encounter: Secondary | ICD-10-CM | POA: Diagnosis not present

## 2022-08-26 DIAGNOSIS — Z7982 Long term (current) use of aspirin: Secondary | ICD-10-CM | POA: Diagnosis not present

## 2022-08-26 DIAGNOSIS — R21 Rash and other nonspecific skin eruption: Secondary | ICD-10-CM | POA: Diagnosis not present

## 2022-08-26 DIAGNOSIS — T508X5A Adverse effect of diagnostic agents, initial encounter: Secondary | ICD-10-CM | POA: Diagnosis not present

## 2022-08-26 DIAGNOSIS — Z955 Presence of coronary angioplasty implant and graft: Secondary | ICD-10-CM | POA: Diagnosis not present

## 2022-08-26 LAB — CBC WITH DIFFERENTIAL/PLATELET
Abs Immature Granulocytes: 0.05 10*3/uL (ref 0.00–0.07)
Basophils Absolute: 0 10*3/uL (ref 0.0–0.1)
Basophils Relative: 0 %
Eosinophils Absolute: 0.9 10*3/uL — ABNORMAL HIGH (ref 0.0–0.5)
Eosinophils Relative: 7 %
HCT: 42.2 % (ref 36.0–46.0)
Hemoglobin: 14.2 g/dL (ref 12.0–15.0)
Immature Granulocytes: 0 %
Lymphocytes Relative: 13 %
Lymphs Abs: 1.7 10*3/uL (ref 0.7–4.0)
MCH: 29.7 pg (ref 26.0–34.0)
MCHC: 33.6 g/dL (ref 30.0–36.0)
MCV: 88.3 fL (ref 80.0–100.0)
Monocytes Absolute: 0.7 10*3/uL (ref 0.1–1.0)
Monocytes Relative: 5 %
Neutro Abs: 9.7 10*3/uL — ABNORMAL HIGH (ref 1.7–7.7)
Neutrophils Relative %: 75 %
Platelets: 450 10*3/uL — ABNORMAL HIGH (ref 150–400)
RBC: 4.78 MIL/uL (ref 3.87–5.11)
RDW: 12.8 % (ref 11.5–15.5)
WBC: 13.1 10*3/uL — ABNORMAL HIGH (ref 4.0–10.5)
nRBC: 0 % (ref 0.0–0.2)

## 2022-08-26 LAB — COMPREHENSIVE METABOLIC PANEL
ALT: 23 U/L (ref 0–44)
AST: 29 U/L (ref 15–41)
Albumin: 3.8 g/dL (ref 3.5–5.0)
Alkaline Phosphatase: 45 U/L (ref 38–126)
Anion gap: 12 (ref 5–15)
BUN: 22 mg/dL (ref 8–23)
CO2: 24 mmol/L (ref 22–32)
Calcium: 8.9 mg/dL (ref 8.9–10.3)
Chloride: 99 mmol/L (ref 98–111)
Creatinine, Ser: 1.34 mg/dL — ABNORMAL HIGH (ref 0.44–1.00)
GFR, Estimated: 42 mL/min — ABNORMAL LOW (ref >60)
Glucose, Bld: 112 mg/dL — ABNORMAL HIGH (ref 70–99)
Potassium: 3.6 mmol/L (ref 3.5–5.1)
Sodium: 135 mmol/L (ref 135–145)
Total Bilirubin: 0.6 mg/dL (ref 0.3–1.2)
Total Protein: 6.8 g/dL (ref 6.5–8.1)

## 2022-08-26 LAB — SEDIMENTATION RATE: Sed Rate: 15 mm/hr (ref 0–22)

## 2022-08-26 LAB — C-REACTIVE PROTEIN: CRP: 1.3 mg/dL — ABNORMAL HIGH (ref <1.0)

## 2022-08-26 LAB — CK: Total CK: 108 U/L (ref 38–234)

## 2022-08-26 MED ORDER — HYDROXYZINE HCL 25 MG PO TABS: 25.0000 mg | ORAL_TABLET | Freq: Four times a day (QID) | ORAL | 0 refills | Status: DC

## 2022-08-26 MED ORDER — DIPHENHYDRAMINE HCL 50 MG/ML IJ SOLN
25.0000 mg | Freq: Once | INTRAMUSCULAR | Status: AC
  Administered 2022-08-26: 25 mg via INTRAVENOUS
  Filled 2022-08-26: qty 1

## 2022-08-26 MED ORDER — TRIAMCINOLONE ACETONIDE 0.1 % EX CREA
TOPICAL_CREAM | Freq: Once | CUTANEOUS | Status: DC
  Filled 2022-08-26: qty 15

## 2022-08-26 MED ORDER — METHYLPREDNISOLONE 4 MG PO TBPK: ORAL_TABLET | ORAL | 0 refills | Status: DC

## 2022-08-26 MED ORDER — ONDANSETRON HCL 4 MG/2ML IJ SOLN
4.0000 mg | Freq: Once | INTRAMUSCULAR | Status: AC
  Administered 2022-08-26: 4 mg via INTRAVENOUS
  Filled 2022-08-26: qty 2

## 2022-08-26 MED ORDER — METHYLPREDNISOLONE SODIUM SUCC 125 MG IJ SOLR
125.0000 mg | Freq: Once | INTRAMUSCULAR | Status: AC
  Administered 2022-08-26: 125 mg via INTRAVENOUS
  Filled 2022-08-26: qty 2

## 2022-08-26 MED ORDER — TRIAMCINOLONE ACETONIDE 0.1 % EX CREA: 1.0000 | TOPICAL_CREAM | Freq: Two times a day (BID) | CUTANEOUS | 0 refills | Status: DC

## 2022-08-26 NOTE — ED Provider Notes (Signed)
[facility] Provider Note  CSN: [id number] Arrival date & time: 08/26/22 1435  Chief Complaint(s) Allergic Reaction  HPI [name] is a 72 y.o. female with PMH CAD, depression, fibromyalgia, recent hospital admission for TIA and facial droop discharged on 08/22/2022 who presents emergency department for evaluation of a rash.  Patient states that she has a contrast allergy, but she received a contrasted CT angio study after receiving the premedication protocol and for 24 hours after the scan she had no symptoms.  However, on the morning of 08/23/2022, she awoke with a rash extending on both of her lower extremities, in the groin, on the chest and the back.  She states that the rash is significantly pruritic but she denies associated nausea, vomiting, abdominal pain, ocular pain, vaginal pain or other systemic symptoms.  She has had no associated wheezing, shortness of breath.  She arrives concerned that she is having allergic reaction to the contrast.  Of note, she was changed from lisinopril to valsartan upon discharge, right before the rash began.   Past Medical History Past Medical History:  Diagnosis Date  . Anxiety   . Arthritis   . Complication of anesthesia    difficult to wake up after anesthesia  . Coronary artery disease    LAD Cypher stent with PCI of the diagonal in 2004  . Depression   . Dyslipidemia   . Essential hypertension   . Fibromyalgia   . History of migraine   . Pre-diabetes    Patient Active Problem List   Diagnosis Date Noted  . TIA (transient ischemic attack) 08/21/2022  . Facial droop 08/21/2022  . Headache 08/21/2022  . Essential hypertension 08/21/2022  . Mixed hyperlipidemia 08/21/2022  . Fibromyalgia 08/21/2022  . CAD S/P percutaneous coronary angioplasty 08/21/2022  . Anxiety and depression 08/21/2022  . Carpal tunnel syndrome of right wrist   . Carpal tunnel syndrome, left upper limb   . Unilateral  primary osteoarthritis, right hip 06/01/2017  . Status post total replacement of right hip 06/01/2017  . Chronic right hip pain 04/09/2017  . History of total right knee replacement 04/09/2017  . Spondylolisthesis at L4-L5 level 01/30/2017  . Bruit 01/11/2017  . Preop cardiovascular exam 01/11/2017  . Coronary artery disease involving native heart with angina pectoris ([place]) 01/11/2017  . TENDINITIS, ELBOW 03/18/2008  . TOTAL KNEE FOLLOW-UP 09/24/2007  . Osteoarthrosis, unspecified whether generalized or localized, lower leg 04/12/2007  . KNEE PAIN 04/12/2007   Home Medication(s) Prior to Admission medications   Medication Sig Start Date End Date Taking? Authorizing Provider  acetaminophen (TYLENOL) 500 MG tablet Take 1,000 mg by mouth every 6 (six) hours as needed for mild pain or moderate pain.   Yes [provider]  ALPRAZolam Duanne Moron) 0.5 MG tablet Take 0.5 mg by mouth 3 (three) times daily as needed for anxiety.   Yes [provider]  amLODipine (NORVASC) 10 MG tablet Take 10 mg by mouth daily.   Yes [provider]  aspirin EC 81 MG tablet Take 1 tablet (81 mg total) by mouth daily with breakfast. - take Aspirin 81 mg daily along with Plavix 75 mg daily for 21 days then after that STOP the Aspirin and continue ONLY Plavix 75 mg daily indefinitely--for secondary stroke Prevention 08/22/22 08/22/23 Yes Emokpae, Courage, MD  atorvastatin (LIPITOR) 20 MG tablet Take 1 tablet (20 mg total) by mouth daily. 08/22/22 08/22/23 Yes Roxan Hockey, MD  cholecalciferol (VITAMIN D) 1000  units tablet Take 1,000 Units by mouth daily.   Yes [provider]  clobetasol (TEMOVATE) 0.05 % external solution Apply 1 application  topically 2 (two) times daily as needed (rash/irritation). scalp   Yes [provider]  clopidogrel (PLAVIX) 75 MG tablet Take 1 tablet (75 mg total) by mouth daily. - take Aspirin 81 mg daily along with Plavix 75 mg daily for 21 days then  after that STOP the Aspirin and continue ONLY Plavix 75 mg daily indefinitely--for secondary stroke Prevention 08/22/22 08/22/23 Yes Emokpae, Courage, MD  cyclobenzaprine (FLEXERIL) 10 MG tablet Take 10 mg by mouth 3 (three) times daily as needed for muscle spasms.   Yes [provider]  diphenhydrAMINE (BENADRYL) 25 mg capsule Take 25 mg by mouth every 6 (six) hours as needed for itching or allergies.   Yes [provider]  furosemide (LASIX) 40 MG tablet Take 40 mg by mouth daily.   Yes [provider]  gabapentin (NEURONTIN) 400 MG capsule Take 800 mg by mouth 2 (two) times daily.   Yes [provider]  Multiple Vitamins-Minerals (MULTIVITAMIN WITH MINERALS) tablet Take 1 tablet by mouth daily. Women's One-A-Day   Yes [provider]  nitroGLYCERIN (NITROSTAT) 0.4 MG SL tablet Place 1 tablet (0.4 mg total) under the tongue every 5 (five) minutes x 3 doses as needed for chest pain (if no relief after 3rd dose, proceed to the ED or call 911). 10/22/18  Yes Satira Sark, MD  sertraline (ZOLOFT) 25 MG tablet Take 25 mg by mouth daily. 03/13/22  Yes [provider]  traMADol (ULTRAM) 50 MG tablet One tablet every six hours as needed for pain. Patient taking differently: Take 50 mg by mouth every 6 (six) hours as needed for moderate pain. One tablet every six hours as needed for pain. 10/28/19  Yes Sanjuana Kava, MD  triamcinolone (KENALOG) 0.1 % paste Use as directed 1 application in the mouth or throat 2 (two) times daily as needed (mouth ulcers).   Yes [provider]  valsartan (DIOVAN) 80 MG tablet Take 1 tablet (80 mg total) by mouth daily. In place of Lisinopril/HCTZ 08/22/22  Yes Roxan Hockey, MD                                                                                                                                    Past Surgical History Past Surgical History:  Procedure Laterality Date  . ABDOMINAL HYSTERECTOMY      partial first, then full  . BACK SURGERY     01-30-2017 [facility]   . CARDIAC CATHETERIZATION    . CARPAL TUNNEL RELEASE Right   . CARPAL TUNNEL RELEASE Left 04/11/2022   Procedure: CARPAL TUNNEL RELEASE;  Surgeon: Carole Civil, MD;  Location: [facility];  Service: Orthopedics;  Laterality: Left;  . CHOLECYSTECTOMY    . COLONOSCOPY    . CORONARY ANGIOPLASTY  2004  .  REPLACEMENT TOTAL KNEE Right   . SHOULDER ARTHROSCOPY Right   . TOTAL HIP ARTHROPLASTY Left   . TOTAL HIP ARTHROPLASTY Right 06/01/2017   Procedure: RIGHT TOTAL HIP ARTHROPLASTY ANTERIOR APPROACH;  Surgeon: Mcarthur Rossetti, MD;  Location: [facility];  Service: Orthopedics;  Laterality: Right;  Needs RNFA  . TUBAL LIGATION     Family History Family History  Problem Relation Age of Onset  . Heart attack Father 66  . Lung cancer Brother     Social History Social History   Tobacco Use  . Smoking status: Former    Packs/day: 0.75    Years: 45.00    Total pack years: 33.75    Types: Cigarettes    Quit date: 01/30/2017    Years since quitting: 5.5  . Smokeless tobacco: Never  Vaping Use  . Vaping Use: Never used  Substance Use Topics  . Alcohol use: Yes    Comment: occasional glass of wine  . Drug use: No   Allergies Iodine, Iohexol, Ambien [zolpidem], Adhesive [tape], Niacin, and Penicillins  Review of Systems Review of Systems  Skin:  Positive for rash.    Physical Exam Vital Signs  I have reviewed the triage vital signs BP (!) 158/69   Pulse 75   Temp 98.2 F (36.8 C) (Oral)   Resp 18   SpO2 97%   Physical Exam Vitals and nursing note reviewed.  Constitutional:      General: She is not in acute distress.    Appearance: She is well-developed.  HENT:     Head: Normocephalic and atraumatic.  Eyes:     Conjunctiva/sclera: Conjunctivae normal.  Cardiovascular:     Rate and Rhythm: Normal rate and regular rhythm.     Heart sounds: No murmur heard. Pulmonary:     Effort: Pulmonary effort is  normal. No respiratory distress.     Breath sounds: Normal breath sounds.  Abdominal:     Palpations: Abdomen is soft.     Tenderness: There is no abdominal tenderness.  Musculoskeletal:        General: No swelling.     Cervical back: Neck supple.  Skin:    General: Skin is warm and dry.     Capillary Refill: Capillary refill takes less than 2 seconds.     Findings: Rash present.  Neurological:     Mental Status: She is alert.  Psychiatric:        Mood and Affect: Mood normal.            ED Results and Treatments Labs (all labs ordered are listed, but only abnormal results are displayed) Labs Reviewed  COMPREHENSIVE METABOLIC PANEL - Abnormal; Notable for the following components:      Result Value   Glucose, Bld 112 (*)    Creatinine, Ser 1.34 (*)    GFR, Estimated 42 (*)    All other components within normal limits  CBC WITH DIFFERENTIAL/PLATELET - Abnormal; Notable for the following components:   WBC 13.1 (*)    Platelets 450 (*)    Neutro Abs 9.7 (*)    Eosinophils Absolute 0.9 (*)    All other components within normal limits  SEDIMENTATION RATE  CK  C-REACTIVE PROTEIN  Radiology No results found.  Pertinent labs & imaging results that were available during my care of the patient were reviewed by me and considered in my medical decision making (see MDM for details).  Medications Ordered in ED Medications  diphenhydrAMINE (BENADRYL) injection 25 mg (25 mg Intravenous Given 08/26/22 1616)  methylPREDNISolone sodium succinate (SOLU-MEDROL) 125 mg/2 mL injection 125 mg (125 mg Intravenous Given 08/26/22 1617)  ondansetron (ZOFRAN) injection 4 mg (4 mg Intravenous Given 08/26/22 1712)                                                                                                                                      Procedures Procedures  (including critical care time)  Medical Decision Making / ED Course   This patient presents to the ED for concern of rash, this involves an extensive number of treatment options, and is a complaint that carries with it a high risk of complications and morbidity.  The differential diagnosis includes fixed drug eruption, dress syndrome, drug rash, urticaria, coagulopathy with petechia  MDM: Patient seen emergency room for evaluation of a rash.  Physical exam with a morbilliform rash with erythematous papules on the lower extremities that coalesce in the intertrigo regions of the groin and over the entirety of the back.  There are also scattered erythematous papules over the chest and into the neck.  No palm or sole involvement.  No mucosal involvement.  Laboratory evaluation with a creatinine elevation to 1.34, leukocytosis to 13.1 but is otherwise unremarkable.  Patient received 125 of methylprednisolone and 25 of IV Benadryl which led to improvement of the itching but rash remained the same.  I think it is highly unlikely that this is a contrast allergy given the patient had 24 hours without any symptoms after administration of contrast and this rash does not appear to be urticarial.  I spoke with dermatology resident on-call at [facility] Dr. Zigmund Daniel who is recommending topical Kenalog, discontinuation of her new ARB, and they will follow the patient outpatient next week.  Patient then discharged with steroids and Atarax for itching.   Additional history obtained:  -External records from outside source obtained and reviewed including: Chart review including previous notes, labs, imaging, consultation notes   Lab Tests: -I ordered, reviewed, and interpreted labs.   The pertinent results include:   Labs Reviewed  COMPREHENSIVE METABOLIC PANEL - Abnormal; Notable for the following components:      Result Value   Glucose, Bld 112 (*)    Creatinine, Ser 1.34 (*)     GFR, Estimated 42 (*)    All other components within normal limits  CBC WITH DIFFERENTIAL/PLATELET - Abnormal; Notable for the following components:   WBC 13.1 (*)    Platelets 450 (*)    Neutro Abs 9.7 (*)    Eosinophils Absolute 0.9 (*)    All other components within normal limits  SEDIMENTATION RATE  CK  C-REACTIVE PROTEIN      Medicines ordered and prescription drug management: Meds ordered this encounter  Medications  . diphenhydrAMINE (BENADRYL) injection 25 mg  . methylPREDNISolone sodium succinate (SOLU-MEDROL) 125 mg/2 mL injection 125 mg  . ondansetron (ZOFRAN) injection 4 mg    -I have reviewed the patients home medicines and have made adjustments as needed  Critical interventions none  Consultations Obtained: I requested consultation with the dermatologic resident Dr. Zigmund Daniel,  and discussed lab and imaging findings as well as pertinent plan - they recommend: Discontinuation of ARB, topical Kenalog, outpatient follow-up   Cardiac Monitoring: The patient was maintained on a cardiac monitor.  I personally viewed and interpreted the cardiac monitored which showed an underlying rhythm of: NSR  Social Determinants of Health:  Factors impacting patients care include: none   Reevaluation: After the interventions noted above, I reevaluated the patient and found that they have :stayed the same  Co morbidities that complicate the patient evaluation . Past Medical History:  Diagnosis Date  . Anxiety   . Arthritis   . Complication of anesthesia    difficult to wake up after anesthesia  . Coronary artery disease    LAD Cypher stent with PCI of the diagonal in 2004  . Depression   . Dyslipidemia   . Essential hypertension   . Fibromyalgia   . History of migraine   . Pre-diabetes       Dispostion: I considered admission for this patient, ***     Final Clinical Impression(s) / ED Diagnoses Final diagnoses:  None     '@PCDICTATION'$ @

## 2022-08-26 NOTE — Discharge Instructions (Addendum)
You were seen in the emergency room for evaluation of a rash.  You will be discharged with some medications to help address this and the [facility] dermatology providers will reach out to you on Monday to schedule an urgent follow-up.  Please return the emergency department if you notice this rash extending into your eyes, vagina or in your mouth.

## 2022-08-26 NOTE — ED Notes (Signed)
Pt given cola 

## 2022-08-26 NOTE — ED Triage Notes (Signed)
Patient seen in ED on 1/29 for stroke like symptoms. States that she had CT scan that day and is allergic to contrast media. Was pre-medicated prior to test and discharged. States that on Wednesday she noticed that she was broken out in hives all throughout body. Seen at [facility] and told to come here immediately. Has been taking Benadryl for itching. States that she developed dizziness yesterday which seems to be getting worse. Speaks in complete sentences with intact airway. Started Plavix this week.

## 2022-08-28 DIAGNOSIS — I10 Essential (primary) hypertension: Secondary | ICD-10-CM | POA: Diagnosis not present

## 2022-08-28 DIAGNOSIS — L509 Urticaria, unspecified: Secondary | ICD-10-CM | POA: Diagnosis not present

## 2022-08-28 DIAGNOSIS — G459 Transient cerebral ischemic attack, unspecified: Secondary | ICD-10-CM | POA: Diagnosis not present

## 2022-08-28 DIAGNOSIS — M797 Fibromyalgia: Secondary | ICD-10-CM | POA: Diagnosis not present

## 2022-09-20 DIAGNOSIS — L570 Actinic keratosis: Secondary | ICD-10-CM | POA: Diagnosis not present

## 2022-09-20 DIAGNOSIS — L281 Prurigo nodularis: Secondary | ICD-10-CM | POA: Diagnosis not present

## 2022-09-21 DIAGNOSIS — G459 Transient cerebral ischemic attack, unspecified: Secondary | ICD-10-CM | POA: Diagnosis not present

## 2022-09-21 DIAGNOSIS — M797 Fibromyalgia: Secondary | ICD-10-CM | POA: Diagnosis not present

## 2022-09-21 DIAGNOSIS — L509 Urticaria, unspecified: Secondary | ICD-10-CM | POA: Diagnosis not present

## 2022-09-21 DIAGNOSIS — I10 Essential (primary) hypertension: Secondary | ICD-10-CM | POA: Diagnosis not present

## 2022-09-26 DIAGNOSIS — N183 Chronic kidney disease, stage 3 unspecified: Secondary | ICD-10-CM | POA: Diagnosis not present

## 2022-09-26 DIAGNOSIS — R252 Cramp and spasm: Secondary | ICD-10-CM | POA: Diagnosis not present

## 2022-09-26 DIAGNOSIS — E7800 Pure hypercholesterolemia, unspecified: Secondary | ICD-10-CM | POA: Diagnosis not present

## 2022-09-26 DIAGNOSIS — E559 Vitamin D deficiency, unspecified: Secondary | ICD-10-CM | POA: Diagnosis not present

## 2022-09-26 DIAGNOSIS — R7303 Prediabetes: Secondary | ICD-10-CM | POA: Diagnosis not present

## 2022-09-26 DIAGNOSIS — R739 Hyperglycemia, unspecified: Secondary | ICD-10-CM | POA: Diagnosis not present

## 2022-09-26 DIAGNOSIS — I10 Essential (primary) hypertension: Secondary | ICD-10-CM | POA: Diagnosis not present

## 2022-10-03 DIAGNOSIS — Z23 Encounter for immunization: Secondary | ICD-10-CM | POA: Diagnosis not present

## 2022-10-03 DIAGNOSIS — I10 Essential (primary) hypertension: Secondary | ICD-10-CM | POA: Diagnosis not present

## 2022-10-03 DIAGNOSIS — M797 Fibromyalgia: Secondary | ICD-10-CM | POA: Diagnosis not present

## 2022-10-03 DIAGNOSIS — E559 Vitamin D deficiency, unspecified: Secondary | ICD-10-CM | POA: Diagnosis not present

## 2022-10-04 DIAGNOSIS — L281 Prurigo nodularis: Secondary | ICD-10-CM | POA: Diagnosis not present

## 2022-10-04 DIAGNOSIS — L570 Actinic keratosis: Secondary | ICD-10-CM | POA: Diagnosis not present

## 2022-11-09 DIAGNOSIS — L281 Prurigo nodularis: Secondary | ICD-10-CM | POA: Diagnosis not present

## 2022-12-10 ENCOUNTER — Other Ambulatory Visit: Payer: Self-pay

## 2022-12-10 ENCOUNTER — Inpatient Hospital Stay (HOSPITAL_COMMUNITY)
Admission: EM | Admit: 2022-12-10 | Discharge: 2022-12-13 | DRG: 871 | Disposition: A | Discharge status: Home / Self Care | Payer: Medicare HMO | Attending: Internal Medicine | Admitting: Internal Medicine

## 2022-12-10 ENCOUNTER — Emergency Department (HOSPITAL_COMMUNITY): Payer: Medicare HMO

## 2022-12-10 ENCOUNTER — Encounter (HOSPITAL_COMMUNITY): Payer: Self-pay

## 2022-12-10 DIAGNOSIS — G459 Transient cerebral ischemic attack, unspecified: Secondary | ICD-10-CM | POA: Diagnosis present

## 2022-12-10 DIAGNOSIS — R197 Diarrhea, unspecified: Secondary | ICD-10-CM | POA: Diagnosis present

## 2022-12-10 DIAGNOSIS — E876 Hypokalemia: Secondary | ICD-10-CM | POA: Diagnosis present

## 2022-12-10 DIAGNOSIS — R569 Unspecified convulsions: Secondary | ICD-10-CM | POA: Diagnosis not present

## 2022-12-10 DIAGNOSIS — Z91048 Other nonmedicinal substance allergy status: Secondary | ICD-10-CM

## 2022-12-10 DIAGNOSIS — Z888 Allergy status to other drugs, medicaments and biological substances status: Secondary | ICD-10-CM

## 2022-12-10 DIAGNOSIS — F419 Anxiety disorder, unspecified: Secondary | ICD-10-CM | POA: Diagnosis present

## 2022-12-10 DIAGNOSIS — A419 Sepsis, unspecified organism: Principal | ICD-10-CM | POA: Insufficient documentation

## 2022-12-10 DIAGNOSIS — Z7902 Long term (current) use of antithrombotics/antiplatelets: Secondary | ICD-10-CM

## 2022-12-10 DIAGNOSIS — E871 Hypo-osmolality and hyponatremia: Secondary | ICD-10-CM | POA: Diagnosis present

## 2022-12-10 DIAGNOSIS — E782 Mixed hyperlipidemia: Secondary | ICD-10-CM | POA: Diagnosis present

## 2022-12-10 DIAGNOSIS — M1990 Unspecified osteoarthritis, unspecified site: Secondary | ICD-10-CM | POA: Diagnosis present

## 2022-12-10 DIAGNOSIS — F32A Depression, unspecified: Secondary | ICD-10-CM | POA: Diagnosis present

## 2022-12-10 DIAGNOSIS — R918 Other nonspecific abnormal finding of lung field: Secondary | ICD-10-CM | POA: Diagnosis not present

## 2022-12-10 DIAGNOSIS — Z8673 Personal history of transient ischemic attack (TIA), and cerebral infarction without residual deficits: Secondary | ICD-10-CM

## 2022-12-10 DIAGNOSIS — M797 Fibromyalgia: Secondary | ICD-10-CM | POA: Diagnosis present

## 2022-12-10 DIAGNOSIS — R531 Weakness: Secondary | ICD-10-CM | POA: Diagnosis not present

## 2022-12-10 DIAGNOSIS — W19XXXA Unspecified fall, initial encounter: Secondary | ICD-10-CM | POA: Diagnosis present

## 2022-12-10 DIAGNOSIS — J189 Pneumonia, unspecified organism: Secondary | ICD-10-CM | POA: Diagnosis present

## 2022-12-10 DIAGNOSIS — R6520 Severe sepsis without septic shock: Secondary | ICD-10-CM | POA: Diagnosis present

## 2022-12-10 DIAGNOSIS — G4489 Other headache syndrome: Secondary | ICD-10-CM | POA: Diagnosis not present

## 2022-12-10 DIAGNOSIS — F17290 Nicotine dependence, other tobacco product, uncomplicated: Secondary | ICD-10-CM | POA: Diagnosis present

## 2022-12-10 DIAGNOSIS — Z6834 Body mass index (BMI) 34.0-34.9, adult: Secondary | ICD-10-CM

## 2022-12-10 DIAGNOSIS — Z90710 Acquired absence of both cervix and uterus: Secondary | ICD-10-CM

## 2022-12-10 DIAGNOSIS — J9601 Acute respiratory failure with hypoxia: Secondary | ICD-10-CM | POA: Diagnosis not present

## 2022-12-10 DIAGNOSIS — Z634 Disappearance and death of family member: Secondary | ICD-10-CM

## 2022-12-10 DIAGNOSIS — E66811 Obesity, class 1: Secondary | ICD-10-CM

## 2022-12-10 DIAGNOSIS — Z880 Allergy status to penicillin: Secondary | ICD-10-CM

## 2022-12-10 DIAGNOSIS — I10 Essential (primary) hypertension: Secondary | ICD-10-CM | POA: Diagnosis present

## 2022-12-10 DIAGNOSIS — R509 Fever, unspecified: Secondary | ICD-10-CM | POA: Diagnosis not present

## 2022-12-10 DIAGNOSIS — Y92003 Bedroom of unspecified non-institutional (private) residence as the place of occurrence of the external cause: Secondary | ICD-10-CM

## 2022-12-10 DIAGNOSIS — R519 Headache, unspecified: Secondary | ICD-10-CM | POA: Diagnosis present

## 2022-12-10 DIAGNOSIS — R55 Syncope and collapse: Secondary | ICD-10-CM | POA: Diagnosis not present

## 2022-12-10 DIAGNOSIS — M62838 Other muscle spasm: Secondary | ICD-10-CM | POA: Diagnosis present

## 2022-12-10 DIAGNOSIS — Z7982 Long term (current) use of aspirin: Secondary | ICD-10-CM

## 2022-12-10 DIAGNOSIS — E669 Obesity, unspecified: Secondary | ICD-10-CM | POA: Diagnosis present

## 2022-12-10 DIAGNOSIS — J440 Chronic obstructive pulmonary disease with acute lower respiratory infection: Secondary | ICD-10-CM | POA: Diagnosis present

## 2022-12-10 DIAGNOSIS — I2510 Atherosclerotic heart disease of native coronary artery without angina pectoris: Secondary | ICD-10-CM | POA: Diagnosis present

## 2022-12-10 DIAGNOSIS — Z886 Allergy status to analgesic agent status: Secondary | ICD-10-CM

## 2022-12-10 DIAGNOSIS — E860 Dehydration: Secondary | ICD-10-CM | POA: Diagnosis present

## 2022-12-10 DIAGNOSIS — Z1152 Encounter for screening for COVID-19: Secondary | ICD-10-CM

## 2022-12-10 DIAGNOSIS — Z79899 Other long term (current) drug therapy: Secondary | ICD-10-CM

## 2022-12-10 DIAGNOSIS — R7303 Prediabetes: Secondary | ICD-10-CM | POA: Diagnosis present

## 2022-12-10 DIAGNOSIS — J441 Chronic obstructive pulmonary disease with (acute) exacerbation: Secondary | ICD-10-CM

## 2022-12-10 DIAGNOSIS — Z801 Family history of malignant neoplasm of trachea, bronchus and lung: Secondary | ICD-10-CM

## 2022-12-10 DIAGNOSIS — R0603 Acute respiratory distress: Secondary | ICD-10-CM | POA: Diagnosis not present

## 2022-12-10 DIAGNOSIS — J181 Lobar pneumonia, unspecified organism: Secondary | ICD-10-CM | POA: Insufficient documentation

## 2022-12-10 DIAGNOSIS — Z96651 Presence of right artificial knee joint: Secondary | ICD-10-CM | POA: Diagnosis present

## 2022-12-10 DIAGNOSIS — Z8249 Family history of ischemic heart disease and other diseases of the circulatory system: Secondary | ICD-10-CM

## 2022-12-10 DIAGNOSIS — Z955 Presence of coronary angioplasty implant and graft: Secondary | ICD-10-CM | POA: Diagnosis not present

## 2022-12-10 DIAGNOSIS — Z9109 Other allergy status, other than to drugs and biological substances: Secondary | ICD-10-CM

## 2022-12-10 DIAGNOSIS — G9341 Metabolic encephalopathy: Secondary | ICD-10-CM | POA: Diagnosis present

## 2022-12-10 DIAGNOSIS — Z96643 Presence of artificial hip joint, bilateral: Secondary | ICD-10-CM | POA: Diagnosis present

## 2022-12-10 DIAGNOSIS — I25119 Atherosclerotic heart disease of native coronary artery with unspecified angina pectoris: Secondary | ICD-10-CM | POA: Diagnosis present

## 2022-12-10 LAB — CBC WITH DIFFERENTIAL/PLATELET
Abs Immature Granulocytes: 0.14 10*3/uL — ABNORMAL HIGH (ref 0.00–0.07)
Basophils Absolute: 0.1 10*3/uL (ref 0.0–0.1)
Basophils Relative: 0 %
Eosinophils Absolute: 0 10*3/uL (ref 0.0–0.5)
Eosinophils Relative: 0 %
HCT: 33.8 % — ABNORMAL LOW (ref 36.0–46.0)
Hemoglobin: 11.4 g/dL — ABNORMAL LOW (ref 12.0–15.0)
Immature Granulocytes: 1 %
Lymphocytes Relative: 9 %
Lymphs Abs: 2.1 10*3/uL (ref 0.7–4.0)
MCH: 29.8 pg (ref 26.0–34.0)
MCHC: 33.7 g/dL (ref 30.0–36.0)
MCV: 88.3 fL (ref 80.0–100.0)
Monocytes Absolute: 2 10*3/uL — ABNORMAL HIGH (ref 0.1–1.0)
Monocytes Relative: 8 %
Neutro Abs: 19.8 10*3/uL — ABNORMAL HIGH (ref 1.7–7.7)
Neutrophils Relative %: 82 %
Platelets: 369 10*3/uL (ref 150–400)
RBC: 3.83 MIL/uL — ABNORMAL LOW (ref 3.87–5.11)
RDW: 12.2 % (ref 11.5–15.5)
WBC: 24.1 10*3/uL — ABNORMAL HIGH (ref 4.0–10.5)
nRBC: 0 % (ref 0.0–0.2)

## 2022-12-10 LAB — GROUP A STREP BY PCR: Group A Strep by PCR: NOT DETECTED

## 2022-12-10 LAB — SARS CORONAVIRUS 2 BY RT PCR: SARS Coronavirus 2 by RT PCR: NEGATIVE

## 2022-12-10 LAB — COMPREHENSIVE METABOLIC PANEL
ALT: 19 U/L (ref 0–44)
AST: 28 U/L (ref 15–41)
Albumin: 3.2 g/dL — ABNORMAL LOW (ref 3.5–5.0)
Alkaline Phosphatase: 52 U/L (ref 38–126)
Anion gap: 10 (ref 5–15)
BUN: 13 mg/dL (ref 8–23)
CO2: 23 mmol/L (ref 22–32)
Calcium: 8.4 mg/dL — ABNORMAL LOW (ref 8.9–10.3)
Chloride: 98 mmol/L (ref 98–111)
Creatinine, Ser: 0.81 mg/dL (ref 0.44–1.00)
GFR, Estimated: >60 mL/min (ref >60)
Glucose, Bld: 153 mg/dL — ABNORMAL HIGH (ref 70–99)
Potassium: 3.2 mmol/L — ABNORMAL LOW (ref 3.5–5.1)
Sodium: 131 mmol/L — ABNORMAL LOW (ref 135–145)
Total Bilirubin: 1.1 mg/dL (ref 0.3–1.2)
Total Protein: 6.4 g/dL — ABNORMAL LOW (ref 6.5–8.1)

## 2022-12-10 LAB — URINALYSIS, W/ REFLEX TO CULTURE (INFECTION SUSPECTED)
Bacteria, UA: NONE SEEN
Bilirubin Urine: NEGATIVE
Glucose, UA: NEGATIVE mg/dL
Hgb urine dipstick: NEGATIVE
Ketones, ur: NEGATIVE mg/dL
Nitrite: NEGATIVE
Protein, ur: 100 mg/dL — AB
RBC / HPF: NONE SEEN RBC/hpf (ref 0–5)
Specific Gravity, Urine: 1.01 (ref 1.005–1.030)
pH: 6.5 (ref 5.0–8.0)

## 2022-12-10 LAB — PROTIME-INR
INR: 1.2 (ref 0.8–1.2)
Prothrombin Time: 15.4 seconds — ABNORMAL HIGH (ref 11.4–15.2)

## 2022-12-10 LAB — APTT: aPTT: 32 seconds (ref 24–36)

## 2022-12-10 LAB — LACTIC ACID, PLASMA
Lactic Acid, Venous: 1.5 mmol/L (ref 0.5–1.9)
Lactic Acid, Venous: 1.2 mmol/L (ref 0.5–1.9)

## 2022-12-10 MED ORDER — NITROGLYCERIN 0.4 MG SL SUBL: 0.4000 mg | SUBLINGUAL_TABLET | SUBLINGUAL | Status: DC | PRN

## 2022-12-10 MED ORDER — LACTATED RINGERS IV BOLUS
1000.0000 mL | Freq: Once | INTRAVENOUS | Status: AC
  Administered 2022-12-10: 1000 mL via INTRAVENOUS

## 2022-12-10 MED ORDER — SODIUM CHLORIDE 0.9 % IV SOLN
1.0000 g | Freq: Once | INTRAVENOUS | Status: AC
  Administered 2022-12-10: 1 g via INTRAVENOUS
  Filled 2022-12-10: qty 10

## 2022-12-10 MED ORDER — POTASSIUM CHLORIDE IN NACL 20-0.9 MEQ/L-% IV SOLN
INTRAVENOUS | Status: DC
  Filled 2022-12-10: qty 1000

## 2022-12-10 MED ORDER — ASPIRIN 81 MG PO TBEC
81.0000 mg | DELAYED_RELEASE_TABLET | Freq: Every day | ORAL | Status: DC
  Administered 2022-12-11: 81 mg via ORAL
  Filled 2022-12-10: qty 1

## 2022-12-10 MED ORDER — CLOBETASOL PROPIONATE 0.05 % EX SOLN
1.0000 | Freq: Two times a day (BID) | CUTANEOUS | Status: DC | PRN
  Administered 2022-12-12: 1 via TOPICAL

## 2022-12-10 MED ORDER — CLOPIDOGREL BISULFATE 75 MG PO TABS
75.0000 mg | ORAL_TABLET | Freq: Every day | ORAL | Status: DC
  Administered 2022-12-11 – 2022-12-13 (×3): 75 mg via ORAL
  Filled 2022-12-10 (×4): qty 1

## 2022-12-10 MED ORDER — ENOXAPARIN SODIUM 40 MG/0.4ML IJ SOSY
40.0000 mg | PREFILLED_SYRINGE | INTRAMUSCULAR | Status: DC
  Administered 2022-12-10 – 2022-12-12 (×3): 40 mg via SUBCUTANEOUS
  Filled 2022-12-10 (×3): qty 0.4

## 2022-12-10 MED ORDER — ATORVASTATIN CALCIUM 10 MG PO TABS
20.0000 mg | ORAL_TABLET | Freq: Every day | ORAL | Status: DC
  Administered 2022-12-10 – 2022-12-13 (×4): 20 mg via ORAL
  Filled 2022-12-10 (×4): qty 2

## 2022-12-10 MED ORDER — SERTRALINE HCL 50 MG PO TABS
25.0000 mg | ORAL_TABLET | Freq: Every day | ORAL | Status: DC
  Administered 2022-12-10 – 2022-12-13 (×4): 25 mg via ORAL
  Filled 2022-12-10 (×4): qty 1

## 2022-12-10 MED ORDER — ALPRAZOLAM 0.5 MG PO TABS
0.5000 mg | ORAL_TABLET | Freq: Three times a day (TID) | ORAL | Status: DC | PRN
  Administered 2022-12-11 – 2022-12-13 (×6): 0.5 mg via ORAL
  Filled 2022-12-10 (×6): qty 1

## 2022-12-10 MED ORDER — NYSTATIN 100000 UNIT/ML MT SUSP
5.0000 mL | Freq: Four times a day (QID) | OROMUCOSAL | Status: DC
  Administered 2022-12-10 – 2022-12-13 (×11): 500000 [IU] via ORAL
  Filled 2022-12-10 (×11): qty 5

## 2022-12-10 MED ORDER — SODIUM CHLORIDE 0.9 % IV SOLN
1.0000 g | INTRAVENOUS | Status: DC
  Administered 2022-12-12 – 2022-12-13 (×2): 1 g via INTRAVENOUS
  Filled 2022-12-10 (×3): qty 10

## 2022-12-10 MED ORDER — SODIUM CHLORIDE 0.9 % IV SOLN
500.0000 mg | INTRAVENOUS | Status: DC
  Administered 2022-12-10 – 2022-12-12 (×3): 500 mg via INTRAVENOUS
  Filled 2022-12-10 (×3): qty 5

## 2022-12-10 MED ORDER — TRAMADOL HCL 50 MG PO TABS
50.0000 mg | ORAL_TABLET | Freq: Four times a day (QID) | ORAL | Status: DC | PRN
  Administered 2022-12-11 – 2022-12-12 (×4): 50 mg via ORAL
  Filled 2022-12-10 (×4): qty 1

## 2022-12-10 NOTE — H&P (Addendum)
History and Physical:    [name]   ZOX:[id number] DOB: 07/05/1951 DOA: 12/10/2022  Referring MD/provider: Luther Hearing PCP: Estanislado Pandy, MD   Patient coming from: Home  Chief Complaint: Weakness/fever  History of Present Illness:   [name] is an 72 y.o. female with a PMH of CAD status post stent, hyperlipidemia, hypertension, fibromyalgia, prediabetes, and migraines who presented to the ED with a chief complaint of generalized weakness associated with fever.  [name] is accompanied by her granddaughter who provides much of the history.  According to [name]'s granddaughter, [name] has been complaining of her sinuses bothering her for about 1 week but did not want to see her PCP.  She apparently had a syncopal episode and fell and was found down.  [name] does not remember falling down.  According to her granddaughter, she was confused and disoriented and did not recognize her granddaughter and was talking about her son, who passed away approximately 2 weeks ago, as if he were present.  She had not had anything to eat prior to this happening.  Her granddaughter reports that she was hospitalized for a TIA back in January, and that she takes Zoloft and Xanax for depression.  The patient reports that she has had a nonproductive cough but no shortness of breath or chest pain.  She has felt weak.  Denies any weight loss and reports that her appetite has been okay.  She did not bring her medications with her but notes that she is on Plavix.  [name] lives with her 83 year old mother for whom she provides care.  She is interested in obtaining paperwork for living will/POA as she does not currently have these documents.  She denies active smoking but she does vape.  ED Course: [name] had a temperature of 102 on arrival but was otherwise relatively hemodynamically stable and was given Rocephin/azithromycin and a fluid bolus.  Lactic acid was 1.5.  Chemistries were  notable for sodium of 131, potassium 3.2, glucose 153, calcium 8.4, total protein 6.4, albumin 3.2.  CBC showed a WBC of 24.1 with a left shift.  Hemoglobin was 11.4.  PT was 15.4, PTT 32.  Group A strep testing was negative.  Influenza, RSV and COVID testing was not performed.  CT of the head showed no acute intracranial abnormality and stable mild diffuse cerebral and cerebellar atrophy.  X-ray/CT of the chest showed a masslike consolidation within the superior segment of the left lower lobe consistent with round pneumonia.  Patient also had an 8 mm slightly spiculated pulmonary nodule with recommendations for repeat CT of the chest in 6-12 months.  ROS:   Review of Systems  Constitutional:  Positive for fever and malaise/fatigue.  HENT:  Positive for sinus pain.   Eyes: Negative.   Respiratory:  Positive for cough. Negative for sputum production and shortness of breath.   Cardiovascular: Negative.   Gastrointestinal:  Positive for diarrhea, nausea and vomiting.  Genitourinary: Negative.   Musculoskeletal:  Positive for falls.  Skin:  Positive for itching and rash.  Neurological:  Positive for weakness and headaches.  Endo/Heme/Allergies: Negative.   Psychiatric/Behavioral:  Positive for depression.     Past Medical History:   Past Medical History:  Diagnosis Date   Anxiety    Arthritis    Complication of anesthesia    difficult to wake up after anesthesia   Coronary artery disease    LAD Cypher stent with PCI of  the diagonal in 2004   Depression    Dyslipidemia    Essential hypertension    Fibromyalgia    History of migraine    Pre-diabetes     Past Surgical History:   Past Surgical History:  Procedure Laterality Date   ABDOMINAL HYSTERECTOMY     partial first, then full   BACK SURGERY     01-30-2017 [facility]    CARDIAC CATHETERIZATION     CARPAL TUNNEL RELEASE Right    CARPAL TUNNEL RELEASE Left 04/11/2022   Procedure: CARPAL TUNNEL RELEASE;  Surgeon: Vickki Hearing, MD;  Location: [facility];  Service: Orthopedics;  Laterality: Left;   CHOLECYSTECTOMY     COLONOSCOPY     CORONARY ANGIOPLASTY  2004   REPLACEMENT TOTAL KNEE Right    SHOULDER ARTHROSCOPY Right    TOTAL HIP ARTHROPLASTY Left    TOTAL HIP ARTHROPLASTY Right 06/01/2017   Procedure: RIGHT TOTAL HIP ARTHROPLASTY ANTERIOR APPROACH;  Surgeon: Kathryne Hitch, MD;  Location: [facility];  Service: Orthopedics;  Laterality: Right;  Needs RNFA   TUBAL LIGATION      Social History:   Social History   Socioeconomic History   Marital status: Married    Spouse name: Not on file   Number of children: 2   Years of education: Not on file   Highest education level: Not on file  Occupational History   Not on file  Tobacco Use   Smoking status: Former    Packs/day: 0.75    Years: 45.00    Additional pack years: 0.00    Total pack years: 33.75    Types: Cigarettes    Quit date: 01/30/2017    Years since quitting: 5.8   Smokeless tobacco: Never  Vaping Use   Vaping Use: Never used  Substance and Sexual Activity   Alcohol use: Yes    Comment: occasional glass of wine   Drug use: No   Sexual activity: Not on file  Other Topics Concern   Not on file  Social History Narrative   Lives with husband and mom.    Social Determinants of Health   Financial Resource Strain: Not on file  Food Insecurity: No Food Insecurity (08/21/2022)   Hunger Vital Sign    Worried About Running Out of Food in the Last Year: Never true    Ran Out of Food in the Last Year: Never true  Transportation Needs: No Transportation Needs (08/21/2022)   PRAPARE - Administrator, Civil Service (Medical): No    Lack of Transportation (Non-Medical): No  Physical Activity: Not on file  Stress: Not on file  Social Connections: Not on file  Intimate Partner Violence: Not At Risk (08/21/2022)   Humiliation, Afraid, Rape, and Kick questionnaire    Fear of Current or Ex-Partner: No    Emotionally Abused: No     Physically Abused: No    Sexually Abused: No    Allergies   Iodine, Iohexol, Ambien [zolpidem], Adhesive [tape], Niacin, and Penicillins  Family history:   Family History  Problem Relation Age of Onset   Heart attack Father 4   Lung cancer Brother     Current Medications:   Prior to Admission medications   Medication Sig Start Date End Date Taking? Authorizing Provider  acetaminophen (TYLENOL) 500 MG tablet Take 1,000 mg by mouth every 6 (six) hours as needed for mild pain or moderate pain.    [provider]  ALPRAZolam Prudy Feeler) 0.5 MG tablet  Take 0.5 mg by mouth 3 (three) times daily as needed for anxiety.    [provider]  amLODipine (NORVASC) 10 MG tablet Take 10 mg by mouth daily.    [provider]  aspirin EC 81 MG tablet Take 1 tablet (81 mg total) by mouth daily with breakfast. - take Aspirin 81 mg daily along with Plavix 75 mg daily for 21 days then after that STOP the Aspirin and continue ONLY Plavix 75 mg daily indefinitely--for secondary stroke Prevention 08/22/22 08/22/23  Shon Hale, MD  atorvastatin (LIPITOR) 20 MG tablet Take 1 tablet (20 mg total) by mouth daily. 08/22/22 08/22/23  Shon Hale, MD  cholecalciferol (VITAMIN D) 1000 units tablet Take 1,000 Units by mouth daily.    [provider]  clobetasol (TEMOVATE) 0.05 % external solution Apply 1 application  topically 2 (two) times daily as needed (rash/irritation). scalp    [provider]  clopidogrel (PLAVIX) 75 MG tablet Take 1 tablet (75 mg total) by mouth daily. - take Aspirin 81 mg daily along with Plavix 75 mg daily for 21 days then after that STOP the Aspirin and continue ONLY Plavix 75 mg daily indefinitely--for secondary stroke Prevention 08/22/22 08/22/23  Shon Hale, MD  cyclobenzaprine (FLEXERIL) 10 MG tablet Take 10 mg by mouth 3 (three) times daily as needed for muscle spasms.    [provider]  diphenhydrAMINE (BENADRYL) 25 mg  capsule Take 25 mg by mouth every 6 (six) hours as needed for itching or allergies.    [provider]  furosemide (LASIX) 40 MG tablet Take 40 mg by mouth daily.    [provider]  gabapentin (NEURONTIN) 400 MG capsule Take 800 mg by mouth 2 (two) times daily.    [provider]  hydrOXYzine (ATARAX) 25 MG tablet Take 1 tablet (25 mg total) by mouth every 6 (six) hours. 08/26/22   Kommor, Madison, MD  methylPREDNISolone (MEDROL DOSEPAK) 4 MG TBPK tablet Take as prescribed 08/26/22   Kommor, Madison, MD  Multiple Vitamins-Minerals (MULTIVITAMIN WITH MINERALS) tablet Take 1 tablet by mouth daily. Women's One-A-Day    [provider]  nitroGLYCERIN (NITROSTAT) 0.4 MG SL tablet Place 1 tablet (0.4 mg total) under the tongue every 5 (five) minutes x 3 doses as needed for chest pain (if no relief after 3rd dose, proceed to the ED or call 911). 10/22/18   Jonelle Sidle, MD  sertraline (ZOLOFT) 25 MG tablet Take 25 mg by mouth daily. 03/13/22   [provider]  traMADol (ULTRAM) 50 MG tablet One tablet every six hours as needed for pain. Patient taking differently: Take 50 mg by mouth every 6 (six) hours as needed for moderate pain. One tablet every six hours as needed for pain. 10/28/19   Darreld Mclean, MD  triamcinolone (KENALOG) 0.1 % paste Use as directed 1 application in the mouth or throat 2 (two) times daily as needed (mouth ulcers).    [provider]  triamcinolone cream (KENALOG) 0.1 % Apply 1 Application topically 2 (two) times daily. 08/26/22   Glendora Score, MD    Physical Exam:   Vitals:   12/10/22 1930 12/10/22 2000 12/10/22 2030 12/10/22 2036  BP: 107/81 (!) 133/43 (!) 141/59   Pulse: 60 (!) 56 (!) 59   Resp: 18 (!) 24 20   Temp:    97.9 F (36.6 C)  TempSrc:    Oral  SpO2: 96% 96% 98%   Weight:      Height:  Physical Exam: Blood pressure (!) 141/59, pulse (!) 59, temperature 97.9 F (36.6 C), temperature source  Oral, resp. rate 20, height 5\' 2"  (1.575 m), weight 82 kg, SpO2 98 %. Gen: No acute distress. Head: Normocephalic, atraumatic. Eyes: Pupils equal, round and reactive to light. Extraocular movements intact.  Sclerae nonicteric. No lid lag. Mouth: Oropharynx reveals white patches on the tongue concerning for thrush. Dentition is fair. Neck: Supple, no thyromegaly, no lymphadenopathy, no jugular venous distention. Chest: Lungs are clear to auscultation with good air movement. No rales, rhonchi or wheezes.  CV: Heart sounds are regular with an S1, S2. No murmurs, rubs, clicks, or gallops. Abdomen: Soft, nontender, nondistended with normal active bowel sounds. No hepatosplenomegaly or palpable masses. Extremities: Extremities are without clubbing, or cyanosis. No edema. Pedal pulses 2+.  Skin: Warm and dry.  Punctate scalp lesions that she describes as pruritic. Neuro: Alert and oriented times 3; grossly nonfocal.  Psych: Insight is good and judgment is appropriate. Mood and affect depressed/flat.   Data Review:    Labs: Basic Metabolic Panel: Recent Labs  Lab 12/10/22 1800  NA 131*  K 3.2*  CL 98  CO2 23  GLUCOSE 153*  BUN 13  CREATININE 0.81  CALCIUM 8.4*   Liver Function Tests: Recent Labs  Lab 12/10/22 1800  AST 28  ALT 19  ALKPHOS 52  BILITOT 1.1  PROT 6.4*  ALBUMIN 3.2*   CBC: Recent Labs  Lab 12/10/22 1800  WBC 24.1*  NEUTROABS 19.8*  HGB 11.4*  HCT 33.8*  MCV 88.3  PLT 369    Radiographic Studies: CT Chest Wo Contrast  Result Date: 12/10/2022 CLINICAL DATA:  Abnormal xray - lung nodule, >= 1 cm, fever EXAM: CT CHEST WITHOUT CONTRAST TECHNIQUE: Multidetector CT imaging of the chest was performed following the standard protocol without IV contrast. RADIATION DOSE REDUCTION: This exam was performed according to the departmental dose-optimization program which includes automated exposure control, adjustment of the mA and/or kV according to patient size and/or  use of iterative reconstruction technique. COMPARISON:  None Available. FINDINGS: Cardiovascular: Moderate multi-vessel coronary artery calcification with prior left anterior descending coronary artery stenting. Global cardiac size within normal limits. No pericardial effusion. Central pulmonary arteries are of normal caliber. Moderate atherosclerotic calcification within the thoracic aorta. No aortic aneurysm. Particularly prominent atherosclerotic calcifications seen at the origin of the left subclavian artery, however, the degree of stenosis is not well assessed on this noncontrast examination. Mediastinum/Nodes: Visualized thyroid is unremarkable. Shotty right paratracheal and prevascular lymphadenopathy is present, nonspecific, possibly reactive in nature. The esophagus is unremarkable. Lungs/Pleura: There is masslike consolidation within the superior segment of the left lower lobe which neither obliterates nor demonstrates significant mass effect upon the traversing bronchi most in keeping with an inflammatory process such as round pneumonia. Mean 8 mm slightly spiculated pulmonary nodule noted within the left lower lobe, axial image # 86/4, indeterminate. No pneumothorax or pleural effusion. No central obstructing lesion. Upper Abdomen: No acute abnormality.  Status post cholecystectomy Musculoskeletal: Osseous structures are age-appropriate. No acute bone abnormality. No lytic or blastic bone lesion IMPRESSION: 1. Masslike consolidation within the superior segment of the left lower lobe most in keeping with an inflammatory process such as round pneumonia in the acute setting. Follow-up chest radiograph is recommended, however, in 3-4 weeks to document complete resolution. 2. Mean 8 mm slightly spiculated pulmonary nodule within the left lower lobe, indeterminate. Non-contrast chest CT at 6-12 months is recommended. If the nodule is stable at  time of repeat CT, then future CT at 18-24 months (from today's  scan) is considered optional for low-risk patients, but is recommended for high-risk patients. This recommendation follows the consensus statement: Guidelines for Management of Incidental Pulmonary Nodules Detected on CT Images: From the Fleischner Society 2017; Radiology 2017; 284:228-243. 3. Moderate multi-vessel coronary artery calcification with prior left anterior descending coronary artery stenting. 4. Moderate atherosclerotic calcification within the thoracic aorta. Particularly prominent atherosclerotic calcifications seen at the origin of the left subclavian artery, however, the degree of stenosis is not well assessed on this noncontrast examination. If there is clinical evidence of subclavian steal, this could be better assessed with CT arteriography. Aortic Atherosclerosis (ICD10-I70.0). Electronically Signed   By: Helyn Numbers M.D.   On: 12/10/2022 19:46   DG Chest Port 1 View  Result Date: 12/10/2022 CLINICAL DATA:  Fever, questionable sepsis EXAM: PORTABLE CHEST 1 VIEW COMPARISON:  12/26/2010, 08/22/2022 FINDINGS: Single frontal view of the chest demonstrates a unremarkable cardiac silhouette. 6.1 cm rounded masslike area of consolidation projects in the left perihilar region. There is no abnormality in this region on the prior CTA neck performed 08/22/2022. The remainder of the lungs are clear. No effusion or pneumothorax. No acute bony abnormality. Stable postsurgical changes right shoulder. IMPRESSION: 1. 6.1 cm rounded masslike consolidation within the left hilar region. Differential diagnosis includes rounded pneumonia versus neoplasm. CT chest with IV contrast may be useful for further evaluation. Electronically Signed   By: Sharlet Salina M.D.   On: 12/10/2022 18:21   CT Head Wo Contrast  Result Date: 12/10/2022 CLINICAL DATA:  Severe headache.  Fever. EXAM: CT HEAD WITHOUT CONTRAST TECHNIQUE: Contiguous axial images were obtained from the base of the skull through the vertex without  intravenous contrast. RADIATION DOSE REDUCTION: This exam was performed according to the departmental dose-optimization program which includes automated exposure control, adjustment of the mA and/or kV according to patient size and/or use of iterative reconstruction technique. COMPARISON:  08/22/2022 FINDINGS: Brain: No evidence of intracranial hemorrhage, acute infarction, hydrocephalus, extra-axial collection, or mass lesion/mass effect. Mild diffuse cerebral and cerebellar atrophy shows no significant change. Vascular:  No hyperdense vessel or other acute findings. Skull: No evidence of fracture or other significant bone abnormality. Sinuses/Orbits:  No acute findings. Other: None. IMPRESSION: No acute intracranial abnormality. Stable mild diffuse cerebral and cerebellar atrophy. Electronically Signed   By: Danae Orleans M.D.   On: 12/10/2022 18:18    EKG: Independently reviewed.  Sinus rhythm at 60 bpm with no obvious ST-T wave changes.   Assessment/Plan:   Principal Problem:   CAP (community acquired pneumonia) Admit to observation and placed on Rocephin/azithromycin.  Check sputum cultures.  COVID testing ordered to rule out COVID-pneumonia.  No evidence of hypoxia or concerning findings.  Active Problems:   Possible syncopal event We will hold antihypertensives and gently hydrate.  Will also hold Flexeril, gabapentin and Benadryl.    Abnormal chest x-ray and patient with a history of smoking and active vape use Reviewed the patient's abnormal spiculated nodule with her and recommended she have a repeat CT scan in 6-12 months.    Hypokalemia  Mild.  Added potassium to IV fluids.    Coronary artery disease involving native heart with angina pectoris (HCC) Continue aspirin, Plavix, and Lipitor.  EKG negative for ischemic findings.    TIA (transient ischemic attack) Continue aspirin, Plavix and Lipitor.  CT of the head negative for acute findings.    Headache Neck problem.  Pain  medication (Ultram) ordered as needed.    Essential hypertension Holding antihypertensives given possible syncopal event.    Mixed hyperlipidemia Continue Lipitor.  May benefit from high intensity statin given history of CAD, but will defer to PCP.    Fibromyalgia Continue Zoloft.  Ultram ordered as needed.  Holding gabapentin given syncopal event.    Depression/anxiety Continue Zoloft and Xanax as needed.    Obesity Body mass index is 33.06 kg/m.  Other information:   DVT prophylaxis: Lovenox ordered. Code Status: Full code. Family Communication: Granddaughter updated at the bedside. Disposition Plan: Home within 48 hours if stable. Consults called: None. Admission status: Observation.  The medical decision making on this patient was of high complexity and the patient is at high risk for clinical deterioration, therefore this is a level 3 visit.   Trula Ore Ameliya Nicotra [facility] Pager [phone]   How to contact the [facility] Attending or Consulting provider 7A - 7P or covering provider during after hours 7P -7A, for this patient?   Check the care team in [facility] and look for a) attending/consulting [facility] provider listed and b) the [facility] team listed Log into [email or web address] and use [facility] universal password to access. If you do not have the password, please contact the hospital operator. Locate the [facility] provider you are looking for under [facility] and page to a number that you can be directly reached. If you still have difficulty reaching the provider, please page the [facility] (Director on Call) for the Hospitalists listed on amion for assistance.  12/10/2022, 9:05 PM

## 2022-12-10 NOTE — ED Notes (Signed)
ED PA at bedside

## 2022-12-10 NOTE — ED Notes (Signed)
Multiple opportunities given to patient to urinate. patient unable to provide urine  at this time.

## 2022-12-10 NOTE — ED Provider Notes (Signed)
[place] EMERGENCY DEPARTMENT AT [facility] Provider Note   CSN: [id number] Arrival date & time: 12/10/22  1700     History  Chief Complaint  Patient presents with   Weakness    [name] is a 72 y.o. female with past medical history significant for CAD, fibromyalgia, prediabetes, anxiety who presents concern for "not feeling herself", fall earlier today without head injury, inability to stand, fever of 102 prior to arrival.  She did receive 1000 mg Tylenol and route.  She denies any chest pain, shortness of breath, dysuria, she does report that she had a mild sore throat a few days ago which is since resolved.  She has had a previous history of mini stroke, reports she is taking aspirin, Plavix as directed.   Weakness      Home Medications Prior to Admission medications   Medication Sig Start Date End Date Taking? Authorizing Provider  acetaminophen (TYLENOL) 500 MG tablet Take 1,000 mg by mouth every 6 (six) hours as needed for mild pain or moderate pain.    [provider]  ALPRAZolam Prudy Feeler) 0.5 MG tablet Take 0.5 mg by mouth 3 (three) times daily as needed for anxiety.    [provider]  amLODipine (NORVASC) 10 MG tablet Take 10 mg by mouth daily.    [provider]  aspirin EC 81 MG tablet Take 1 tablet (81 mg total) by mouth daily with breakfast. - take Aspirin 81 mg daily along with Plavix 75 mg daily for 21 days then after that STOP the Aspirin and continue ONLY Plavix 75 mg daily indefinitely--for secondary stroke Prevention 08/22/22 08/22/23  Shon Hale, MD  atorvastatin (LIPITOR) 20 MG tablet Take 1 tablet (20 mg total) by mouth daily. 08/22/22 08/22/23  Shon Hale, MD  cholecalciferol (VITAMIN D) 1000 units tablet Take 1,000 Units by mouth daily.    [provider]  clobetasol (TEMOVATE) 0.05 % external solution Apply 1 application  topically 2 (two) times daily as needed (rash/irritation). scalp    [provider]  clopidogrel (PLAVIX) 75 MG tablet Take 1 tablet (75 mg total) by mouth daily. - take Aspirin 81 mg daily along with Plavix 75 mg daily for 21 days then after that STOP the Aspirin and continue ONLY Plavix 75 mg daily indefinitely--for secondary stroke Prevention 08/22/22 08/22/23  Shon Hale, MD  cyclobenzaprine (FLEXERIL) 10 MG tablet Take 10 mg by mouth 3 (three) times daily as needed for muscle spasms.    [provider]  diphenhydrAMINE (BENADRYL) 25 mg capsule Take 25 mg by mouth every 6 (six) hours as needed for itching or allergies.    [provider]  furosemide (LASIX) 40 MG tablet Take 40 mg by mouth daily.    [provider]  gabapentin (NEURONTIN) 400 MG capsule Take 800 mg by mouth 2 (two) times daily.    [provider]  hydrOXYzine (ATARAX) 25 MG tablet Take 1 tablet (25 mg total) by mouth every 6 (six) hours. 08/26/22   Kommor, Madison, MD  methylPREDNISolone (MEDROL DOSEPAK) 4 MG TBPK tablet Take as prescribed 08/26/22   Kommor, Madison, MD  Multiple Vitamins-Minerals (MULTIVITAMIN WITH MINERALS) tablet Take 1 tablet by mouth daily. Women's One-A-Day    [provider]  nitroGLYCERIN (NITROSTAT) 0.4 MG SL tablet Place 1 tablet (0.4 mg total) under the tongue every 5 (five) minutes x 3 doses as needed for chest pain (if no relief after 3rd dose, proceed to the ED or call  911). 10/22/18   Jonelle Sidle, MD  sertraline (ZOLOFT) 25 MG tablet Take 25 mg by mouth daily. 03/13/22   [provider]  traMADol (ULTRAM) 50 MG tablet One tablet every six hours as needed for pain. Patient taking differently: Take 50 mg by mouth every 6 (six) hours as needed for moderate pain. One tablet every six hours as needed for pain. 10/28/19   Darreld Mclean, MD  triamcinolone (KENALOG) 0.1 % paste Use as directed 1 application in the mouth or throat 2 (two) times daily as needed (mouth ulcers).    [provider]  triamcinolone  cream (KENALOG) 0.1 % Apply 1 Application topically 2 (two) times daily. 08/26/22   Kommor, Madison, MD      Allergies    Iodine, Iohexol, Ambien [zolpidem], Adhesive [tape], Niacin, and Penicillins    Review of Systems   Review of Systems  Neurological:  Positive for weakness.  All other systems reviewed and are negative.   Physical Exam Updated Vital Signs BP (!) 141/59   Pulse (!) 59   Temp 97.9 F (36.6 C) (Oral)   Resp 20   Ht 5\' 2"  (1.575 m)   Wt 82 kg   SpO2 98%   BMI 33.06 kg/m  Physical Exam Vitals and nursing note reviewed.  Constitutional:      General: She is not in acute distress.    Appearance: Normal appearance. She is ill-appearing.  HENT:     Head: Normocephalic and atraumatic.  Eyes:     General:        Right eye: No discharge.        Left eye: No discharge.  Cardiovascular:     Rate and Rhythm: Normal rate and regular rhythm.     Heart sounds: No murmur heard.    No friction rub. No gallop.  Pulmonary:     Effort: Pulmonary effort is normal.     Breath sounds: Normal breath sounds.     Comments: No significant wheezing, rhonchi, stridor, rales, questionable focal consolidation in the left lung base. Abdominal:     General: Bowel sounds are normal.     Palpations: Abdomen is soft.  Skin:    General: Skin is warm and dry.     Capillary Refill: Capillary refill takes less than 2 seconds.  Neurological:     Mental Status: She is alert and oriented to person, place, and time.     Comments: Cranial nerves II through XII grossly intact.  Intact finger-nose, intact heel-to-shin.  Unable to stand without assistance, unable to assess gait.  Alert and oriented x3.  Moves all 4 limbs spontaneously, normal coordination.  No pronator drift.  Intact strength 5 out of 5 bilateral upper and lower extremities.  Patient overall appropriate for me but patient reports that she has been generally confused for the last 2 days    Psychiatric:        Mood and Affect:  Mood normal.        Behavior: Behavior normal.     ED Results / Procedures / Treatments   Labs (all labs ordered are listed, but only abnormal results are displayed) Labs Reviewed  COMPREHENSIVE METABOLIC PANEL - Abnormal; Notable for the following components:      Result Value   Sodium 131 (*)    Potassium 3.2 (*)    Glucose, Bld 153 (*)    Calcium 8.4 (*)    Total Protein 6.4 (*)    Albumin 3.2 (*)  All other components within normal limits  CBC WITH DIFFERENTIAL/PLATELET - Abnormal; Notable for the following components:   WBC 24.1 (*)    RBC 3.83 (*)    Hemoglobin 11.4 (*)    HCT 33.8 (*)    Neutro Abs 19.8 (*)    Monocytes Absolute 2.0 (*)    Abs Immature Granulocytes 0.14 (*)    All other components within normal limits  PROTIME-INR - Abnormal; Notable for the following components:   Prothrombin Time 15.4 (*)    All other components within normal limits  CULTURE, BLOOD (ROUTINE X 2)  CULTURE, BLOOD (ROUTINE X 2)  GROUP A STREP BY PCR  SARS CORONAVIRUS 2 BY RT PCR  LACTIC ACID, PLASMA  LACTIC ACID, PLASMA  APTT  URINALYSIS, W/ REFLEX TO CULTURE (INFECTION SUSPECTED)    EKG EKG Interpretation  Date/Time:  Sunday Dec 10 2022 19:46:49 EDT Ventricular Rate:  60 PR Interval:  208 QRS Duration: 102 QT Interval:  432 QTC Calculation: 432 R Axis:   66 Text Interpretation: Sinus rhythm Atrial premature complex Baseline wander in lead(s) V1 Confirmed by Eber Hong ([id number]) on 12/10/2022 8:14:23 PM  Radiology CT Chest Wo Contrast  Result Date: 12/10/2022 CLINICAL DATA:  Abnormal xray - lung nodule, >= 1 cm, fever EXAM: CT CHEST WITHOUT CONTRAST TECHNIQUE: Multidetector CT imaging of the chest was performed following the standard protocol without IV contrast. RADIATION DOSE REDUCTION: This exam was performed according to the departmental dose-optimization program which includes automated exposure control, adjustment of the mA and/or kV according to patient size  and/or use of iterative reconstruction technique. COMPARISON:  None Available. FINDINGS: Cardiovascular: Moderate multi-vessel coronary artery calcification with prior left anterior descending coronary artery stenting. Global cardiac size within normal limits. No pericardial effusion. Central pulmonary arteries are of normal caliber. Moderate atherosclerotic calcification within the thoracic aorta. No aortic aneurysm. Particularly prominent atherosclerotic calcifications seen at the origin of the left subclavian artery, however, the degree of stenosis is not well assessed on this noncontrast examination. Mediastinum/Nodes: Visualized thyroid is unremarkable. Shotty right paratracheal and prevascular lymphadenopathy is present, nonspecific, possibly reactive in nature. The esophagus is unremarkable. Lungs/Pleura: There is masslike consolidation within the superior segment of the left lower lobe which neither obliterates nor demonstrates significant mass effect upon the traversing bronchi most in keeping with an inflammatory process such as round pneumonia. Mean 8 mm slightly spiculated pulmonary nodule noted within the left lower lobe, axial image # 86/4, indeterminate. No pneumothorax or pleural effusion. No central obstructing lesion. Upper Abdomen: No acute abnormality.  Status post cholecystectomy Musculoskeletal: Osseous structures are age-appropriate. No acute bone abnormality. No lytic or blastic bone lesion IMPRESSION: 1. Masslike consolidation within the superior segment of the left lower lobe most in keeping with an inflammatory process such as round pneumonia in the acute setting. Follow-up chest radiograph is recommended, however, in 3-4 weeks to document complete resolution. 2. Mean 8 mm slightly spiculated pulmonary nodule within the left lower lobe, indeterminate. Non-contrast chest CT at 6-12 months is recommended. If the nodule is stable at time of repeat CT, then future CT at 18-24 months (from  today's scan) is considered optional for low-risk patients, but is recommended for high-risk patients. This recommendation follows the consensus statement: Guidelines for Management of Incidental Pulmonary Nodules Detected on CT Images: From the Fleischner Society 2017; Radiology 2017; 284:228-243. 3. Moderate multi-vessel coronary artery calcification with prior left anterior descending coronary artery stenting. 4. Moderate atherosclerotic calcification within the thoracic aorta. Particularly prominent  atherosclerotic calcifications seen at the origin of the left subclavian artery, however, the degree of stenosis is not well assessed on this noncontrast examination. If there is clinical evidence of subclavian steal, this could be better assessed with CT arteriography. Aortic Atherosclerosis (ICD10-I70.0). Electronically Signed   By: Helyn Numbers M.D.   On: 12/10/2022 19:46   DG Chest Port 1 View  Result Date: 12/10/2022 CLINICAL DATA:  Fever, questionable sepsis EXAM: PORTABLE CHEST 1 VIEW COMPARISON:  12/26/2010, 08/22/2022 FINDINGS: Single frontal view of the chest demonstrates a unremarkable cardiac silhouette. 6.1 cm rounded masslike area of consolidation projects in the left perihilar region. There is no abnormality in this region on the prior CTA neck performed 08/22/2022. The remainder of the lungs are clear. No effusion or pneumothorax. No acute bony abnormality. Stable postsurgical changes right shoulder. IMPRESSION: 1. 6.1 cm rounded masslike consolidation within the left hilar region. Differential diagnosis includes rounded pneumonia versus neoplasm. CT chest with IV contrast may be useful for further evaluation. Electronically Signed   By: Sharlet Salina M.D.   On: 12/10/2022 18:21   CT Head Wo Contrast  Result Date: 12/10/2022 CLINICAL DATA:  Severe headache.  Fever. EXAM: CT HEAD WITHOUT CONTRAST TECHNIQUE: Contiguous axial images were obtained from the base of the skull through the vertex  without intravenous contrast. RADIATION DOSE REDUCTION: This exam was performed according to the departmental dose-optimization program which includes automated exposure control, adjustment of the mA and/or kV according to patient size and/or use of iterative reconstruction technique. COMPARISON:  08/22/2022 FINDINGS: Brain: No evidence of intracranial hemorrhage, acute infarction, hydrocephalus, extra-axial collection, or mass lesion/mass effect. Mild diffuse cerebral and cerebellar atrophy shows no significant change. Vascular:  No hyperdense vessel or other acute findings. Skull: No evidence of fracture or other significant bone abnormality. Sinuses/Orbits:  No acute findings. Other: None. IMPRESSION: No acute intracranial abnormality. Stable mild diffuse cerebral and cerebellar atrophy. Electronically Signed   By: Danae Orleans M.D.   On: 12/10/2022 18:18    Procedures .Critical Care  Performed by: Olene Floss, PA-C Authorized by: Olene Floss, PA-C   Critical care provider statement:    Critical care time (minutes):  30   Critical care was necessary to treat or prevent imminent or life-threatening deterioration of the following conditions:  Sepsis   Critical care was time spent personally by me on the following activities:  Development of treatment plan with patient or surrogate, discussions with consultants, evaluation of patient's response to treatment, examination of patient, ordering and review of laboratory studies, ordering and review of radiographic studies, ordering and performing treatments and interventions, pulse oximetry, re-evaluation of patient's condition and review of old charts     Medications Ordered in ED Medications  cefTRIAXone (ROCEPHIN) 1 g in sodium chloride 0.9 % 100 mL IVPB (1 g Intravenous New Bag/Given 12/10/22 2035)  azithromycin (ZITHROMAX) 500 mg in sodium chloride 0.9 % 250 mL IVPB (500 mg Intravenous New Bag/Given 12/10/22 2035)  lactated  ringers bolus 1,000 mL (0 mLs Intravenous Stopped 12/10/22 2040)    ED Course/ Medical Decision Making/ A&P                             Medical Decision Making Amount and/or Complexity of Data Reviewed Labs: ordered. Radiology: ordered. ECG/medicine tests: ordered.   This patient is a 72 y.o. female who presents to the ED for concern of undifferentiated fever, weakness, this involves an extensive number  of treatment options, and is a complaint that carries with it a high risk of complications and morbidity. The emergent differential diagnosis prior to evaluation includes, but is not limited to,  CVA, spinal cord injury, ACS, arrhythmia, syncope, orthostatic hypotension, sepsis, hypoglycemia, hypoxia, electrolyte disturbance, endocrine disorder, anemia, environmental exposure, polypharmacy, most suspicious of an infectious etiology versus posterior circulation stroke, she did have some upper respiratory infectious symptoms, may have developed a pneumonia. This is not an exhaustive differential.   Past Medical History / Co-morbidities / Social History: Coronary artery disease, prediabetes, fibromyalgia, hypertension, depression  Additional history: Chart reviewed. Pertinent results include: Reviewed outpatient echo from January of this year, lab work, imaging from previous emergency department evaluations.  Physical Exam: Physical exam performed. The pertinent findings include: Patient febrile on arrival, she is not in acute respiratory distress, she does have some questionable focal consolidation on the left on my exam.  No significant wheezing, rhonchi, stridor.  She has no focal neurologic deficits, but she cannot stand without assistance.,  However with no ataxia to finger-nose-finger, heel shin.  Lab Tests: I ordered, and personally interpreted labs.  The pertinent results include: Patient with significant leukocytosis, white blood cells 24.1.  She has mild anemia, hemoglobin 11.1.  CMP  notable for mild hyponatremia, sodium 131, hypokalemia, testing 2.2.  Her initial lactic acid is normal, strep is undetectable.  Blood cultures pending at this time.   Imaging Studies: I ordered imaging studies including symptom chest x-ray, CT head without contrast, CT chest without contrast. I independently visualized and interpreted imaging which showed large 6 centimeter circular mass in the left lung, after CT elucidated as a pneumonia, she additionally has an 8 mm spiculated lesion that they recommend further evaluation with repeat CT in the future. I agree with the radiologist interpretation.   Cardiac Monitoring:  The patient was maintained on a cardiac monitor.  My attending physician Dr. Hyacinth Meeker viewed and interpreted the cardiac monitored which showed an underlying rhythm of: NSR, baseline wander. I agree with this interpretation.   Medications: I ordered medication including LR bolus, Rocephin, azithromycin for presumed community-acquired pneumonia, undifferentiated septic presentation on arrival  Consultations Obtained: I requested consultation with the hospitalist, spoke with Dr. Darnelle Catalan,  and discussed lab and imaging findings as well as pertinent plan - they recommend: Admission for pneumonia, significant leukocytosis   Disposition: After consideration of the diagnostic results and the patients response to treatment, I feel that patient would benefit from admission as discussed above.   I discussed this case with my attending physician Dr. Hyacinth Meeker who cosigned this note including patient's presenting symptoms, physical exam, and planned diagnostics and interventions. Attending physician stated agreement with plan or made changes to plan which were implemented.    Final Clinical Impression(s) / ED Diagnoses Final diagnoses:  None    Rx / DC Orders ED Discharge Orders     None         [place] 12/10/22 2101    Eber Hong, MD 12/10/22 2324

## 2022-12-10 NOTE — ED Triage Notes (Signed)
Pt just not "feeling herself" fever 102.0 in ED. EMS gave pt 1000mg  tylenol in route.

## 2022-12-11 ENCOUNTER — Observation Stay (HOSPITAL_COMMUNITY): Payer: Medicare HMO

## 2022-12-11 DIAGNOSIS — E876 Hypokalemia: Secondary | ICD-10-CM | POA: Diagnosis present

## 2022-12-11 DIAGNOSIS — A419 Sepsis, unspecified organism: Secondary | ICD-10-CM | POA: Diagnosis present

## 2022-12-11 DIAGNOSIS — Y92003 Bedroom of unspecified non-institutional (private) residence as the place of occurrence of the external cause: Secondary | ICD-10-CM | POA: Diagnosis not present

## 2022-12-11 DIAGNOSIS — R0603 Acute respiratory distress: Secondary | ICD-10-CM | POA: Diagnosis not present

## 2022-12-11 DIAGNOSIS — F17290 Nicotine dependence, other tobacco product, uncomplicated: Secondary | ICD-10-CM | POA: Diagnosis present

## 2022-12-11 DIAGNOSIS — G9341 Metabolic encephalopathy: Secondary | ICD-10-CM | POA: Diagnosis present

## 2022-12-11 DIAGNOSIS — J441 Chronic obstructive pulmonary disease with (acute) exacerbation: Secondary | ICD-10-CM | POA: Diagnosis present

## 2022-12-11 DIAGNOSIS — E782 Mixed hyperlipidemia: Secondary | ICD-10-CM | POA: Diagnosis present

## 2022-12-11 DIAGNOSIS — I10 Essential (primary) hypertension: Secondary | ICD-10-CM

## 2022-12-11 DIAGNOSIS — Z8673 Personal history of transient ischemic attack (TIA), and cerebral infarction without residual deficits: Secondary | ICD-10-CM | POA: Diagnosis not present

## 2022-12-11 DIAGNOSIS — Z6834 Body mass index (BMI) 34.0-34.9, adult: Secondary | ICD-10-CM | POA: Diagnosis not present

## 2022-12-11 DIAGNOSIS — J440 Chronic obstructive pulmonary disease with acute lower respiratory infection: Secondary | ICD-10-CM | POA: Diagnosis present

## 2022-12-11 DIAGNOSIS — R55 Syncope and collapse: Secondary | ICD-10-CM

## 2022-12-11 DIAGNOSIS — E860 Dehydration: Secondary | ICD-10-CM | POA: Diagnosis present

## 2022-12-11 DIAGNOSIS — J189 Pneumonia, unspecified organism: Secondary | ICD-10-CM | POA: Diagnosis present

## 2022-12-11 DIAGNOSIS — E669 Obesity, unspecified: Secondary | ICD-10-CM | POA: Diagnosis present

## 2022-12-11 DIAGNOSIS — R569 Unspecified convulsions: Secondary | ICD-10-CM | POA: Diagnosis not present

## 2022-12-11 DIAGNOSIS — J9601 Acute respiratory failure with hypoxia: Secondary | ICD-10-CM | POA: Diagnosis not present

## 2022-12-11 DIAGNOSIS — E871 Hypo-osmolality and hyponatremia: Secondary | ICD-10-CM | POA: Diagnosis present

## 2022-12-11 DIAGNOSIS — F32A Depression, unspecified: Secondary | ICD-10-CM | POA: Diagnosis present

## 2022-12-11 DIAGNOSIS — Z96651 Presence of right artificial knee joint: Secondary | ICD-10-CM | POA: Diagnosis present

## 2022-12-11 DIAGNOSIS — Z1152 Encounter for screening for COVID-19: Secondary | ICD-10-CM | POA: Diagnosis not present

## 2022-12-11 DIAGNOSIS — Z8249 Family history of ischemic heart disease and other diseases of the circulatory system: Secondary | ICD-10-CM | POA: Diagnosis not present

## 2022-12-11 DIAGNOSIS — R6520 Severe sepsis without septic shock: Secondary | ICD-10-CM | POA: Diagnosis present

## 2022-12-11 DIAGNOSIS — Z634 Disappearance and death of family member: Secondary | ICD-10-CM | POA: Diagnosis not present

## 2022-12-11 DIAGNOSIS — Z955 Presence of coronary angioplasty implant and graft: Secondary | ICD-10-CM | POA: Diagnosis not present

## 2022-12-11 DIAGNOSIS — J181 Lobar pneumonia, unspecified organism: Secondary | ICD-10-CM | POA: Diagnosis present

## 2022-12-11 DIAGNOSIS — M797 Fibromyalgia: Secondary | ICD-10-CM | POA: Diagnosis present

## 2022-12-11 DIAGNOSIS — F419 Anxiety disorder, unspecified: Secondary | ICD-10-CM | POA: Diagnosis present

## 2022-12-11 DIAGNOSIS — W19XXXA Unspecified fall, initial encounter: Secondary | ICD-10-CM | POA: Diagnosis present

## 2022-12-11 LAB — MAGNESIUM: Magnesium: 1.8 mg/dL (ref 1.7–2.4)

## 2022-12-11 LAB — CBC
HCT: 32.1 % — ABNORMAL LOW (ref 36.0–46.0)
Hemoglobin: 10.4 g/dL — ABNORMAL LOW (ref 12.0–15.0)
MCH: 29.7 pg (ref 26.0–34.0)
MCHC: 32.4 g/dL (ref 30.0–36.0)
MCV: 91.7 fL (ref 80.0–100.0)
Platelets: 258 10*3/uL (ref 150–400)
RBC: 3.5 MIL/uL — ABNORMAL LOW (ref 3.87–5.11)
RDW: 12.4 % (ref 11.5–15.5)
WBC: 19.3 10*3/uL — ABNORMAL HIGH (ref 4.0–10.5)
nRBC: 0 % (ref 0.0–0.2)

## 2022-12-11 LAB — ECHOCARDIOGRAM COMPLETE
AV Mean grad: 6.2 mmHg
AV Peak grad: 11.6 mmHg
Ao pk vel: 1.7 m/s
Area-P 1/2: 4.96 cm2
Height: 62 in
MV M vel: 1.11 m/s
MV Peak grad: 4.9 mmHg
S' Lateral: 2.3 cm
Weight: 2984.15 oz

## 2022-12-11 LAB — BASIC METABOLIC PANEL
Anion gap: 8 (ref 5–15)
BUN: 9 mg/dL (ref 8–23)
CO2: 23 mmol/L (ref 22–32)
Calcium: 8.1 mg/dL — ABNORMAL LOW (ref 8.9–10.3)
Chloride: 104 mmol/L (ref 98–111)
Creatinine, Ser: 0.68 mg/dL (ref 0.44–1.00)
GFR, Estimated: >60 mL/min (ref >60)
Glucose, Bld: 116 mg/dL — ABNORMAL HIGH (ref 70–99)
Potassium: 3.6 mmol/L (ref 3.5–5.1)
Sodium: 135 mmol/L (ref 135–145)

## 2022-12-11 LAB — HEMOGLOBIN A1C
Hgb A1c MFr Bld: 5.2 % (ref 4.8–5.6)
Mean Plasma Glucose: 102.54 mg/dL

## 2022-12-11 LAB — VITAMIN B12: Vitamin B-12: 327 pg/mL (ref 180–914)

## 2022-12-11 LAB — T4, FREE: Free T4: 0.75 ng/dL (ref 0.61–1.12)

## 2022-12-11 LAB — TSH: TSH: 0.875 u[IU]/mL (ref 0.350–4.500)

## 2022-12-11 LAB — PROCALCITONIN: Procalcitonin: 0.38 ng/mL

## 2022-12-11 LAB — FOLATE: Folate: 30.3 ng/mL (ref >5.9)

## 2022-12-11 MED ORDER — GUAIFENESIN-DM 100-10 MG/5ML PO SYRP: 5.0000 mL | ORAL_SOLUTION | ORAL | Status: DC | PRN

## 2022-12-11 MED ORDER — MUSCLE RUB 10-15 % EX CREA
TOPICAL_CREAM | CUTANEOUS | Status: DC | PRN
  Filled 2022-12-11: qty 85

## 2022-12-11 MED ORDER — GUAIFENESIN 100 MG/5ML PO LIQD: 5.0000 mL | ORAL | Status: DC | PRN

## 2022-12-11 MED ORDER — GUAIFENESIN-DM 100-10 MG/5ML PO SYRP
5.0000 mL | ORAL_SOLUTION | ORAL | Status: DC | PRN
  Administered 2022-12-11 – 2022-12-12 (×3): 5 mL via ORAL
  Filled 2022-12-11 (×3): qty 5

## 2022-12-11 MED ORDER — POTASSIUM CHLORIDE CRYS ER 20 MEQ PO TBCR
20.0000 meq | EXTENDED_RELEASE_TABLET | Freq: Once | ORAL | Status: AC
  Administered 2022-12-11: 20 meq via ORAL
  Filled 2022-12-11: qty 1

## 2022-12-11 NOTE — Progress Notes (Signed)
  Transition of Care [facility]) Screening Note   Patient Details  Name: [name] Date of Birth: 1951/01/25   Transition of Care [facility] CM/SW Contact:    Annice Needy, LCSW Phone Number: 12/11/2022, 10:54 AM    Transition of Care Department [facility]) has reviewed patient and no TOC needs have been identified at this time. We will continue to monitor patient advancement through interdisciplinary progression rounds. If new patient transition needs arise, please place a TOC consult.

## 2022-12-11 NOTE — Progress Notes (Signed)
Mobility Specialist Progress Note:   12/11/22 1127  Mobility  Activity Ambulated with assistance in room  Level of Assistance Modified independent, requires aide device or extra time  Assistive Device Other (Comment) (IV pole)  Distance Ambulated (ft) 24 ft  Range of Motion/Exercises Active;All extremities  Activity Response Tolerated well  Mobility Referral Yes  $Mobility charge 1 Mobility  Mobility Specialist Start Time (ACUTE ONLY) 1127  Mobility Specialist Stop Time (ACUTE ONLY) 1147  Mobility Specialist Time Calculation (min) (ACUTE ONLY) 20 min   Pt agreeable to mobility session, received in bed. Tolerated session well, unstable at times d/t leg soreness, CGA required for safety. Ambulated in room with IV pole, no c/o dizziness. See vitals below. Returned pt to bed, alarm on, all needs met, call bell in reach.   SpO2 99% on RA BP: 169/56 (88) HR: 69 bpm  [name] Mobility Specialist Please contact via Special educational needs teacher or  Rehab office at [phone]

## 2022-12-11 NOTE — Progress Notes (Signed)
  Echocardiogram 2D Echocardiogram has been performed.  [name] 12/11/2022, 2:00 PM

## 2022-12-11 NOTE — Progress Notes (Signed)
PROGRESS NOTE  [name] ZOX:[id number] DOB: 1950-12-10 DOA: 12/10/2022 PCP: Estanislado Pandy, MD  Brief History:  72 year old female with a history of coronary disease with stenting, hypertension, hyperlipidemia, impaired glucose tolerance, fibromyalgia presenting with at least 1 week history of fever and generalized weakness.  The patient is a difficult historian.  History is supplemented by review of medical record and speaking with the patient's granddaughter.  Patient granddaughter noted that the patient was a little confused on 12/09/2022.  On 12/10/2022, the patient was found on the floor of her bedroom screaming for help.  The patient's 2 year old mother subsequently contacted the patient's son who helped the patient back into bed.  Apparently the patient was confused at that time.  EMS was activated.  The patient has had intermittent coughing, but denies any chest pain, shortness breath, hemoptysis, nausea, vomiting,  abdominal pain.  The patient has been having loose stools although unable to quantify how frequent over the duration.  There is no been any new medications. One of the patient's son recently died about 2 weeks prior to this admission.  Since then, the patient has had decreased oral intake.  The patient has at least a 50-pack-year history of tobacco, but has been vaping for the last 5 to 6 years. In the ED, the patient was febrile up to 102.0 F.  She was hemodynamically stable with oxygen saturation 98% room air.  WBC 24.1, hemoglobin 9.4, platelets 269,000.  Sodium 131, potassium 2.2, bicarb 23, serum creatinine 0.1.  LFTs were unremarkable.  COVID-19 PCR was negative.  Lactic acid peaked 1.5.  UA was negative for pyuria.  Chest x-ray showed nodular consolidation in the left perihilar region.  CT chest showed masslike consolidation in the superior segment of LLL.  There is also an 8 mm spiculated nodule in the LLL.  The patient was started on ceftriaxone, azithromycin, and IV  fluids.   Assessment/Plan: Sepsis -Present on admission -Presented with fever and leukocytosis -Secondary to pneumonia -Lactic acid peaked 1.5 -Check procalcitonin -Follow blood cultures -Continue IV fluids  Lobar pneumonia -12/10/2022 CT chest as discussed above -Patient will need repeat CT chest in about 4 weeks to ensure resolution and rule out underlying mass/neoplasm -Continue ceftriaxone and azithromycin -Check PCT  Acute metabolic encephalopathy -Secondary to infectious process -Further workup if does not show continued improvement -UA negative for pyuria -Holding Benadryl, Flexeril, Atarax, gabapentin  Generalized weakness -Multifactorial including infectious process, deconditioning, and electrolyte abnormalities -B12 -TSH -Folic acid -PT evaluation  Syncope -Patient was found on the floor unable to remember what happened -Echocardiogram -EEG -Orthostatics  Diarrhea -C. Difficile -Stool pathogen panel  History of TIA -Continue Plavix  Coronary disease -No chest pain presently -Continue Plavix and statin  Essential hypertension -Restart amlodipine  Depression/anxiety -Continue sertraline and as needed alprazolam -PDMP reviewed -Patient receives alprazolam, #90, last refill 11/22/2022  Obesity -BMI 34.11 -Lifestyle modification        Family Communication:  grand daughter updated 5/20  Consultants:  none  Code Status:  FULL   DVT Prophylaxis:   [place] Lovenox   Procedures: As Listed in Progress Note Above  Antibiotics: Ceftriaxone 5/19>> Azithro 5/19>>      Subjective: Patient continues to feel weak all over.  She denies any fevers, chills, chest pain, shortness breath patient has dry cough.  There is no nausea, vomit, diarrhea, abdominal pain.  Objective: Vitals:   12/10/22 2209 12/10/22 2242 12/11/22 8119 12/11/22 1478  BP:  125/86 (!) 148/52 (!) 136/58  Pulse: 62 68 77 78  Resp: 18 18 18 18   Temp:  98.2 F (36.8 C) 98.4  F (36.9 C) 98.6 F (37 C)  TempSrc:  Oral Oral Oral  SpO2: 100% 100% 95% 98%  Weight:  84.6 kg    Height:  5\' 2"  (1.575 m)      Intake/Output Summary (Last 24 hours) at 12/11/2022 0830 Last data filed at 12/11/2022 0600 Gross per 24 hour  Intake 1925.46 ml  Output --  Net 1925.46 ml   Weight change:  Exam:  General:  Pt is alert, follows commands appropriately, not in acute distress HEENT: No icterus, No thrush, No neck mass, [place]/AT Cardiovascular: RRR, S1/S2, no rubs, no gallops Respiratory: Bibasal rales.  Wheezing.  Abdomen Abdomen: Soft/+BS, non tender, non distended, no guarding Extremities: No edema, No lymphangitis, No petechiae, No rashes, no synovitis   Data Reviewed: I have personally reviewed following labs and imaging studies Basic Metabolic Panel: Recent Labs  Lab 12/10/22 1800  NA 131*  K 3.2*  CL 98  CO2 23  GLUCOSE 153*  BUN 13  CREATININE 0.81  CALCIUM 8.4*   Liver Function Tests: Recent Labs  Lab 12/10/22 1800  AST 28  ALT 19  ALKPHOS 52  BILITOT 1.1  PROT 6.4*  ALBUMIN 3.2*   No results for input(s): "LIPASE", "AMYLASE" in the last 168 hours. No results for input(s): "AMMONIA" in the last 168 hours. Coagulation Profile: Recent Labs  Lab 12/10/22 1800  INR 1.2   CBC: Recent Labs  Lab 12/10/22 1800  WBC 24.1*  NEUTROABS 19.8*  HGB 11.4*  HCT 33.8*  MCV 88.3  PLT 369   Cardiac Enzymes: No results for input(s): "CKTOTAL", "CKMB", "CKMBINDEX", "TROPONINI" in the last 168 hours. BNP: Invalid input(s): "POCBNP" CBG: No results for input(s): "GLUCAP" in the last 168 hours. HbA1C: No results for input(s): "HGBA1C" in the last 72 hours. Urine analysis:    Component Value Date/Time   COLORURINE YELLOW 12/10/2022 2115   APPEARANCEUR CLEAR 12/10/2022 2115   LABSPEC 1.010 12/10/2022 2115   PHURINE 6.5 12/10/2022 2115   GLUCOSEU NEGATIVE 12/10/2022 2115   HGBUR NEGATIVE 12/10/2022 2115   BILIRUBINUR NEGATIVE 12/10/2022 2115    KETONESUR NEGATIVE 12/10/2022 2115   PROTEINUR 100 (A) 12/10/2022 2115   UROBILINOGEN 0.2 12/26/2010 1545   NITRITE NEGATIVE 12/10/2022 2115   LEUKOCYTESUR TRACE (A) 12/10/2022 2115   Sepsis Labs: @LABRCNTIP (procalcitonin:4,lacticidven:4) ) Recent Results (from the past 240 hour(s))  Blood Culture (routine x 2)     Status: None (Preliminary result)   Collection Time: 12/10/22  6:00 PM   Specimen: Blood  Result Value Ref Range Status   Specimen Description BLOOD RIGHT ARM  Final   Special Requests   Final    BOTTLES DRAWN AEROBIC AND ANAEROBIC Blood Culture adequate volume Performed at [facility], [address] [place], [place] [address]    Culture PENDING  Incomplete   Report Status PENDING  Incomplete  Blood Culture (routine x 2)     Status: None (Preliminary result)   Collection Time: 12/10/22  6:11 PM   Specimen: Blood  Result Value Ref Range Status   Specimen Description BLOOD RIGHT ARM  Final   Special Requests   Final    BOTTLES DRAWN AEROBIC AND ANAEROBIC Blood Culture results may not be optimal due to an inadequate volume of blood received in culture bottles Performed at [facility], [address], [place], [place]  [address]    Culture PENDING  Incomplete   Report Status PENDING  Incomplete  Group A Strep by PCR     Status: None   Collection Time: 12/10/22  7:20 PM   Specimen: Throat; Sterile Swab  Result Value Ref Range Status   Group A Strep by PCR NOT DETECTED NOT DETECTED Final    Comment: Performed at [facility], [address], [place], [place] [address]  SARS Coronavirus 2 by RT PCR (hospital order, performed in [facility] hospital lab) *cepheid single result test* Anterior Nasal Swab     Status: None   Collection Time: 12/10/22  8:30 PM   Specimen: Anterior Nasal Swab  Result Value Ref Range Status   SARS Coronavirus 2 by RT PCR NEGATIVE NEGATIVE Final    Comment: (NOTE) SARS-CoV-2 target nucleic acids are NOT DETECTED.  The SARS-CoV-2 RNA is  generally detectable in upper and lower respiratory specimens during the acute phase of infection. The lowest concentration of SARS-CoV-2 viral copies this assay can detect is 250 copies / mL. A negative result does not preclude SARS-CoV-2 infection and should not be used as the sole basis for treatment or other patient management decisions.  A negative result may occur with improper specimen collection / handling, submission of specimen other than nasopharyngeal swab, presence of viral mutation(s) within the areas targeted by this assay, and inadequate number of viral copies (<250 copies / mL). A negative result must be combined with clinical observations, patient history, and epidemiological information.  Fact Sheet for Patients:   RoadLapTop.co.za  Fact Sheet for Healthcare Providers: http://kim-miller.com/  This test is not yet approved or  cleared by the [place] FDA and has been authorized for detection and/or diagnosis of SARS-CoV-2 by FDA under an Emergency Use Authorization (EUA).  This EUA will remain in effect (meaning this test can be used) for the duration of the COVID-19 declaration under Section 564(b)(1) of the Act, 21 U.S.C. section 360bbb-3(b)(1), unless the authorization is terminated or revoked sooner.  Performed at [facility], [address] [place], [place] [address]      Scheduled Meds:  atorvastatin  20 mg Oral Daily   clopidogrel  75 mg Oral Daily   enoxaparin (LOVENOX) injection  40 mg Subcutaneous Q24H   nystatin  5 mL Oral QID   potassium chloride  20 mEq Oral Once   sertraline  25 mg Oral Daily   Continuous Infusions:  0.9 % NaCl with KCl 20 mEq / L 75 mL/hr at 12/10/22 2205   azithromycin Stopped (12/10/22 2137)   cefTRIAXone (ROCEPHIN)  IV      Procedures/Studies: CT Chest Wo Contrast  Result Date: 12/10/2022 CLINICAL DATA:  Abnormal xray - lung nodule, >= 1 cm, fever EXAM: CT CHEST  WITHOUT CONTRAST TECHNIQUE: Multidetector CT imaging of the chest was performed following the standard protocol without IV contrast. RADIATION DOSE REDUCTION: This exam was performed according to the departmental dose-optimization program which includes automated exposure control, adjustment of the mA and/or kV according to patient size and/or use of iterative reconstruction technique. COMPARISON:  None Available. FINDINGS: Cardiovascular: Moderate multi-vessel coronary artery calcification with prior left anterior descending coronary artery stenting. Global cardiac size within normal limits. No pericardial effusion. Central pulmonary arteries are of normal caliber. Moderate atherosclerotic calcification within the thoracic aorta. No aortic aneurysm. Particularly prominent atherosclerotic calcifications seen at the origin of the left subclavian artery, however, the degree of stenosis is not well assessed on this noncontrast examination. Mediastinum/Nodes: Visualized  thyroid is unremarkable. [place] right paratracheal and prevascular lymphadenopathy is present, nonspecific, possibly reactive in nature. The esophagus is unremarkable. Lungs/Pleura: There is masslike consolidation within the superior segment of the left lower lobe which neither obliterates nor demonstrates significant mass effect upon the traversing bronchi most in keeping with an inflammatory process such as round pneumonia. Mean 8 mm slightly spiculated pulmonary nodule noted within the left lower lobe, axial image # 86/4, indeterminate. No pneumothorax or pleural effusion. No central obstructing lesion. Upper Abdomen: No acute abnormality.  Status post cholecystectomy Musculoskeletal: Osseous structures are age-appropriate. No acute bone abnormality. No lytic or blastic bone lesion IMPRESSION: 1. Masslike consolidation within the superior segment of the left lower lobe most in keeping with an inflammatory process such as round pneumonia in the acute  setting. Follow-up chest radiograph is recommended, however, in 3-4 weeks to document complete resolution. 2. Mean 8 mm slightly spiculated pulmonary nodule within the left lower lobe, indeterminate. Non-contrast chest CT at 6-12 months is recommended. If the nodule is stable at time of repeat CT, then future CT at 18-24 months (from today's scan) is considered optional for low-risk patients, but is recommended for high-risk patients. This recommendation follows the consensus statement: Guidelines for Management of Incidental Pulmonary Nodules Detected on CT Images: From the Fleischner Society 2017; Radiology 2017; 284:228-243. 3. Moderate multi-vessel coronary artery calcification with prior left anterior descending coronary artery stenting. 4. Moderate atherosclerotic calcification within the thoracic aorta. Particularly prominent atherosclerotic calcifications seen at the origin of the left subclavian artery, however, the degree of stenosis is not well assessed on this noncontrast examination. If there is clinical evidence of subclavian steal, this could be better assessed with CT arteriography. Aortic Atherosclerosis (ICD10-I70.0). Electronically Signed   By: Helyn Numbers M.D.   On: 12/10/2022 19:46   DG Chest Port 1 View  Result Date: 12/10/2022 CLINICAL DATA:  Fever, questionable sepsis EXAM: PORTABLE CHEST 1 VIEW COMPARISON:  12/26/2010, 08/22/2022 FINDINGS: Single frontal view of the chest demonstrates a unremarkable cardiac silhouette. 6.1 cm rounded masslike area of consolidation projects in the left perihilar region. There is no abnormality in this region on the prior CTA neck performed 08/22/2022. The remainder of the lungs are clear. No effusion or pneumothorax. No acute bony abnormality. Stable postsurgical changes right shoulder. IMPRESSION: 1. 6.1 cm rounded masslike consolidation within the left hilar region. Differential diagnosis includes rounded pneumonia versus neoplasm. CT chest with IV  contrast may be useful for further evaluation. Electronically Signed   By: Sharlet Salina M.D.   On: 12/10/2022 18:21   CT Head Wo Contrast  Result Date: 12/10/2022 CLINICAL DATA:  Severe headache.  Fever. EXAM: CT HEAD WITHOUT CONTRAST TECHNIQUE: Contiguous axial images were obtained from the base of the skull through the vertex without intravenous contrast. RADIATION DOSE REDUCTION: This exam was performed according to the departmental dose-optimization program which includes automated exposure control, adjustment of the mA and/or kV according to patient size and/or use of iterative reconstruction technique. COMPARISON:  08/22/2022 FINDINGS: Brain: No evidence of intracranial hemorrhage, acute infarction, hydrocephalus, extra-axial collection, or mass lesion/mass effect. Mild diffuse cerebral and cerebellar atrophy shows no significant change. Vascular:  No hyperdense vessel or other acute findings. Skull: No evidence of fracture or other significant bone abnormality. Sinuses/Orbits:  No acute findings. Other: None. IMPRESSION: No acute intracranial abnormality. Stable mild diffuse cerebral and cerebellar atrophy. Electronically Signed   By: Danae Orleans M.D.   On: 12/10/2022 18:18    Catarina Hartshorn, DO  [facility]  If 7PM-7AM, please contact night-coverage [email or web address] Password [email or web address] 12/11/2022, 8:30 AM   LOS: 0 days

## 2022-12-11 NOTE — Hospital Course (Addendum)
72 year old female with a history of coronary disease with stenting, hypertension, hyperlipidemia, impaired glucose tolerance, fibromyalgia presenting with at least 1 week history of fever and generalized weakness.  The patient is a difficult historian.  History is supplemented by review of medical record and speaking with the patient's granddaughter.  Patient granddaughter noted that the patient was a little confused on 12/09/2022.  On 12/10/2022, the patient was found on the floor of her bedroom screaming for help.  The patient's 53 year old mother subsequently contacted the patient's son who helped the patient back into bed.  Apparently the patient was confused at that time.  EMS was activated.  The patient has had intermittent coughing, but denies any chest pain, shortness breath, hemoptysis, nausea, vomiting,  abdominal pain.  The patient has been having loose stools although unable to quantify how frequent over the duration.  There is no been any new medications. One of the patient's son recently died about 2 weeks prior to this admission.  Since then, the patient has had decreased oral intake.  The patient has at least a 50-pack-year history of tobacco, but has been vaping for the last 5 to 6 years. In the ED, the patient was febrile up to 102.0 F.  She was hemodynamically stable with oxygen saturation 98% room air.  WBC 24.1, hemoglobin 9.4, platelets 269,000.  Sodium 131, potassium 2.2, bicarb 23, serum creatinine 0.1.  LFTs were unremarkable.  COVID-19 PCR was negative.  Lactic acid peaked 1.5.  UA was negative for pyuria.  Chest x-ray showed nodular consolidation in the left perihilar region.  CT chest showed masslike consolidation in the superior segment of LLL.  There is also an 8 mm spiculated nodule in the LLL.  The patient was started on ceftriaxone, azithromycin, and IV fluids.

## 2022-12-12 ENCOUNTER — Inpatient Hospital Stay (HOSPITAL_COMMUNITY)
Admit: 2022-12-12 | Discharge: 2022-12-12 | Disposition: A | Discharge status: Home / Self Care | Payer: Medicare HMO | Attending: Internal Medicine | Admitting: Internal Medicine

## 2022-12-12 ENCOUNTER — Inpatient Hospital Stay (HOSPITAL_COMMUNITY): Payer: Medicare HMO

## 2022-12-12 DIAGNOSIS — A419 Sepsis, unspecified organism: Secondary | ICD-10-CM | POA: Diagnosis not present

## 2022-12-12 DIAGNOSIS — J441 Chronic obstructive pulmonary disease with (acute) exacerbation: Secondary | ICD-10-CM

## 2022-12-12 DIAGNOSIS — R569 Unspecified convulsions: Secondary | ICD-10-CM | POA: Diagnosis not present

## 2022-12-12 DIAGNOSIS — J181 Lobar pneumonia, unspecified organism: Secondary | ICD-10-CM | POA: Diagnosis not present

## 2022-12-12 LAB — CBC
HCT: 32.2 % — ABNORMAL LOW (ref 36.0–46.0)
Hemoglobin: 10.5 g/dL — ABNORMAL LOW (ref 12.0–15.0)
MCH: 29.6 pg (ref 26.0–34.0)
MCHC: 32.6 g/dL (ref 30.0–36.0)
MCV: 90.7 fL (ref 80.0–100.0)
Platelets: 307 10*3/uL (ref 150–400)
RBC: 3.55 MIL/uL — ABNORMAL LOW (ref 3.87–5.11)
RDW: 12.2 % (ref 11.5–15.5)
WBC: 19.1 10*3/uL — ABNORMAL HIGH (ref 4.0–10.5)
nRBC: 0 % (ref 0.0–0.2)

## 2022-12-12 LAB — GASTROINTESTINAL PANEL BY PCR, STOOL (REPLACES STOOL CULTURE)

## 2022-12-12 LAB — BASIC METABOLIC PANEL
Anion gap: 7 (ref 5–15)
BUN: 7 mg/dL — ABNORMAL LOW (ref 8–23)
CO2: 22 mmol/L (ref 22–32)
Calcium: 8.3 mg/dL — ABNORMAL LOW (ref 8.9–10.3)
Chloride: 105 mmol/L (ref 98–111)
Creatinine, Ser: 0.65 mg/dL (ref 0.44–1.00)
GFR, Estimated: >60 mL/min (ref >60)
Glucose, Bld: 120 mg/dL — ABNORMAL HIGH (ref 70–99)
Potassium: 3.7 mmol/L (ref 3.5–5.1)
Sodium: 134 mmol/L — ABNORMAL LOW (ref 135–145)

## 2022-12-12 LAB — BLOOD GAS, VENOUS
Acid-Base Excess: 0.9 mmol/L (ref 0.0–2.0)
Bicarbonate: 24 mmol/L (ref 20.0–28.0)
Drawn by: 44828
O2 Saturation: 89.7 %
Patient temperature: 37.4
pCO2, Ven: 34 mmHg — ABNORMAL LOW (ref 44–60)
pH, Ven: 7.46 — ABNORMAL HIGH (ref 7.25–7.43)
pO2, Ven: 54 mmHg — ABNORMAL HIGH (ref 32–45)

## 2022-12-12 LAB — C DIFFICILE QUICK SCREEN W PCR REFLEX
C Diff antigen: NEGATIVE
C Diff interpretation: NOT DETECTED
C Diff toxin: NEGATIVE

## 2022-12-12 LAB — MAGNESIUM: Magnesium: 1.8 mg/dL (ref 1.7–2.4)

## 2022-12-12 MED ORDER — BUDESONIDE 0.5 MG/2ML IN SUSP
0.5000 mg | Freq: Two times a day (BID) | RESPIRATORY_TRACT | Status: DC
  Administered 2022-12-12 – 2022-12-13 (×2): 0.5 mg via RESPIRATORY_TRACT
  Filled 2022-12-12 (×2): qty 2

## 2022-12-12 MED ORDER — METHYLPREDNISOLONE SODIUM SUCC 125 MG IJ SOLR
60.0000 mg | Freq: Two times a day (BID) | INTRAMUSCULAR | Status: DC
  Administered 2022-12-13: 60 mg via INTRAVENOUS
  Filled 2022-12-12: qty 2

## 2022-12-12 MED ORDER — IPRATROPIUM-ALBUTEROL 0.5-2.5 (3) MG/3ML IN SOLN
3.0000 mL | Freq: Four times a day (QID) | RESPIRATORY_TRACT | Status: DC
  Administered 2022-12-12 – 2022-12-13 (×5): 3 mL via RESPIRATORY_TRACT
  Filled 2022-12-12 (×5): qty 3

## 2022-12-12 MED ORDER — METHYLPREDNISOLONE SODIUM SUCC 125 MG IJ SOLR
125.0000 mg | Freq: Once | INTRAMUSCULAR | Status: AC
  Administered 2022-12-12: 125 mg via INTRAVENOUS
  Filled 2022-12-12: qty 2

## 2022-12-12 MED ORDER — ARFORMOTEROL TARTRATE 15 MCG/2ML IN NEBU
15.0000 ug | INHALATION_SOLUTION | Freq: Two times a day (BID) | RESPIRATORY_TRACT | Status: DC
  Administered 2022-12-12 – 2022-12-13 (×2): 15 ug via RESPIRATORY_TRACT
  Filled 2022-12-12 (×2): qty 2

## 2022-12-12 MED ORDER — AMLODIPINE BESYLATE 5 MG PO TABS
5.0000 mg | ORAL_TABLET | Freq: Every day | ORAL | Status: DC
  Administered 2022-12-12 – 2022-12-13 (×2): 5 mg via ORAL
  Filled 2022-12-12 (×2): qty 1

## 2022-12-12 NOTE — Progress Notes (Addendum)
PT Cancellation Note  Patient Details Name: [name] MRN: [id number] DOB: 1950-09-29   Cancelled Treatment:    Reason Eval/Treat Not Completed: Patient at procedure or test/unavailable.  Patient having EEG, will check back when medically stable.   2:58 PM, 12/12/22 Ocie Bob, MPT Physical Therapist with [facility] [phone] office [phone] mobile phone

## 2022-12-12 NOTE — Procedures (Signed)
Patient Name: [name]  MRN: [id number]  Epilepsy Attending: Charlsie Quest  Referring Physician/Provider: Catarina Hartshorn, MD  Date: 12/12/2022 Duration: 22.13 mins  Patient history: 72yo f with ams getting eeg to evaluate for seizure  Level of alertness: Awake  AEDs during EEG study: None  Technical aspects: This EEG study was done with scalp electrodes positioned according to the 10-20 International system of electrode placement. Electrical activity was reviewed with band pass filter of 1-70Hz , sensitivity of 7 uV/mm, display speed of 38mm/sec with a 60Hz  notched filter applied as appropriate. EEG data were recorded continuously and digitally stored.  Video monitoring was available and reviewed as appropriate.  Description: The posterior dominant rhythm consists of 7.5 Hz activity of moderate voltage (25-35 uV) seen predominantly in posterior head regions, symmetric and reactive to eye opening and eye closing. Hyperventilation and photic stimulation were not performed.     IMPRESSION: This study is within normal limits. No seizures or epileptiform discharges were seen throughout the recording.  A normal interictal EEG does not exclude the diagnosis of epilepsy.  Asees Manfredi Annabelle Harman

## 2022-12-12 NOTE — Plan of Care (Signed)
Pt alert and oriented x 4. Up 1 assist to BR. Pt lost IV accesss during the night. Attempted IV access. Limited veins. Unable to obtain. Pt stated 5/20 stuck her multiple times. Notified Dr. Thomes Dinning of last potassium due to IV fluids 1/2 NS with 20KCL. Charts indicate going home today. Will notify am staff. Last IV infiltrated on right arm. Multiple sticks with brushing to left.  Problem: Education: Goal: Knowledge of General Education information will improve Description: Including pain rating scale, medication(s)/side effects and non-pharmacologic comfort measures Outcome: Progressing   Problem: Health Behavior/Discharge Planning: Goal: Ability to manage health-related needs will improve Outcome: Progressing   Problem: Clinical Measurements: Goal: Ability to maintain clinical measurements within normal limits will improve Outcome: Progressing Goal: Will remain free from infection Outcome: Progressing Goal: Diagnostic test results will improve Outcome: Progressing Goal: Respiratory complications will improve Outcome: Progressing Goal: Cardiovascular complication will be avoided Outcome: Progressing   Problem: Activity: Goal: Risk for activity intolerance will decrease Outcome: Progressing   Problem: Nutrition: Goal: Adequate nutrition will be maintained Outcome: Progressing   Problem: Coping: Goal: Level of anxiety will decrease Outcome: Progressing   Problem: Elimination: Goal: Will not experience complications related to bowel motility Outcome: Progressing Goal: Will not experience complications related to urinary retention Outcome: Progressing   Problem: Pain Managment: Goal: General experience of comfort will improve Outcome: Progressing   Problem: Safety: Goal: Ability to remain free from injury will improve Outcome: Progressing   Problem: Skin Integrity: Goal: Risk for impaired skin integrity will decrease Outcome: Progressing

## 2022-12-12 NOTE — Progress Notes (Signed)
PROGRESS NOTE  [name] ZOX:[id number] DOB: 25-Apr-1951 DOA: 12/10/2022 PCP: Estanislado Pandy, MD  Brief History:  72 year old female with a history of coronary disease with stenting, hypertension, hyperlipidemia, impaired glucose tolerance, fibromyalgia presenting with at least 1 week history of fever and generalized weakness.  The patient is a difficult historian.  History is supplemented by review of medical record and speaking with the patient's granddaughter.  Patient granddaughter noted that the patient was a little confused on 12/09/2022.  On 12/10/2022, the patient was found on the floor of her bedroom screaming for help.  The patient's 32 year old mother subsequently contacted the patient's son who helped the patient back into bed.  Apparently the patient was confused at that time.  EMS was activated.  The patient has had intermittent coughing, but denies any chest pain, shortness breath, hemoptysis, nausea, vomiting,  abdominal pain.  The patient has been having loose stools although unable to quantify how frequent over the duration.  There is no been any new medications. One of the patient's son recently died about 2 weeks prior to this admission.  Since then, the patient has had decreased oral intake.  The patient has at least a 50-pack-year history of tobacco, but has been vaping for the last 5 to 6 years. In the ED, the patient was febrile up to 102.0 F.  She was hemodynamically stable with oxygen saturation 98% room air.  WBC 24.1, hemoglobin 9.4, platelets 269,000.  Sodium 131, potassium 2.2, bicarb 23, serum creatinine 0.1.  LFTs were unremarkable.  COVID-19 PCR was negative.  Lactic acid peaked 1.5.  UA was negative for pyuria.  Chest x-ray showed nodular consolidation in the left perihilar region.  CT chest showed masslike consolidation in the superior segment of LLL.  There is also an 8 mm spiculated nodule in the LLL.  The patient was started on ceftriaxone, azithromycin, and IV  fluids.   Assessment/Plan: Sepsis -Present on admission -Presented with fever and leukocytosis -Secondary to pneumonia -Lactic acid peaked 1.5 -Check procalcitonin 0.38 -Follow blood cultures--neg to date -Continue IV fluids -UA neg for pyuria  Acute Respiratory Failure with hypoxia -12/12/22 afternoon--had respiratory distress -CXR--personally reviewed--LUL consolidation, no edema -start solumedrol -start duonebs -start pulmicort -EKG--personally reviewed--sinus, nonspecific T wave change -VBG  Lobar pneumonia -12/10/2022 CT chest as discussed above -Patient will need repeat CT chest in about 4 weeks to ensure resolution and rule out underlying mass/neoplasm -Continue ceftriaxone and azithromycin -Check PCT 0.38   Acute metabolic encephalopathy -Secondary to infectious process -Further workup if does not show continued improvement -UA negative for pyuria -Holding Benadryl, Flexeril, Atarax, gabapentin -improved   Generalized weakness -Multifactorial including infectious process, deconditioning, and electrolyte abnormalities -B12--327 -TSH--0.875 -Folic acid--30.3 -PT evaluation   Syncope -Patient was found on the floor unable to remember what happened -Echocardiogram EF 65-70%, normal RVF, no AS, trivial pericardial eff -EEG -Orthostatics--neg   Diarrhea -C. Difficile--neg -Stool pathogen panel--neg   History of TIA -Continue Plavix   Coronary disease -No chest pain presently -Continue Plavix and statin   Essential hypertension -Restart amlodipine   Depression/anxiety -Continue sertraline and as needed alprazolam -PDMP reviewed -Patient receives alprazolam, #90, last refill 11/22/2022   Obesity -BMI 34.11 -Lifestyle modification               Family Communication:  son updated 5/21   Consultants:  none   Code Status:  FULL    DVT Prophylaxis:   [place] Lovenox  Procedures: As Listed in Progress Note Above   Antibiotics: Ceftriaxone  5/19>> Azithro 5/19>>               Subjective: Pt denies cp but complains of worsen sob and cough.  Denies n/v/d, abd pain.  Objective: Vitals:   12/11/22 1035 12/11/22 1040 12/11/22 1917 12/12/22 0441  BP: (!) 131/51 (!) 154/63 (!) 152/52 (!) 171/65  Pulse: 64 67 68 67  Resp: 18 20 20 18   Temp:  98.6 F (37 C) 98.3 F (36.8 C) 99 F (37.2 C)  TempSrc:   Oral   SpO2: 100% 100% 99% 92%  Weight:      Height:        Intake/Output Summary (Last 24 hours) at 12/12/2022 1455 Last data filed at 12/12/2022 0500 Gross per 24 hour  Intake 1826.89 ml  Output --  Net 1826.89 ml   Weight change:  Exam:  General:  Pt is alert, follows commands appropriately, not in acute distress HEENT: No icterus, No thrush, No neck mass, [place]/AT Cardiovascular: RRR, S1/S2, no rubs, no gallops Respiratory: bilateral wheeze.  Bibasilar rales Abdomen: Soft/+BS, upper abd tender, non distended, no guarding Extremities: No edema, No lymphangitis, No petechiae, No rashes, no synovitis   Data Reviewed: I have personally reviewed following labs and imaging studies Basic Metabolic Panel: Recent Labs  Lab 12/10/22 1800 12/11/22 0957 12/12/22 0415  NA 131* 135 134*  K 3.2* 3.6 3.7  CL 98 104 105  CO2 23 23 22   GLUCOSE 153* 116* 120*  BUN 13 9 7*  CREATININE 0.81 0.68 0.65  CALCIUM 8.4* 8.1* 8.3*  MG  --  1.8 1.8   Liver Function Tests: Recent Labs  Lab 12/10/22 1800  AST 28  ALT 19  ALKPHOS 52  BILITOT 1.1  PROT 6.4*  ALBUMIN 3.2*   No results for input(s): "LIPASE", "AMYLASE" in the last 168 hours. No results for input(s): "AMMONIA" in the last 168 hours. Coagulation Profile: Recent Labs  Lab 12/10/22 1800  INR 1.2   CBC: Recent Labs  Lab 12/10/22 1800 12/11/22 0957 12/12/22 0415  WBC 24.1* 19.3* 19.1*  NEUTROABS 19.8*  --   --   HGB 11.4* 10.4* 10.5*  HCT 33.8* 32.1* 32.2*  MCV 88.3 91.7 90.7  PLT 369 258 307   Cardiac Enzymes: No results for input(s):  "CKTOTAL", "CKMB", "CKMBINDEX", "TROPONINI" in the last 168 hours. BNP: Invalid input(s): "POCBNP" CBG: No results for input(s): "GLUCAP" in the last 168 hours. HbA1C: Recent Labs    12/11/22 0957  HGBA1C 5.2   Urine analysis:    Component Value Date/Time   COLORURINE YELLOW 12/10/2022 2115   APPEARANCEUR CLEAR 12/10/2022 2115   LABSPEC 1.010 12/10/2022 2115   PHURINE 6.5 12/10/2022 2115   GLUCOSEU NEGATIVE 12/10/2022 2115   HGBUR NEGATIVE 12/10/2022 2115   BILIRUBINUR NEGATIVE 12/10/2022 2115   KETONESUR NEGATIVE 12/10/2022 2115   PROTEINUR 100 (A) 12/10/2022 2115   UROBILINOGEN 0.2 12/26/2010 1545   NITRITE NEGATIVE 12/10/2022 2115   LEUKOCYTESUR TRACE (A) 12/10/2022 2115   Sepsis Labs: @LABRCNTIP (procalcitonin:4,lacticidven:4) ) Recent Results (from the past 240 hour(s))  Blood Culture (routine x 2)     Status: None (Preliminary result)   Collection Time: 12/10/22  6:00 PM   Specimen: BLOOD RIGHT ARM  Result Value Ref Range Status   Specimen Description BLOOD RIGHT ARM  Final   Special Requests   Final    BOTTLES DRAWN AEROBIC AND ANAEROBIC Blood Culture adequate volume  Culture   Final    NO GROWTH 2 DAYS Performed at [facility], [address] [place], [place] [address]    Report Status PENDING  Incomplete  Blood Culture (routine x 2)     Status: None (Preliminary result)   Collection Time: 12/10/22  6:11 PM   Specimen: BLOOD RIGHT ARM  Result Value Ref Range Status   Specimen Description BLOOD RIGHT ARM  Final   Special Requests   Final    BOTTLES DRAWN AEROBIC AND ANAEROBIC Blood Culture results may not be optimal due to an inadequate volume of blood received in culture bottles   Culture   Final    NO GROWTH 2 DAYS Performed at [facility], [address] [place], [place] [address]    Report Status PENDING  Incomplete  Group A Strep by PCR     Status: None   Collection Time: 12/10/22  7:20 PM   Specimen: Throat; Sterile Swab  Result Value Ref Range  Status   Group A Strep by PCR NOT DETECTED NOT DETECTED Final    Comment: Performed at [facility], [address] [place], [place] [address]  SARS Coronavirus 2 by RT PCR (hospital order, performed in [facility] hospital lab) *cepheid single result test* Anterior Nasal Swab     Status: None   Collection Time: 12/10/22  8:30 PM   Specimen: Anterior Nasal Swab  Result Value Ref Range Status   SARS Coronavirus 2 by RT PCR NEGATIVE NEGATIVE Final    Comment: (NOTE) SARS-CoV-2 target nucleic acids are NOT DETECTED.  The SARS-CoV-2 RNA is generally detectable in upper and lower respiratory specimens during the acute phase of infection. The lowest concentration of SARS-CoV-2 viral copies this assay can detect is 250 copies / mL. A negative result does not preclude SARS-CoV-2 infection and should not be used as the sole basis for treatment or other patient management decisions.  A negative result may occur with improper specimen collection / handling, submission of specimen other than nasopharyngeal swab, presence of viral mutation(s) within the areas targeted by this assay, and inadequate number of viral copies (<250 copies / mL). A negative result must be combined with clinical observations, patient history, and epidemiological information.  Fact Sheet for Patients:   RoadLapTop.co.za  Fact Sheet for Healthcare Providers: http://kim-miller.com/  This test is not yet approved or  cleared by the [place] FDA and has been authorized for detection and/or diagnosis of SARS-CoV-2 by FDA under an Emergency Use Authorization (EUA).  This EUA will remain in effect (meaning this test can be used) for the duration of the COVID-19 declaration under Section 564(b)(1) of the Act, 21 U.S.C. section 360bbb-3(b)(1), unless the authorization is terminated or revoked sooner.  Performed at [facility], [address] [place], [place] [address]   C  Difficile Quick Screen w PCR reflex     Status: None   Collection Time: 12/12/22  1:35 AM   Specimen: Stool  Result Value Ref Range Status   C Diff antigen NEGATIVE NEGATIVE Final   C Diff toxin NEGATIVE NEGATIVE Final   C Diff interpretation No C. difficile detected.  Final    Comment: Performed at [facility], [address], [place], [place] [address]  Gastrointestinal Panel by PCR , Stool     Status: None   Collection Time: 12/12/22  1:35 AM   Specimen: Stool  Result Value Ref Range Status   Campylobacter species NOT DETECTED NOT DETECTED Final   Plesimonas shigelloides  NOT DETECTED NOT DETECTED Final   Salmonella species NOT DETECTED NOT DETECTED Final   Yersinia enterocolitica NOT DETECTED NOT DETECTED Final   Vibrio species NOT DETECTED NOT DETECTED Final   Vibrio cholerae NOT DETECTED NOT DETECTED Final   Enteroaggregative E coli (EAEC) NOT DETECTED NOT DETECTED Final   Enteropathogenic E coli (EPEC) NOT DETECTED NOT DETECTED Final   Enterotoxigenic E coli (ETEC) NOT DETECTED NOT DETECTED Final   Shiga like toxin producing E coli (STEC) NOT DETECTED NOT DETECTED Final   Shigella/Enteroinvasive E coli (EIEC) NOT DETECTED NOT DETECTED Final   Cryptosporidium NOT DETECTED NOT DETECTED Final   Cyclospora cayetanensis NOT DETECTED NOT DETECTED Final   Entamoeba histolytica NOT DETECTED NOT DETECTED Final   Giardia lamblia NOT DETECTED NOT DETECTED Final   Adenovirus F40/41 NOT DETECTED NOT DETECTED Final   Astrovirus NOT DETECTED NOT DETECTED Final   Norovirus GI/GII NOT DETECTED NOT DETECTED Final   Rotavirus A NOT DETECTED NOT DETECTED Final   Sapovirus (I, II, IV, and V) NOT DETECTED NOT DETECTED Final    Comment: Performed at [facility], [address] [place], [place] [address]     Scheduled Meds:  amLODipine  5 mg Oral Daily   atorvastatin  20 mg Oral Daily   budesonide (PULMICORT) nebulizer solution  0.5 mg Nebulization BID   clopidogrel  75 mg Oral Daily    enoxaparin (LOVENOX) injection  40 mg Subcutaneous Q24H   ipratropium-albuterol  3 mL Nebulization Q6H   methylPREDNISolone (SOLU-MEDROL) injection  125 mg Intravenous Once   nystatin  5 mL Oral QID   sertraline  25 mg Oral Daily   Continuous Infusions:  0.9 % NaCl with KCl 20 mEq / L Stopped (12/11/22 2330)   azithromycin 500 mg (12/11/22 2142)   cefTRIAXone (ROCEPHIN)  IV 200 mL/hr at 12/11/22 1014    Procedures/Studies: ECHOCARDIOGRAM COMPLETE  Result Date: 12/11/2022    ECHOCARDIOGRAM REPORT   Patient Name:   AZARYA BINDA Date of Exam: 12/11/2022 Medical Rec #:  [id number]    Height:       62.0 in Accession #:    [id number]   Weight:       186.5 lb Date of Birth:  [birth year]     BSA:          1.856 m Patient Age:    72 years     BP:           154/63 mmHg Patient Gender: F            HR:           86 bpm. [facility] Procedure: 2D Echo, Cardiac Doppler and Color Doppler Indications:    Syncope R55  History:        Patient has prior history of Echocardiogram examinations, most                 recent 08/22/2022. CAD, TIA and Sepsis, Signs/Symptoms:Shortness                 of Breath; Risk Factors:Hypertension and Dyslipidemia.  Sonographer:    Aron Baba Referring Phys: [phone] Xzaria Teo  Sonographer Comments: Image acquisition challenging due to respiratory motion. IMPRESSIONS  1. Left ventricular ejection fraction, by estimation, is 65 to 70%. The left ventricle has normal function. Left ventricular endocardial border not optimally defined to evaluate regional wall motion. Left ventricular diastolic parameters are indeterminate. Elevated left atrial pressure.  2. Right ventricular  systolic function is normal. The right ventricular size is normal. There is mildly elevated pulmonary artery systolic pressure.  3. Left atrial size was severely dilated.  4. The mitral valve is normal in structure. No evidence of mitral valve regurgitation. No evidence of mitral stenosis.  5. The tricuspid  valve is abnormal.  6. The aortic valve was not well visualized. Aortic valve regurgitation is not visualized. No aortic stenosis is present.  7. The inferior vena cava is normal in size with greater than 50% respiratory variability, suggesting right atrial pressure of 3 mmHg. FINDINGS  Left Ventricle: Left ventricular ejection fraction, by estimation, is 65 to 70%. The left ventricle has normal function. Left ventricular endocardial border not optimally defined to evaluate regional wall motion. The left ventricular internal cavity size was normal in size. There is no left ventricular hypertrophy. Left ventricular diastolic parameters are indeterminate. Elevated left atrial pressure. Right Ventricle: The right ventricular size is normal. Right vetricular wall thickness was not well visualized. Right ventricular systolic function is normal. There is mildly elevated pulmonary artery systolic pressure. The tricuspid regurgitant velocity  is 2.91 m/s, and with an assumed right atrial pressure of 3 mmHg, the estimated right ventricular systolic pressure is 36.9 mmHg. Left Atrium: Left atrial size was severely dilated. Right Atrium: Right atrial size was normal in size. Pericardium: Trivial pericardial effusion is present. The pericardial effusion is anterior to the right ventricle. Mitral Valve: The mitral valve is normal in structure. No evidence of mitral valve regurgitation. No evidence of mitral valve stenosis. Tricuspid Valve: The tricuspid valve is abnormal. Tricuspid valve regurgitation is mild . No evidence of tricuspid stenosis. Aortic Valve: The aortic valve was not well visualized. Aortic valve regurgitation is not visualized. No aortic stenosis is present. Aortic valve mean gradient measures 6.2 mmHg. Aortic valve peak gradient measures 11.6 mmHg. Pulmonic Valve: The pulmonic valve was not well visualized. Pulmonic valve regurgitation is not visualized. No evidence of pulmonic stenosis. Aorta: The aortic root  is normal in size and structure. Venous: The inferior vena cava is normal in size with greater than 50% respiratory variability, suggesting right atrial pressure of 3 mmHg. IAS/Shunts: The interatrial septum was not well visualized.  LEFT VENTRICLE PLAX 2D LVIDd:         3.40 cm Diastology LVIDs:         2.30 cm LV e' medial:    8.67 cm/s LV PW:         0.90 cm LV E/e' medial:  19.3 LV IVS:        0.90 cm LV e' lateral:   12.70 cm/s                        LV E/e' lateral: 13.1  RIGHT VENTRICLE RV S prime:     11.70 cm/s TAPSE (M-mode): 2.5 cm LEFT ATRIUM             Index        RIGHT ATRIUM           Index LA diam:        3.70 cm 1.99 cm/m   RA Area:     21.10 cm LA Vol (A2C):   99.5 ml 53.62 ml/m  RA Volume:   67.20 ml  36.21 ml/m LA Vol (A4C):   89.1 ml 48.01 ml/m LA Biplane Vol: 93.9 ml 50.60 ml/m  AORTIC VALVE AV Vmax:      169.93 cm/s AV Vmean:  113.259 cm/s AV VTI:       0.365 m AV Peak Grad: 11.6 mmHg AV Mean Grad: 6.2 mmHg  AORTA Ao Root diam: 2.80 cm MITRAL VALVE                TRICUSPID VALVE MV Area (PHT): 4.96 cm     TR Peak grad:   33.9 mmHg MV Decel Time: 153 msec     TR Vmax:        291.00 cm/s MR Peak grad: 4.9 mmHg MR Vmax:      111.00 cm/s MV E velocity: 167.00 cm/s MV A velocity: 85.70 cm/s MV E/A ratio:  1.95 Dina Rich MD Electronically signed by Dina Rich MD Signature Date/Time: 12/11/2022/2:49:00 PM    Final    CT Chest Wo Contrast  Result Date: 12/10/2022 CLINICAL DATA:  Abnormal xray - lung nodule, >= 1 cm, fever EXAM: CT CHEST WITHOUT CONTRAST TECHNIQUE: Multidetector CT imaging of the chest was performed following the standard protocol without IV contrast. RADIATION DOSE REDUCTION: This exam was performed according to the departmental dose-optimization program which includes automated exposure control, adjustment of the mA and/or kV according to patient size and/or use of iterative reconstruction technique. COMPARISON:  None Available. FINDINGS: Cardiovascular:  Moderate multi-vessel coronary artery calcification with prior left anterior descending coronary artery stenting. Global cardiac size within normal limits. No pericardial effusion. Central pulmonary arteries are of normal caliber. Moderate atherosclerotic calcification within the thoracic aorta. No aortic aneurysm. Particularly prominent atherosclerotic calcifications seen at the origin of the left subclavian artery, however, the degree of stenosis is not well assessed on this noncontrast examination. Mediastinum/Nodes: Visualized thyroid is unremarkable. Shotty right paratracheal and prevascular lymphadenopathy is present, nonspecific, possibly reactive in nature. The esophagus is unremarkable. Lungs/Pleura: There is masslike consolidation within the superior segment of the left lower lobe which neither obliterates nor demonstrates significant mass effect upon the traversing bronchi most in keeping with an inflammatory process such as round pneumonia. Mean 8 mm slightly spiculated pulmonary nodule noted within the left lower lobe, axial image # 86/4, indeterminate. No pneumothorax or pleural effusion. No central obstructing lesion. Upper Abdomen: No acute abnormality.  Status post cholecystectomy Musculoskeletal: Osseous structures are age-appropriate. No acute bone abnormality. No lytic or blastic bone lesion IMPRESSION: 1. Masslike consolidation within the superior segment of the left lower lobe most in keeping with an inflammatory process such as round pneumonia in the acute setting. Follow-up chest radiograph is recommended, however, in 3-4 weeks to document complete resolution. 2. Mean 8 mm slightly spiculated pulmonary nodule within the left lower lobe, indeterminate. Non-contrast chest CT at 6-12 months is recommended. If the nodule is stable at time of repeat CT, then future CT at 18-24 months (from today's scan) is considered optional for low-risk patients, but is recommended for high-risk patients. This  recommendation follows the consensus statement: Guidelines for Management of Incidental Pulmonary Nodules Detected on CT Images: From the Fleischner Society 2017; Radiology 2017; 284:228-243. 3. Moderate multi-vessel coronary artery calcification with prior left anterior descending coronary artery stenting. 4. Moderate atherosclerotic calcification within the thoracic aorta. Particularly prominent atherosclerotic calcifications seen at the origin of the left subclavian artery, however, the degree of stenosis is not well assessed on this noncontrast examination. If there is clinical evidence of subclavian steal, this could be better assessed with CT arteriography. Aortic Atherosclerosis (ICD10-I70.0). Electronically Signed   By: Helyn Numbers M.D.   On: 12/10/2022 19:46   DG Chest [facility] 1 View  Result  Date: 12/10/2022 CLINICAL DATA:  Fever, questionable sepsis EXAM: PORTABLE CHEST 1 VIEW COMPARISON:  12/26/2010, 08/22/2022 FINDINGS: Single frontal view of the chest demonstrates a unremarkable cardiac silhouette. 6.1 cm rounded masslike area of consolidation projects in the left perihilar region. There is no abnormality in this region on the prior CTA neck performed 08/22/2022. The remainder of the lungs are clear. No effusion or pneumothorax. No acute bony abnormality. Stable postsurgical changes right shoulder. IMPRESSION: 1. 6.1 cm rounded masslike consolidation within the left hilar region. Differential diagnosis includes rounded pneumonia versus neoplasm. CT chest with IV contrast may be useful for further evaluation. Electronically Signed   By: Sharlet Salina M.D.   On: 12/10/2022 18:21   CT Head Wo Contrast  Result Date: 12/10/2022 CLINICAL DATA:  Severe headache.  Fever. EXAM: CT HEAD WITHOUT CONTRAST TECHNIQUE: Contiguous axial images were obtained from the base of the skull through the vertex without intravenous contrast. RADIATION DOSE REDUCTION: This exam was performed according to the departmental  dose-optimization program which includes automated exposure control, adjustment of the mA and/or kV according to patient size and/or use of iterative reconstruction technique. COMPARISON:  08/22/2022 FINDINGS: Brain: No evidence of intracranial hemorrhage, acute infarction, hydrocephalus, extra-axial collection, or mass lesion/mass effect. Mild diffuse cerebral and cerebellar atrophy shows no significant change. Vascular:  No hyperdense vessel or other acute findings. Skull: No evidence of fracture or other significant bone abnormality. Sinuses/Orbits:  No acute findings. Other: None. IMPRESSION: No acute intracranial abnormality. Stable mild diffuse cerebral and cerebellar atrophy. Electronically Signed   By: Danae Orleans M.D.   On: 12/10/2022 18:18    Catarina Hartshorn, DO  [facility]  If 7PM-7AM, please contact night-coverage [email or web address] Password [email or web address] 12/12/2022, 2:55 PM   LOS: 1 day

## 2022-12-12 NOTE — Progress Notes (Signed)
EEG complete - results pending 

## 2022-12-12 NOTE — Progress Notes (Signed)
Mobility Specialist Progress Note:    12/12/22 1158  Mobility  Activity Ambulated independently to bathroom  Level of Assistance Contact guard assist, steadying assist  Assistive Device None  Distance Ambulated (ft) 24 ft  Range of Motion/Exercises Active;All extremities  Activity Response Tolerated well  Mobility Referral Yes  $Mobility charge 1 Mobility  Mobility Specialist Start Time (ACUTE ONLY) 1158  Mobility Specialist Stop Time (ACUTE ONLY) 1215  Mobility Specialist Time Calculation (min) (ACUTE ONLY) 17 min   Responded to pt's bed alarm. Pt received in bathroom. Assisted back to bed, Tolerated well. All needs met, call bell in reach, alarm on.   Feliciana Rossetti Mobility Specialist Please contact via Special educational needs teacher or  Rehab office at [phone]

## 2022-12-12 NOTE — Progress Notes (Signed)
Mobility Specialist Progress Note:   12/12/22 1035  Mobility  Activity Ambulated with assistance in room  Level of Assistance Modified independent, requires aide device or extra time  Assistive Device None  Distance Ambulated (ft) 70 ft  Range of Motion/Exercises Active;All extremities  Activity Response Tolerated well  Mobility Referral Yes  $Mobility charge 1 Mobility  Mobility Specialist Start Time (ACUTE ONLY) 1035  Mobility Specialist Stop Time (ACUTE ONLY) 1050  Mobility Specialist Time Calculation (min) (ACUTE ONLY) 15 min   Pt agreeable to mobility session. Prior to ambulating, pt c/o L/R thigh pain. Tolerated session well, slight SOB throughout, SpO2 92% on RA. Pt sat EOB, recovered, SpO2 95% on RA. Assisted pt back in bed, all needs met, bed alarm on.  Feliciana Rossetti Mobility Specialist Please contact via Special educational needs teacher or  Rehab office at [phone]

## 2022-12-13 DIAGNOSIS — J181 Lobar pneumonia, unspecified organism: Secondary | ICD-10-CM | POA: Diagnosis not present

## 2022-12-13 DIAGNOSIS — J189 Pneumonia, unspecified organism: Secondary | ICD-10-CM

## 2022-12-13 DIAGNOSIS — I10 Essential (primary) hypertension: Secondary | ICD-10-CM | POA: Diagnosis not present

## 2022-12-13 DIAGNOSIS — J441 Chronic obstructive pulmonary disease with (acute) exacerbation: Secondary | ICD-10-CM

## 2022-12-13 DIAGNOSIS — A419 Sepsis, unspecified organism: Secondary | ICD-10-CM | POA: Diagnosis not present

## 2022-12-13 DIAGNOSIS — E669 Obesity, unspecified: Secondary | ICD-10-CM

## 2022-12-13 LAB — BASIC METABOLIC PANEL
Anion gap: 9 (ref 5–15)
BUN: 7 mg/dL — ABNORMAL LOW (ref 8–23)
CO2: 22 mmol/L (ref 22–32)
Calcium: 8.5 mg/dL — ABNORMAL LOW (ref 8.9–10.3)
Chloride: 105 mmol/L (ref 98–111)
Creatinine, Ser: 0.51 mg/dL (ref 0.44–1.00)
GFR, Estimated: >60 mL/min (ref >60)
Glucose, Bld: 175 mg/dL — ABNORMAL HIGH (ref 70–99)
Potassium: 3.3 mmol/L — ABNORMAL LOW (ref 3.5–5.1)
Sodium: 136 mmol/L (ref 135–145)

## 2022-12-13 LAB — CBC
HCT: 31.7 % — ABNORMAL LOW (ref 36.0–46.0)
Hemoglobin: 10.3 g/dL — ABNORMAL LOW (ref 12.0–15.0)
MCH: 29.1 pg (ref 26.0–34.0)
MCHC: 32.5 g/dL (ref 30.0–36.0)
MCV: 89.5 fL (ref 80.0–100.0)
Platelets: 301 10*3/uL (ref 150–400)
RBC: 3.54 MIL/uL — ABNORMAL LOW (ref 3.87–5.11)
RDW: 12.1 % (ref 11.5–15.5)
WBC: 12 10*3/uL — ABNORMAL HIGH (ref 4.0–10.5)
nRBC: 0 % (ref 0.0–0.2)

## 2022-12-13 LAB — MAGNESIUM: Magnesium: 1.9 mg/dL (ref 1.7–2.4)

## 2022-12-13 MED ORDER — TRIAMCINOLONE ACETONIDE 0.1 % EX CREA: 1.0000 | TOPICAL_CREAM | Freq: Every day | CUTANEOUS | Status: AC | PRN

## 2022-12-13 MED ORDER — DM-GUAIFENESIN ER 30-600 MG PO TB12: 1.0000 | ORAL_TABLET | Freq: Two times a day (BID) | ORAL | 0 refills | Status: AC

## 2022-12-13 MED ORDER — AZITHROMYCIN 250 MG PO TABS: 250.0000 mg | ORAL_TABLET | Freq: Every day | ORAL | 0 refills | Status: AC

## 2022-12-13 MED ORDER — CEFDINIR 300 MG PO CAPS: 300.0000 mg | ORAL_CAPSULE | Freq: Two times a day (BID) | ORAL | 0 refills | Status: AC

## 2022-12-13 MED ORDER — FUROSEMIDE 40 MG PO TABS: 20.0000 mg | ORAL_TABLET | Freq: Every day | ORAL | Status: AC

## 2022-12-13 MED ORDER — PREDNISONE 10 MG PO TABS: ORAL_TABLET | ORAL | 0 refills | Status: AC

## 2022-12-13 MED ORDER — PANTOPRAZOLE SODIUM 40 MG PO TBEC: 40.0000 mg | DELAYED_RELEASE_TABLET | Freq: Every day | ORAL | 0 refills | Status: AC

## 2022-12-13 MED ORDER — NYSTATIN 100000 UNIT/ML MT SUSP: 5.0000 mL | Freq: Four times a day (QID) | OROMUCOSAL | 0 refills | Status: AC

## 2022-12-13 NOTE — Progress Notes (Signed)
Discharge instructions reviewed with patient and she verbalized understanding.

## 2022-12-13 NOTE — Care Management Important Message (Signed)
Important Message  Patient Details  Name: [name] MRN: [id number] Date of Birth: [birth year]   Medicare Important Message Given:  N/A - LOS <3 / Initial given by admissions     Corey Harold 12/13/2022, 1:24 PM

## 2022-12-13 NOTE — Discharge Summary (Signed)
Physician Discharge Summary   Patient: [name] MRN: [id number] DOB: 02-May-1951  Admit date:     12/10/2022  Discharge date: 12/13/22  Discharge Physician: Vassie Loll   PCP: Estanislado Pandy, MD   Recommendations at discharge:  Repeat basic metabolic panel to follow electrolytes and renal function Reassess blood pressure and adjust antihypertensive regimen Repeat CT chest in about 4-6 weeks to assure complete resolution of infiltrate and rule out underlying mass/neoplasm. Continue to assist patient with weight loss management.  Discharge Diagnoses: Principal Problem:   Lobar pneumonia (HCC) Active Problems:   Coronary artery disease involving native heart with angina pectoris (HCC)   TIA (transient ischemic attack)   Headache   Essential hypertension   Mixed hyperlipidemia   Fibromyalgia   CAP (community acquired pneumonia)   Community acquired pneumonia   Sepsis due to undetermined organism (HCC)   COPD with acute exacerbation (HCC)   Class 1 obesity  Hospital Course: 72 year old female with a history of coronary disease with stenting, hypertension, hyperlipidemia, impaired glucose tolerance, fibromyalgia presenting with at least 1 week history of fever and generalized weakness.  The patient is a difficult historian.  History is supplemented by review of medical record and speaking with the patient's granddaughter.  Patient granddaughter noted that the patient was a little confused on 12/09/2022.  On 12/10/2022, the patient was found on the floor of her bedroom screaming for help.  The patient's 56 year old mother subsequently contacted the patient's son who helped the patient back into bed.  Apparently the patient was confused at that time.  EMS was activated.  The patient has had intermittent coughing, but denies any chest pain, shortness breath, hemoptysis, nausea, vomiting,  abdominal pain.  The patient has been having loose stools although unable to quantify how frequent over  the duration.  There is no been any new medications. One of the patient's son recently died about 2 weeks prior to this admission.  Since then, the patient has had decreased oral intake.  The patient has at least a 50-pack-year history of tobacco, but has been vaping for the last 5 to 6 years. In the ED, the patient was febrile up to 102.0 F.  She was hemodynamically stable with oxygen saturation 98% room air.  WBC 24.1, hemoglobin 9.4, platelets 269,000.  Sodium 131, potassium 2.2, bicarb 23, serum creatinine 0.1.  LFTs were unremarkable.  COVID-19 PCR was negative.  Lactic acid peaked 1.5.  UA was negative for pyuria.  Chest x-ray showed nodular consolidation in the left perihilar region.  CT chest showed masslike consolidation in the superior segment of LLL.  There is also an 8 mm spiculated nodule in the LLL.  The patient was started on ceftriaxone, azithromycin, and IV fluids.  Assessment and Plan: Sepsis -Present on admission -Presented with fever and leukocytosis -Secondary to pneumonia -Lactic acid peaked 1.5 -Check procalcitonin 0.38 -Blood culture remains negative at time of discharge -Patient advised to maintain adequate hydration and to complete oral antibiotics as instructed. -Sepsis features resolved at discharge.   Acute Respiratory Failure with hypoxia -12/12/22 afternoon--had respiratory distress -CXR--personally reviewed--LUL consolidation, no edema -Most likely triggered by COPD exacerbation in the setting of acute pneumonia -Continue steroid tapering's, oral antibiotics and supportive care. -Prescription for Mucinex provided at discharge. -EKG--personally reviewed--sinus, nonspecific T wave change -No oxygen supplementation required at time of discharge.   Lobar pneumonia -12/10/2022 CT chest as discussed above -Patient will need repeat CT chest in about 4 weeks to ensure resolution and rule  out underlying mass/neoplasm -Continue the use of oral cefdinir and Zithromax to  complete antibiotic therapy. -Check PCT 0.38   Acute metabolic encephalopathy -Secondary to infectious process -Further workup if does not show continued improvement -UA negative for pyuria -Continue patient follow-up with PCP. -improved/resolved at time of discharge.   Generalized weakness/dehydration. -Multifactorial including infectious process, deconditioning, and electrolyte abnormalities -B12--327 -TSH--0.875 -Folic acid--30.3 -PT evaluation appreciated; safe to return home at time of discharge.   Syncope -Patient was found on the floor unable to remember what happened -Echocardiogram EF 65-70%, normal RVF, no AS, trivial pericardial eff -EEG without acute epileptic waves or seizure abnormalities. -Orthostatics--neg -Patient advised to take time when changing positions and to maintain adequate hydration. -Patient condition most likely associated with acute infection and dehydration.   Diarrhea -C. Difficile--neg -Stool pathogen panel--neg -Diarrhea resolved at time of discharge.   History of TIA -Continue Plavix secondary prevention -Continue risk factor modification.   Coronary disease -No chest pain presently -Continue Plavix and statin -Continue heart healthy diet -Continue patient follow-up with cardiology service.   Essential hypertension -Resume home tensive agents -Heart healthy diet recommended.   Depression/anxiety -Continue sertraline and as needed alprazolam -PDMP reviewed -Continue patient follow-up with PCP/psychiatry service if needed.   Obesity -BMI 34.11 -Low-calorie diet, portion control and increase physical activity discussed with patient.   Consultants: None Procedures performed: See below for x-ray report; EEG negative for epileptic wave abnormalities. Disposition: Home Diet recommendation: Heart healthy/low calorie diet.  DISCHARGE MEDICATION: Allergies as of 12/13/2022       Reactions   Iodine Swelling   Red spots on skin  after cath, swollen lips and eyes   Iohexol Swelling   Red spots on skin after cath.  Swollen lips and eyes.     Ambien [zolpidem] Other (See Comments)   hallucinations   Adhesive [tape] Dermatitis   Plastic tape causes skin irritation    Niacin Rash   MAY BE PREDICTABLE RESPONSE TO NIACIN   Penicillins Rash        Medication List     STOP taking these medications    aspirin EC 81 MG tablet   hydrOXYzine 25 MG tablet Commonly known as: ATARAX       TAKE these medications    acetaminophen 500 MG tablet Commonly known as: TYLENOL Take 1,000 mg by mouth every 6 (six) hours as needed for mild pain or moderate pain.   ALPRAZolam 0.5 MG tablet Commonly known as: XANAX Take 0.5 mg by mouth 3 (three) times daily as needed for anxiety.   amLODipine 10 MG tablet Commonly known as: NORVASC Take 10 mg by mouth daily.   atorvastatin 20 MG tablet Commonly known as: Lipitor Take 1 tablet (20 mg total) by mouth daily. What changed: how much to take   azithromycin 250 MG tablet Commonly known as: Zithromax Z-Pak Take 1 tablet (250 mg total) by mouth daily for 5 days.   cefdinir 300 MG capsule Commonly known as: OMNICEF Take 1 capsule (300 mg total) by mouth 2 (two) times daily for 5 days.   cholecalciferol 1000 units tablet Commonly known as: VITAMIN D Take 1,000 Units by mouth daily.   clobetasol 0.05 % external solution Commonly known as: TEMOVATE Apply 1 application  topically 2 (two) times daily as needed (rash/irritation). scalp   clopidogrel 75 MG tablet Commonly known as: Plavix Take 1 tablet (75 mg total) by mouth daily. - take Aspirin 81 mg daily along with Plavix 75 mg daily for  21 days then after that STOP the Aspirin and continue ONLY Plavix 75 mg daily indefinitely--for secondary stroke Prevention What changed: additional instructions   cyclobenzaprine 10 MG tablet Commonly known as: FLEXERIL Take 10 mg by mouth 3 (three) times daily as needed for  muscle spasms.   dextromethorphan-guaiFENesin 30-600 MG 12hr tablet Commonly known as: MUCINEX DM Take 1 tablet by mouth 2 (two) times daily for 7 days.   diphenhydrAMINE 25 mg capsule Commonly known as: BENADRYL Take 25 mg by mouth every 6 (six) hours as needed for itching or allergies.   furosemide 40 MG tablet Commonly known as: LASIX Take 0.5 tablets (20 mg total) by mouth daily. What changed: how much to take   gabapentin 400 MG capsule Commonly known as: NEURONTIN Take 800 mg by mouth 2 (two) times daily.   multivitamin with minerals tablet Take 1 tablet by mouth daily. Women's One-A-Day   nitroGLYCERIN 0.4 MG SL tablet Commonly known as: NITROSTAT Place 1 tablet (0.4 mg total) under the tongue every 5 (five) minutes x 3 doses as needed for chest pain (if no relief after 3rd dose, proceed to the ED or call 911).   nystatin 100000 UNIT/ML suspension Commonly known as: MYCOSTATIN Take 5 mLs (500,000 Units total) by mouth 4 (four) times daily for 7 days.   pantoprazole 40 MG tablet Commonly known as: Protonix Take 1 tablet (40 mg total) by mouth daily.   predniSONE 10 MG tablet Commonly known as: DELTASONE Take 4 tablets by mouth daily x 1 day; then 3 tablets by mouth daily x 1 day; then 2 tablets by mouth daily x 2 days; then 1 tablet by mouth daily x 3 days and stop Prednisone.   sertraline 25 MG tablet Commonly known as: ZOLOFT Take 50 mg by mouth daily.   traMADol 50 MG tablet Commonly known as: Ultram One tablet every six hours as needed for pain. What changed:  how much to take how to take this when to take this reasons to take this   triamcinolone cream 0.1 % Commonly known as: KENALOG Apply 1 Application topically daily as needed (skin irritation).        Follow-up Information     Sasser, Clarene Critchley, MD. Schedule an appointment as soon as possible for a visit in 10 day(s).   Specialty: Family Medicine Contact information: [address] [place]  [address] [phone]                Discharge Exam: Ceasar Mons Weights   12/10/22 1725 12/10/22 2242  Weight: 82 kg 84.6 kg   General exam: Alert, awake, oriented x 3; in no acute distress, speaking in full sentences and no requiring oxygen supplementation.  Patient was afebrile and tolerating oral medication regimen. Respiratory system: Positive rhonchi bilaterally; no using accessory muscles.  Good saturation on room air. Cardiovascular system:RRR. No rubs or gallops; no JVD. Gastrointestinal system: Abdomen is obese, nondistended, soft and nontender. No organomegaly or masses felt. Normal bowel sounds heard. Central nervous system: Alert and oriented. No focal neurological deficits. Extremities: No cyanosis or clubbing Skin: No petechiae. Psychiatry: Judgement and insight appear normal. Mood & affect appropriate.    Condition at discharge: good  The results of significant diagnostics from this hospitalization (including imaging, microbiology, ancillary and laboratory) are listed below for reference.   Imaging Studies: EEG adult  Result Date: 2023-01-11 Charlsie Quest, MD     11-Jan-2023  7:21 PM Patient Name: KENNYA OJO MRN: [id number] Epilepsy  Attending: Charlsie Quest Referring Physician/Provider: Catarina Hartshorn, MD Date: 12/12/2022 Duration: 22.13 mins Patient history: 72yo f with ams getting eeg to evaluate for seizure Level of alertness: Awake AEDs during EEG study: None Technical aspects: This EEG study was done with scalp electrodes positioned according to the 10-20 International system of electrode placement. Electrical activity was reviewed with band pass filter of 1-70Hz , sensitivity of 7 uV/mm, display speed of 56mm/sec with a 60Hz  notched filter applied as appropriate. EEG data were recorded continuously and digitally stored.  Video monitoring was available and reviewed as appropriate. Description: The posterior dominant rhythm consists of 7.5 Hz activity of moderate voltage  (25-35 uV) seen predominantly in posterior head regions, symmetric and reactive to eye opening and eye closing. Hyperventilation and photic stimulation were not performed.   IMPRESSION: This study is within normal limits. No seizures or epileptiform discharges were seen throughout the recording. A normal interictal EEG does not exclude the diagnosis of epilepsy. Priyanka Annabelle Harman   DG CHEST PORT 1 VIEW  Result Date: 12/12/2022 CLINICAL DATA:  Respiratory distress, hypertension EXAM: PORTABLE CHEST 1 VIEW COMPARISON:  12/10/2022 FINDINGS: Single frontal view of the chest demonstrates a stable cardiac silhouette. Increased vascular congestion since prior study without overt edema. Masslike consolidation within the superior segment of the left lower lobe is unchanged. No effusion or pneumothorax. No acute bony abnormalities. IMPRESSION: 1. Stable left lower lobe consolidation, most consistent with pneumonia. Continued follow-up again recommended to document resolution. 2. Increased vascular congestion without overt edema. Electronically Signed   By: Sharlet Salina M.D.   On: 12/12/2022 15:23   ECHOCARDIOGRAM COMPLETE  Result Date: 12/11/2022    ECHOCARDIOGRAM REPORT   Patient Name:   [name] Date of Exam: 12/11/2022 Medical Rec #:  [id number]    Height:       62.0 in Accession #:    [id number]   Weight:       186.5 lb Date of Birth:  05/08/1951     BSA:          1.856 m Patient Age:    72 years     BP:           154/63 mmHg Patient Gender: F            HR:           86 bpm. [facility] Procedure: 2D Echo, Cardiac Doppler and Color Doppler Indications:    Syncope R55  History:        Patient has prior history of Echocardiogram examinations, most                 recent 08/22/2022. CAD, TIA and Sepsis, Signs/Symptoms:Shortness                 of Breath; Risk Factors:Hypertension and Dyslipidemia.  Sonographer:    Aron Baba Referring Phys: [phone] DAVID TAT  Sonographer Comments: Image acquisition  challenging due to respiratory motion. IMPRESSIONS  1. Left ventricular ejection fraction, by estimation, is 65 to 70%. The left ventricle has normal function. Left ventricular endocardial border not optimally defined to evaluate regional wall motion. Left ventricular diastolic parameters are indeterminate. Elevated left atrial pressure.  2. Right ventricular systolic function is normal. The right ventricular size is normal. There is mildly elevated pulmonary artery systolic pressure.  3. Left atrial size was severely dilated.  4. The mitral valve is normal in structure. No evidence of mitral valve regurgitation. No evidence of  mitral stenosis.  5. The tricuspid valve is abnormal.  6. The aortic valve was not well visualized. Aortic valve regurgitation is not visualized. No aortic stenosis is present.  7. The inferior vena cava is normal in size with greater than 50% respiratory variability, suggesting right atrial pressure of 3 mmHg. FINDINGS  Left Ventricle: Left ventricular ejection fraction, by estimation, is 65 to 70%. The left ventricle has normal function. Left ventricular endocardial border not optimally defined to evaluate regional wall motion. The left ventricular internal cavity size was normal in size. There is no left ventricular hypertrophy. Left ventricular diastolic parameters are indeterminate. Elevated left atrial pressure. Right Ventricle: The right ventricular size is normal. Right vetricular wall thickness was not well visualized. Right ventricular systolic function is normal. There is mildly elevated pulmonary artery systolic pressure. The tricuspid regurgitant velocity  is 2.91 m/s, and with an assumed right atrial pressure of 3 mmHg, the estimated right ventricular systolic pressure is 36.9 mmHg. Left Atrium: Left atrial size was severely dilated. Right Atrium: Right atrial size was normal in size. Pericardium: Trivial pericardial effusion is present. The pericardial effusion is anterior to  the right ventricle. Mitral Valve: The mitral valve is normal in structure. No evidence of mitral valve regurgitation. No evidence of mitral valve stenosis. Tricuspid Valve: The tricuspid valve is abnormal. Tricuspid valve regurgitation is mild . No evidence of tricuspid stenosis. Aortic Valve: The aortic valve was not well visualized. Aortic valve regurgitation is not visualized. No aortic stenosis is present. Aortic valve mean gradient measures 6.2 mmHg. Aortic valve peak gradient measures 11.6 mmHg. Pulmonic Valve: The pulmonic valve was not well visualized. Pulmonic valve regurgitation is not visualized. No evidence of pulmonic stenosis. Aorta: The aortic root is normal in size and structure. Venous: The inferior vena cava is normal in size with greater than 50% respiratory variability, suggesting right atrial pressure of 3 mmHg. IAS/Shunts: The interatrial septum was not well visualized.  LEFT VENTRICLE PLAX 2D LVIDd:         3.40 cm Diastology LVIDs:         2.30 cm LV e' medial:    8.67 cm/s LV PW:         0.90 cm LV E/e' medial:  19.3 LV IVS:        0.90 cm LV e' lateral:   12.70 cm/s                        LV E/e' lateral: 13.1  RIGHT VENTRICLE RV S prime:     11.70 cm/s TAPSE (M-mode): 2.5 cm LEFT ATRIUM             Index        RIGHT ATRIUM           Index LA diam:        3.70 cm 1.99 cm/m   RA Area:     21.10 cm LA Vol (A2C):   99.5 ml 53.62 ml/m  RA Volume:   67.20 ml  36.21 ml/m LA Vol (A4C):   89.1 ml 48.01 ml/m LA Biplane Vol: 93.9 ml 50.60 ml/m  AORTIC VALVE AV Vmax:      169.93 cm/s AV Vmean:     113.259 cm/s AV VTI:       0.365 m AV Peak Grad: 11.6 mmHg AV Mean Grad: 6.2 mmHg  AORTA Ao Root diam: 2.80 cm MITRAL VALVE  TRICUSPID VALVE MV Area (PHT): 4.96 cm     TR Peak grad:   33.9 mmHg MV Decel Time: 153 msec     TR Vmax:        291.00 cm/s MR Peak grad: 4.9 mmHg MR Vmax:      111.00 cm/s MV E velocity: 167.00 cm/s MV A velocity: 85.70 cm/s MV E/A ratio:  1.95 Dina Rich  MD Electronically signed by Dina Rich MD Signature Date/Time: 12/11/2022/2:49:00 PM    Final    CT Chest Wo Contrast  Result Date: 12/10/2022 CLINICAL DATA:  Abnormal xray - lung nodule, >= 1 cm, fever EXAM: CT CHEST WITHOUT CONTRAST TECHNIQUE: Multidetector CT imaging of the chest was performed following the standard protocol without IV contrast. RADIATION DOSE REDUCTION: This exam was performed according to the departmental dose-optimization program which includes automated exposure control, adjustment of the mA and/or kV according to patient size and/or use of iterative reconstruction technique. COMPARISON:  None Available. FINDINGS: Cardiovascular: Moderate multi-vessel coronary artery calcification with prior left anterior descending coronary artery stenting. Global cardiac size within normal limits. No pericardial effusion. Central pulmonary arteries are of normal caliber. Moderate atherosclerotic calcification within the thoracic aorta. No aortic aneurysm. Particularly prominent atherosclerotic calcifications seen at the origin of the left subclavian artery, however, the degree of stenosis is not well assessed on this noncontrast examination. Mediastinum/Nodes: Visualized thyroid is unremarkable. Shotty right paratracheal and prevascular lymphadenopathy is present, nonspecific, possibly reactive in nature. The esophagus is unremarkable. Lungs/Pleura: There is masslike consolidation within the superior segment of the left lower lobe which neither obliterates nor demonstrates significant mass effect upon the traversing bronchi most in keeping with an inflammatory process such as round pneumonia. Mean 8 mm slightly spiculated pulmonary nodule noted within the left lower lobe, axial image # 86/4, indeterminate. No pneumothorax or pleural effusion. No central obstructing lesion. Upper Abdomen: No acute abnormality.  Status post cholecystectomy Musculoskeletal: Osseous structures are age-appropriate. No  acute bone abnormality. No lytic or blastic bone lesion IMPRESSION: 1. Masslike consolidation within the superior segment of the left lower lobe most in keeping with an inflammatory process such as round pneumonia in the acute setting. Follow-up chest radiograph is recommended, however, in 3-4 weeks to document complete resolution. 2. Mean 8 mm slightly spiculated pulmonary nodule within the left lower lobe, indeterminate. Non-contrast chest CT at 6-12 months is recommended. If the nodule is stable at time of repeat CT, then future CT at 18-24 months (from today's scan) is considered optional for low-risk patients, but is recommended for high-risk patients. This recommendation follows the consensus statement: Guidelines for Management of Incidental Pulmonary Nodules Detected on CT Images: From the Fleischner Society 2017; Radiology 2017; 284:228-243. 3. Moderate multi-vessel coronary artery calcification with prior left anterior descending coronary artery stenting. 4. Moderate atherosclerotic calcification within the thoracic aorta. Particularly prominent atherosclerotic calcifications seen at the origin of the left subclavian artery, however, the degree of stenosis is not well assessed on this noncontrast examination. If there is clinical evidence of subclavian steal, this could be better assessed with CT arteriography. Aortic Atherosclerosis (ICD10-I70.0). Electronically Signed   By: Helyn Numbers M.D.   On: 12/10/2022 19:46   DG Chest Port 1 View  Result Date: 12/10/2022 CLINICAL DATA:  Fever, questionable sepsis EXAM: PORTABLE CHEST 1 VIEW COMPARISON:  12/26/2010, 08/22/2022 FINDINGS: Single frontal view of the chest demonstrates a unremarkable cardiac silhouette. 6.1 cm rounded masslike area of consolidation projects in the left perihilar region. There is no abnormality  in this region on the prior CTA neck performed 08/22/2022. The remainder of the lungs are clear. No effusion or pneumothorax. No acute  bony abnormality. Stable postsurgical changes right shoulder. IMPRESSION: 1. 6.1 cm rounded masslike consolidation within the left hilar region. Differential diagnosis includes rounded pneumonia versus neoplasm. CT chest with IV contrast may be useful for further evaluation. Electronically Signed   By: Sharlet Salina M.D.   On: 12/10/2022 18:21   CT Head Wo Contrast  Result Date: 12/10/2022 CLINICAL DATA:  Severe headache.  Fever. EXAM: CT HEAD WITHOUT CONTRAST TECHNIQUE: Contiguous axial images were obtained from the base of the skull through the vertex without intravenous contrast. RADIATION DOSE REDUCTION: This exam was performed according to the departmental dose-optimization program which includes automated exposure control, adjustment of the mA and/or kV according to patient size and/or use of iterative reconstruction technique. COMPARISON:  08/22/2022 FINDINGS: Brain: No evidence of intracranial hemorrhage, acute infarction, hydrocephalus, extra-axial collection, or mass lesion/mass effect. Mild diffuse cerebral and cerebellar atrophy shows no significant change. Vascular:  No hyperdense vessel or other acute findings. Skull: No evidence of fracture or other significant bone abnormality. Sinuses/Orbits:  No acute findings. Other: None. IMPRESSION: No acute intracranial abnormality. Stable mild diffuse cerebral and cerebellar atrophy. Electronically Signed   By: Danae Orleans M.D.   On: 12/10/2022 18:18    Microbiology: Results for orders placed or performed during the hospital encounter of 12/10/22  Blood Culture (routine x 2)     Status: None (Preliminary result)   Collection Time: 12/10/22  6:00 PM   Specimen: BLOOD RIGHT ARM  Result Value Ref Range Status   Specimen Description BLOOD RIGHT ARM  Final   Special Requests   Final    BOTTLES DRAWN AEROBIC AND ANAEROBIC Blood Culture adequate volume   Culture   Final    NO GROWTH 3 DAYS Performed at [facility] [name], [place],  [place] [address]    Report Status PENDING  Incomplete  Blood Culture (routine x 2)     Status: None (Preliminary result)   Collection Time: 12/10/22  6:11 PM   Specimen: BLOOD RIGHT ARM  Result Value Ref Range Status   Specimen Description BLOOD RIGHT ARM  Final   Special Requests   Final    BOTTLES DRAWN AEROBIC AND ANAEROBIC Blood Culture results may not be optimal due to an inadequate volume of blood received in culture bottles   Culture   Final    NO GROWTH 3 DAYS Performed at [facility], [address], [place], [place] [address]    Report Status PENDING  Incomplete  Group A Strep by PCR     Status: None   Collection Time: 12/10/22  7:20 PM   Specimen: Throat; Sterile Swab  Result Value Ref Range Status   Group A Strep by PCR NOT DETECTED NOT DETECTED Final    Comment: Performed at [facility], [address] [place], [place] [address]  SARS Coronavirus 2 by RT PCR (hospital order, performed in [facility] hospital lab) *cepheid single result test* Anterior Nasal Swab     Status: None   Collection Time: 12/10/22  8:30 PM   Specimen: Anterior Nasal Swab  Result Value Ref Range Status   SARS Coronavirus 2 by RT PCR NEGATIVE NEGATIVE Final    Comment: (NOTE) SARS-CoV-2 target nucleic acids are NOT DETECTED.  The SARS-CoV-2 RNA is generally detectable in upper and lower respiratory specimens during the acute phase of infection. The lowest  concentration of SARS-CoV-2 viral copies this assay can detect is 250 copies / mL. A negative result does not preclude SARS-CoV-2 infection and should not be used as the sole basis for treatment or other patient management decisions.  A negative result may occur with improper specimen collection / handling, submission of specimen other than nasopharyngeal swab, presence of viral mutation(s) within the areas targeted by this assay, and inadequate number of viral copies (<250 copies / mL). A negative result must be combined with  clinical observations, patient history, and epidemiological information.  Fact Sheet for Patients:   RoadLapTop.co.za  Fact Sheet for Healthcare Providers: http://kim-miller.com/  This test is not yet approved or  cleared by the [place] FDA and has been authorized for detection and/or diagnosis of SARS-CoV-2 by FDA under an Emergency Use Authorization (EUA).  This EUA will remain in effect (meaning this test can be used) for the duration of the COVID-19 declaration under Section 564(b)(1) of the Act, 21 U.S.C. section 360bbb-3(b)(1), unless the authorization is terminated or revoked sooner.  Performed at [facility], [address] [place], [place] [address]   C Difficile Quick Screen w PCR reflex     Status: None   Collection Time: 12/12/22  1:35 AM   Specimen: Stool  Result Value Ref Range Status   C Diff antigen NEGATIVE NEGATIVE Final   C Diff toxin NEGATIVE NEGATIVE Final   C Diff interpretation No C. difficile detected.  Final    Comment: Performed at [facility], [address] [place], [place] [address]  Gastrointestinal Panel by PCR , Stool     Status: None   Collection Time: 12/12/22  1:35 AM   Specimen: Stool  Result Value Ref Range Status   Campylobacter species NOT DETECTED NOT DETECTED Final   Plesimonas shigelloides NOT DETECTED NOT DETECTED Final   Salmonella species NOT DETECTED NOT DETECTED Final   Yersinia enterocolitica NOT DETECTED NOT DETECTED Final   Vibrio species NOT DETECTED NOT DETECTED Final   Vibrio cholerae NOT DETECTED NOT DETECTED Final   Enteroaggregative E coli (EAEC) NOT DETECTED NOT DETECTED Final   Enteropathogenic E coli (EPEC) NOT DETECTED NOT DETECTED Final   Enterotoxigenic E coli (ETEC) NOT DETECTED NOT DETECTED Final   Shiga like toxin producing E coli (STEC) NOT DETECTED NOT DETECTED Final   Shigella/Enteroinvasive E coli (EIEC) NOT DETECTED NOT DETECTED Final   Cryptosporidium  NOT DETECTED NOT DETECTED Final   Cyclospora cayetanensis NOT DETECTED NOT DETECTED Final   Entamoeba histolytica NOT DETECTED NOT DETECTED Final   Giardia lamblia NOT DETECTED NOT DETECTED Final   Adenovirus F40/41 NOT DETECTED NOT DETECTED Final   Astrovirus NOT DETECTED NOT DETECTED Final   Norovirus GI/GII NOT DETECTED NOT DETECTED Final   Rotavirus A NOT DETECTED NOT DETECTED Final   Sapovirus (I, II, IV, and V) NOT DETECTED NOT DETECTED Final    Comment: Performed at [facility], [address] [place], [place] [address]    Labs: CBC: Recent Labs  Lab 12/10/22 1800 12/11/22 0957 12/12/22 0415 12/13/22 0410  WBC 24.1* 19.3* 19.1* 12.0*  NEUTROABS 19.8*  --   --   --   HGB 11.4* 10.4* 10.5* 10.3*  HCT 33.8* 32.1* 32.2* 31.7*  MCV 88.3 91.7 90.7 89.5  PLT 369 258 307 301   Basic Metabolic Panel: Recent Labs  Lab 12/10/22 1800 12/11/22 0957 12/12/22 0415 12/13/22 0410  NA 131* 135 134* 136  K 3.2* 3.6 3.7 3.3*  CL 98  104 105 105  CO2 23 23 22 22   GLUCOSE 153* 116* 120* 175*  BUN 13 9 7* 7*  CREATININE 0.81 0.68 0.65 0.51  CALCIUM 8.4* 8.1* 8.3* 8.5*  MG  --  1.8 1.8 1.9   Liver Function Tests: Recent Labs  Lab 12/10/22 1800  AST 28  ALT 19  ALKPHOS 52  BILITOT 1.1  PROT 6.4*  ALBUMIN 3.2*   CBG: No results for input(s): "GLUCAP" in the last 168 hours.  Discharge time spent: greater than 30 minutes.  Signed: Vassie Loll, MD [facility] 12/13/2022

## 2022-12-13 NOTE — Evaluation (Signed)
Physical Therapy Evaluation Patient Details Name: [name] MRN: [id number] DOB: Jan 18, 1951 Today's Date: 12/13/2022  History of Present Illness  [name] is an 72 y.o. female with a PMH of CAD status post stent, hyperlipidemia, hypertension, fibromyalgia, prediabetes, and migraines who presented to the ED with a chief complaint of generalized weakness associated with fever.  [name] is accompanied by her granddaughter who provides much of the history.  According to [name]'s granddaughter, [name] has been complaining of her sinuses bothering her for about 1 week but did not want to see her PCP.  She apparently had a syncopal episode and fell and was found down.  [name] does not remember falling down.  According to her granddaughter, she was confused and disoriented and did not recognize her granddaughter and was talking about her son, who passed away approximately 2 weeks ago, as if he were present.  She had not had anything to eat prior to this happening.  Her granddaughter reports that she was hospitalized for a TIA back in January, and that she takes Zoloft and Xanax for depression.  The patient reports that she has had a nonproductive cough but no shortness of breath or chest pain.  She has felt weak.  Denies any weight loss and reports that her appetite has been okay.  She did not bring her medications with her but notes that she is on Plavix.  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  She is interested in obtaining paperwork for living will/POA as she does not currently have these documents.  She denies active smoking but she does vape.   Clinical Impression  Patient functioning near baseline for functional mobility and gait; demonstrating good return for bed mobility, transfers and ambulating in room/hallway without loss of balance.  Patient encouraged to ambulate with nursing staff supervising as tolerated for length of stay.  Plan:  Patient  discharged from physical therapy to care of nursing for ambulation daily as tolerated for length of stay.         Recommendations for follow up therapy are one component of a multi-disciplinary discharge planning process, led by the attending physician.  Recommendations may be updated based on patient status, additional functional criteria and insurance authorization.  Follow Up Recommendations       Assistance Recommended at Discharge PRN  Patient can return home with the following  Other (comment) (near baseline)    Equipment Recommendations    Recommendations for Other Services       Functional Status Assessment Patient has had a recent decline in their functional status and/or demonstrates limited ability to make significant improvements in function in a reasonable and predictable amount of time     Precautions / Restrictions Precautions Precautions: Fall Restrictions Weight Bearing Restrictions: No      Mobility  Bed Mobility Overal bed mobility: Independent                  Transfers Overall transfer level: Modified independent                      Ambulation/Gait Ambulation/Gait assistance: Modified independent (Device/Increase time) Gait Distance (Feet): 80 Feet Assistive device: None Gait Pattern/deviations: WFL(Within Functional Limits) Gait velocity: decreased     General Gait Details: grossly WFL with slightly labored cadence, no loss of balance and limited mostly due to fatigue  Stairs  Wheelchair Mobility    Modified Rankin (Stroke Patients Only)       Balance Overall balance assessment: No apparent balance deficits (not formally assessed)                                           Pertinent Vitals/Pain Pain Assessment Pain Assessment: No/denies pain    Home Living Family/patient expects to be discharged to:: Private residence Living Arrangements: Parent Available Help at Discharge:  Family;Available PRN/intermittently Type of Home: House Home Access: Stairs to enter Entrance Stairs-Rails: Right;Left Entrance Stairs-Number of Steps: 3   Home Layout: Two level;Able to live on main level with bedroom/bathroom;Full bath on main level Home Equipment: Rolling Walker (2 wheels);Cane - single point;BSC/3in1;Shower seat      Prior Function Prior Level of Function : Independent/Modified Independent             Mobility Comments: Patient states community ambulation without AD ADLs Comments: independent     Hand Dominance   Dominant Hand: Right    Extremity/Trunk Assessment   Upper Extremity Assessment Upper Extremity Assessment: Overall WFL for tasks assessed    Lower Extremity Assessment Lower Extremity Assessment: Overall WFL for tasks assessed    Cervical / Trunk Assessment Cervical / Trunk Assessment: Normal  Communication   Communication: No difficulties  Cognition Arousal/Alertness: Awake/alert Behavior During Therapy: WFL for tasks assessed/performed Overall Cognitive Status: Within Functional Limits for tasks assessed                                          General Comments      Exercises     Assessment/Plan    PT Assessment Patient does not need any further PT services  PT Problem List         PT Treatment Interventions      PT Goals (Current goals can be found in the Care Plan section)  Acute Rehab PT Goals Patient Stated Goal: return home with family to assist PT Goal Formulation: With patient Time For Goal Achievement: 12/13/22 Potential to Achieve Goals: Good    Frequency       Co-evaluation               AM-PAC PT "6 Clicks" Mobility  Outcome Measure Help needed turning from your back to your side while in a flat bed without using bedrails?: None Help needed moving from lying on your back to sitting on the side of a flat bed without using bedrails?: None Help needed moving to and from a bed  to a chair (including a wheelchair)?: None Help needed standing up from a chair using your arms (e.g., wheelchair or bedside chair)?: None Help needed to walk in hospital room?: None Help needed climbing 3-5 steps with a railing? : A Little 6 Click Score: 23    End of Session   Activity Tolerance: Patient tolerated treatment well;Patient limited by fatigue Patient left: in chair;with call bell/phone within reach Nurse Communication: Mobility status PT Visit Diagnosis: Unsteadiness on feet (R26.81);Other abnormalities of gait and mobility (R26.89);Muscle weakness (generalized) (M62.81)    Time: 3086-5784 PT Time Calculation (min) (ACUTE ONLY): 16 min   Charges:   PT Evaluation $PT Eval Low Complexity: 1 Low PT Treatments $Therapeutic Activity: 8-22 mins  2:15 PM, 12/13/22 Ocie Bob, MPT Physical Therapist with [facility] [phone] office [phone] mobile phone

## 2022-12-15 LAB — CULTURE, BLOOD (ROUTINE X 2)
Culture: NO GROWTH
Culture: NO GROWTH
Special Requests: ADEQUATE

## 2022-12-22 DIAGNOSIS — D72829 Elevated white blood cell count, unspecified: Secondary | ICD-10-CM | POA: Diagnosis not present

## 2022-12-22 DIAGNOSIS — R5383 Other fatigue: Secondary | ICD-10-CM | POA: Diagnosis not present

## 2022-12-22 DIAGNOSIS — R918 Other nonspecific abnormal finding of lung field: Secondary | ICD-10-CM | POA: Diagnosis not present

## 2022-12-22 DIAGNOSIS — E876 Hypokalemia: Secondary | ICD-10-CM | POA: Diagnosis not present

## 2022-12-22 DIAGNOSIS — J189 Pneumonia, unspecified organism: Secondary | ICD-10-CM | POA: Diagnosis not present

## 2022-12-22 DIAGNOSIS — Z6832 Body mass index (BMI) 32.0-32.9, adult: Secondary | ICD-10-CM | POA: Diagnosis not present

## 2023-01-09 ENCOUNTER — Other Ambulatory Visit: Payer: Self-pay

## 2023-01-09 ENCOUNTER — Encounter (HOSPITAL_COMMUNITY): Payer: Self-pay | Admitting: Emergency Medicine

## 2023-01-09 ENCOUNTER — Emergency Department (HOSPITAL_COMMUNITY): Payer: Medicare HMO

## 2023-01-09 ENCOUNTER — Emergency Department (HOSPITAL_COMMUNITY)
Admission: EM | Admit: 2023-01-09 | Discharge: 2023-01-09 | Disposition: A | Discharge status: Home / Self Care | Payer: Medicare HMO | Attending: Emergency Medicine | Admitting: Emergency Medicine

## 2023-01-09 DIAGNOSIS — Z87891 Personal history of nicotine dependence: Secondary | ICD-10-CM | POA: Diagnosis not present

## 2023-01-09 DIAGNOSIS — R9431 Abnormal electrocardiogram [ECG] [EKG]: Secondary | ICD-10-CM | POA: Diagnosis not present

## 2023-01-09 DIAGNOSIS — Z7902 Long term (current) use of antithrombotics/antiplatelets: Secondary | ICD-10-CM | POA: Diagnosis not present

## 2023-01-09 DIAGNOSIS — Z79899 Other long term (current) drug therapy: Secondary | ICD-10-CM | POA: Insufficient documentation

## 2023-01-09 DIAGNOSIS — S40012A Contusion of left shoulder, initial encounter: Secondary | ICD-10-CM | POA: Diagnosis not present

## 2023-01-09 DIAGNOSIS — Y92000 Kitchen of unspecified non-institutional (private) residence as  the place of occurrence of the external cause: Secondary | ICD-10-CM | POA: Diagnosis not present

## 2023-01-09 DIAGNOSIS — W1830XA Fall on same level, unspecified, initial encounter: Secondary | ICD-10-CM | POA: Insufficient documentation

## 2023-01-09 DIAGNOSIS — M25512 Pain in left shoulder: Secondary | ICD-10-CM | POA: Diagnosis not present

## 2023-01-09 DIAGNOSIS — Z6833 Body mass index (BMI) 33.0-33.9, adult: Secondary | ICD-10-CM | POA: Diagnosis not present

## 2023-01-09 DIAGNOSIS — Z8673 Personal history of transient ischemic attack (TIA), and cerebral infarction without residual deficits: Secondary | ICD-10-CM | POA: Insufficient documentation

## 2023-01-09 DIAGNOSIS — I2510 Atherosclerotic heart disease of native coronary artery without angina pectoris: Secondary | ICD-10-CM | POA: Insufficient documentation

## 2023-01-09 DIAGNOSIS — W19XXXA Unspecified fall, initial encounter: Secondary | ICD-10-CM

## 2023-01-09 DIAGNOSIS — R55 Syncope and collapse: Secondary | ICD-10-CM | POA: Diagnosis not present

## 2023-01-09 DIAGNOSIS — S8001XA Contusion of right knee, initial encounter: Secondary | ICD-10-CM | POA: Diagnosis not present

## 2023-01-09 DIAGNOSIS — S4992XA Unspecified injury of left shoulder and upper arm, initial encounter: Secondary | ICD-10-CM

## 2023-01-09 DIAGNOSIS — E119 Type 2 diabetes mellitus without complications: Secondary | ICD-10-CM | POA: Diagnosis not present

## 2023-01-09 DIAGNOSIS — M25522 Pain in left elbow: Secondary | ICD-10-CM | POA: Diagnosis not present

## 2023-01-09 DIAGNOSIS — I10 Essential (primary) hypertension: Secondary | ICD-10-CM | POA: Diagnosis not present

## 2023-01-09 DIAGNOSIS — Z96643 Presence of artificial hip joint, bilateral: Secondary | ICD-10-CM | POA: Diagnosis not present

## 2023-01-09 LAB — CBC
HCT: 38.6 % (ref 36.0–46.0)
Hemoglobin: 12.5 g/dL (ref 12.0–15.0)
MCH: 29.2 pg (ref 26.0–34.0)
MCHC: 32.4 g/dL (ref 30.0–36.0)
MCV: 90.2 fL (ref 80.0–100.0)
Platelets: 397 10*3/uL (ref 150–400)
RBC: 4.28 MIL/uL (ref 3.87–5.11)
RDW: 13 % (ref 11.5–15.5)
WBC: 8 10*3/uL (ref 4.0–10.5)
nRBC: 0 % (ref 0.0–0.2)

## 2023-01-09 LAB — PROTIME-INR
INR: 1 (ref 0.8–1.2)
Prothrombin Time: 13.3 seconds (ref 11.4–15.2)

## 2023-01-09 LAB — COMPREHENSIVE METABOLIC PANEL
ALT: 27 U/L (ref 0–44)
AST: 32 U/L (ref 15–41)
Albumin: 3.7 g/dL (ref 3.5–5.0)
Alkaline Phosphatase: 68 U/L (ref 38–126)
Anion gap: 8 (ref 5–15)
BUN: 15 mg/dL (ref 8–23)
CO2: 29 mmol/L (ref 22–32)
Calcium: 8.9 mg/dL (ref 8.9–10.3)
Chloride: 99 mmol/L (ref 98–111)
Creatinine, Ser: 0.97 mg/dL (ref 0.44–1.00)
GFR, Estimated: >60 mL/min (ref >60)
Glucose, Bld: 100 mg/dL — ABNORMAL HIGH (ref 70–99)
Potassium: 3.4 mmol/L — ABNORMAL LOW (ref 3.5–5.1)
Sodium: 136 mmol/L (ref 135–145)
Total Bilirubin: 0.6 mg/dL (ref 0.3–1.2)
Total Protein: 6.9 g/dL (ref 6.5–8.1)

## 2023-01-09 LAB — TROPONIN I (HIGH SENSITIVITY)
Troponin I (High Sensitivity): 5 ng/L (ref <18)
Troponin I (High Sensitivity): 5 ng/L (ref <18)

## 2023-01-09 LAB — TYPE AND SCREEN
ABO/RH(D): A POS
Antibody Screen: NEGATIVE

## 2023-01-09 NOTE — ED Provider Notes (Signed)
[place] EMERGENCY DEPARTMENT AT [facility] Provider Note   CSN: [id number] Arrival date & time: 01/09/23  1721     History  Chief Complaint  Patient presents with   Fall   possible syncope    [name] is a 72 y.o. female.  Patient status post a fall in her kitchen on Sunday.  Patient went down more on her left shoulder.  Patient with a lot of pain left shoulder worried about a possible rotator cuff injury she has had that repaired in the past on the right side.  Patient also has had bilateral hip surgery.  Patient did not hit her head hard.  No complaint of headache.  No neck pain.  Patient already has a sling for the shoulder patient already is on tramadol and extra strength Tylenol.  Patient has been seen by Dr. Valentina Shaggy in the past.  Patient has been ambulating at home without difficulty.  Patient remembers falling.  Did not pass out did not have a syncopal episode did not have loss of consciousness.  Past medical history significant coronary artery disease significant for anxiety significant for fibromyalgia significant for hypertension prediabetes.  Patient's had a right total knee replacements had an abdominal hysterectomy has had a total hip arthroplasty on the left and right in 2018.  Patient is a former smoker quit in 2018       Home Medications Prior to Admission medications   Medication Sig Start Date End Date Taking? Authorizing Provider  acetaminophen (TYLENOL) 500 MG tablet Take 1,000 mg by mouth every 6 (six) hours as needed for mild pain or moderate pain.    [provider]  ALPRAZolam Prudy Feeler) 0.5 MG tablet Take 0.5 mg by mouth 3 (three) times daily as needed for anxiety.    [provider]  amLODipine (NORVASC) 10 MG tablet Take 10 mg by mouth daily.    [provider]  atorvastatin (LIPITOR) 20 MG tablet Take 1 tablet (20 mg total) by mouth daily. Patient taking differently: Take 10 mg by mouth daily. 08/22/22 08/22/23   Shon Hale, MD  cholecalciferol (VITAMIN D) 1000 units tablet Take 1,000 Units by mouth daily.    [provider]  clobetasol (TEMOVATE) 0.05 % external solution Apply 1 application  topically 2 (two) times daily as needed (rash/irritation). scalp    [provider]  clopidogrel (PLAVIX) 75 MG tablet Take 1 tablet (75 mg total) by mouth daily. - take Aspirin 81 mg daily along with Plavix 75 mg daily for 21 days then after that STOP the Aspirin and continue ONLY Plavix 75 mg daily indefinitely--for secondary stroke Prevention Patient taking differently: Take 75 mg by mouth daily. 08/22/22 08/22/23  Shon Hale, MD  cyclobenzaprine (FLEXERIL) 10 MG tablet Take 10 mg by mouth 3 (three) times daily as needed for muscle spasms.    [provider]  diphenhydrAMINE (BENADRYL) 25 mg capsule Take 25 mg by mouth every 6 (six) hours as needed for itching or allergies.    [provider]  furosemide (LASIX) 40 MG tablet Take 0.5 tablets (20 mg total) by mouth daily. 12/13/22   Vassie Loll, MD  gabapentin (NEURONTIN) 400 MG capsule Take 800 mg by mouth 2 (two) times daily.    [provider]  Multiple Vitamins-Minerals (MULTIVITAMIN WITH MINERALS) tablet Take 1 tablet by mouth daily. Women's One-A-Day    [provider]  nitroGLYCERIN (NITROSTAT) 0.4 MG SL tablet Place 1 tablet (0.4 mg total) under the  tongue every 5 (five) minutes x 3 doses as needed for chest pain (if no relief after 3rd dose, proceed to the ED or call 911). 10/22/18   Jonelle Sidle, MD  pantoprazole (PROTONIX) 40 MG tablet Take 1 tablet (40 mg total) by mouth daily. 12/13/22 01/12/23  Vassie Loll, MD  predniSONE (DELTASONE) 10 MG tablet Take 4 tablets by mouth daily x 1 day; then 3 tablets by mouth daily x 1 day; then 2 tablets by mouth daily x 2 days; then 1 tablet by mouth daily x 3 days and stop Prednisone. 12/13/22   Vassie Loll, MD  sertraline (ZOLOFT) 25 MG tablet Take  50 mg by mouth daily. 03/13/22   [provider]  traMADol (ULTRAM) 50 MG tablet One tablet every six hours as needed for pain. Patient taking differently: Take 50 mg by mouth every 6 (six) hours as needed for moderate pain. One tablet every six hours as needed for pain. 10/28/19   Darreld Mclean, MD  triamcinolone cream (KENALOG) 0.1 % Apply 1 Application topically daily as needed (skin irritation). 12/13/22   Vassie Loll, MD      Allergies    Iodine, Iohexol, Ambien [zolpidem], Adhesive [tape], Niacin, and Penicillins    Review of Systems   Review of Systems  Constitutional:  Negative for chills and fever.  HENT:  Negative for ear pain and sore throat.   Eyes:  Negative for pain and visual disturbance.  Respiratory:  Negative for cough and shortness of breath.   Cardiovascular:  Negative for chest pain and palpitations.  Gastrointestinal:  Negative for abdominal pain and vomiting.  Genitourinary:  Negative for dysuria and hematuria.  Musculoskeletal:  Negative for arthralgias, back pain and neck pain.  Skin:  Negative for color change and rash.  Neurological:  Negative for seizures, syncope and headaches.  All other systems reviewed and are negative.   Physical Exam Updated Vital Signs BP 107/67   Pulse 62   Temp (!) 97.4 F (36.3 C) (Oral)   Resp 16   Ht 1.575 m (5\' 2" )   Wt 82.1 kg   SpO2 99%   BMI 33.11 kg/m  Physical Exam Vitals and nursing note reviewed.  Constitutional:      General: She is not in acute distress.    Appearance: Normal appearance. She is well-developed.  HENT:     Head: Normocephalic and atraumatic.  Eyes:     Extraocular Movements: Extraocular movements intact.     Conjunctiva/sclera: Conjunctivae normal.     Pupils: Pupils are equal, round, and reactive to light.  Cardiovascular:     Rate and Rhythm: Normal rate and regular rhythm.     Heart sounds: No murmur heard. Pulmonary:     Effort: Pulmonary effort is normal. No respiratory  distress.     Breath sounds: Normal breath sounds.  Abdominal:     Palpations: Abdomen is soft.     Tenderness: There is no abdominal tenderness.  Musculoskeletal:        General: Tenderness present. No swelling.     Cervical back: Neck supple.     Comments: Tenderness to palpation to the top and lateral aspect of the right shoulder with a little bit of bruising.  No obvious deformity.  Good range of motion at the fingers and wrist.  And elbow.  Radial pulses 2+.  Sensations intact distally.  No evidence of any significant injury to lower extremities.  Some bruising to the knee.  Skin:  General: Skin is warm and dry.     Capillary Refill: Capillary refill takes less than 2 seconds.  Neurological:     General: No focal deficit present.     Mental Status: She is alert and oriented to person, place, and time.     Cranial Nerves: No cranial nerve deficit.     Sensory: No sensory deficit.     Motor: No weakness.  Psychiatric:        Mood and Affect: Mood normal.     ED Results / Procedures / Treatments   Labs (all labs ordered are listed, but only abnormal results are displayed) Labs Reviewed  COMPREHENSIVE METABOLIC PANEL - Abnormal; Notable for the following components:      Result Value   Potassium 3.4 (*)    Glucose, Bld 100 (*)    All other components within normal limits  PROTIME-INR  CBC  TYPE AND SCREEN  TROPONIN I (HIGH SENSITIVITY)  TROPONIN I (HIGH SENSITIVITY)    EKG None  Radiology DG Elbow Complete Left  Result Date: 01/09/2023 CLINICAL DATA:  Fall, left elbow pain. EXAM: LEFT ELBOW - COMPLETE 3+ VIEW COMPARISON:  None Available. FINDINGS: There is no evidence of fracture, dislocation, or joint effusion. There is no evidence of arthropathy or other focal bone abnormality. Soft tissues are unremarkable. IMPRESSION: Negative. Electronically Signed   By: Larose Hires D.O.   On: 01/09/2023 19:12   DG Shoulder Left  Result Date: 01/09/2023 CLINICAL DATA:  Fall  left shoulder pain. EXAM: LEFT SHOULDER - 2+ VIEW COMPARISON:  None Available. FINDINGS: There is no evidence of fracture or dislocation. Mild arthropathy of the glenohumeral and acromioclavicular joints. Soft tissues are unremarkable. IMPRESSION: No acute fracture or dislocation. Electronically Signed   By: Larose Hires D.O.   On: 01/09/2023 19:11    Procedures Procedures    Medications Ordered in ED Medications - No data to display  ED Course/ Medical Decision Making/ A&P                             Medical Decision Making Amount and/or Complexity of Data Reviewed Labs: ordered. Radiology: ordered.   Patient had some labs probably relating to the fall that occurred on Sunday.  Troponins x 2 have been normal patient denied any chest pain CBC was normal complete metabolic panel significant for potassium 3.4 renal function normal LFTs are normal.  X-ray of the left shoulder was negative x-ray of the left elbow was negative.  Patient will wear her sling we will have her follow-up with Dr. Romeo Apple who is her orthopedic surgeon.   Final Clinical Impression(s) / ED Diagnoses Final diagnoses:  Fall, initial encounter  Injury of left shoulder, initial encounter    Rx / DC Orders ED Discharge Orders     None         Vanetta Mulders, MD 01/09/23 2214

## 2023-01-09 NOTE — Discharge Instructions (Signed)
X-rays negative for any bony injuries to the left shoulder or left elbow.  As we discussed it is always possible there could be some soft tissue injury to the left shoulder or rotator cuff.  Wear your sling as directed.  Take your tramadol and extra strength Tylenol as directed.  Make an appointment to follow-up with your orthopedist Dr. Romeo Apple.

## 2023-01-09 NOTE — ED Triage Notes (Signed)
Pt sent from PCP Dr. Charna Elizabeth after possible syncopal episode on Sunday 6/16. Pt fell on left shoulder and is unsure if she hit her head. She takes plavix daily. Hx TIA, no current neuro complaints. Pt is a/o in triage and ambulatory without assistance. Left arm is in a sling with poor ROM per Dr. Mayford Knife who called ahead to inform triage RN of pt presentation and recommendation for CT head.

## 2023-01-12 DIAGNOSIS — E876 Hypokalemia: Secondary | ICD-10-CM | POA: Diagnosis not present

## 2023-01-12 DIAGNOSIS — R918 Other nonspecific abnormal finding of lung field: Secondary | ICD-10-CM | POA: Diagnosis not present

## 2023-01-12 DIAGNOSIS — R5383 Other fatigue: Secondary | ICD-10-CM | POA: Diagnosis not present

## 2023-01-12 DIAGNOSIS — J189 Pneumonia, unspecified organism: Secondary | ICD-10-CM | POA: Diagnosis not present

## 2023-01-12 DIAGNOSIS — D72829 Elevated white blood cell count, unspecified: Secondary | ICD-10-CM | POA: Diagnosis not present

## 2023-01-16 DIAGNOSIS — M25512 Pain in left shoulder: Secondary | ICD-10-CM | POA: Diagnosis not present

## 2023-01-16 DIAGNOSIS — I10 Essential (primary) hypertension: Secondary | ICD-10-CM | POA: Diagnosis not present

## 2023-01-16 DIAGNOSIS — R7303 Prediabetes: Secondary | ICD-10-CM | POA: Diagnosis not present

## 2023-01-16 DIAGNOSIS — E559 Vitamin D deficiency, unspecified: Secondary | ICD-10-CM | POA: Diagnosis not present

## 2023-01-23 DIAGNOSIS — I10 Essential (primary) hypertension: Secondary | ICD-10-CM | POA: Diagnosis not present

## 2023-01-23 DIAGNOSIS — F419 Anxiety disorder, unspecified: Secondary | ICD-10-CM | POA: Diagnosis not present

## 2023-01-23 DIAGNOSIS — F332 Major depressive disorder, recurrent severe without psychotic features: Secondary | ICD-10-CM | POA: Diagnosis not present

## 2023-01-23 DIAGNOSIS — Z8673 Personal history of transient ischemic attack (TIA), and cerebral infarction without residual deficits: Secondary | ICD-10-CM | POA: Diagnosis not present

## 2023-01-23 DIAGNOSIS — E559 Vitamin D deficiency, unspecified: Secondary | ICD-10-CM | POA: Diagnosis not present

## 2023-01-23 DIAGNOSIS — Z6832 Body mass index (BMI) 32.0-32.9, adult: Secondary | ICD-10-CM | POA: Diagnosis not present

## 2023-01-23 DIAGNOSIS — R7301 Impaired fasting glucose: Secondary | ICD-10-CM | POA: Diagnosis not present

## 2023-01-23 DIAGNOSIS — M797 Fibromyalgia: Secondary | ICD-10-CM | POA: Diagnosis not present

## 2023-01-23 DIAGNOSIS — J189 Pneumonia, unspecified organism: Secondary | ICD-10-CM | POA: Diagnosis not present

## 2023-01-24 DIAGNOSIS — L281 Prurigo nodularis: Secondary | ICD-10-CM | POA: Diagnosis not present

## 2023-01-24 DIAGNOSIS — L570 Actinic keratosis: Secondary | ICD-10-CM | POA: Diagnosis not present

## 2023-01-31 DIAGNOSIS — M75122 Complete rotator cuff tear or rupture of left shoulder, not specified as traumatic: Secondary | ICD-10-CM | POA: Diagnosis not present

## 2023-01-31 DIAGNOSIS — R936 Abnormal findings on diagnostic imaging of limbs: Secondary | ICD-10-CM | POA: Diagnosis not present

## 2023-01-31 DIAGNOSIS — M19012 Primary osteoarthritis, left shoulder: Secondary | ICD-10-CM | POA: Diagnosis not present

## 2023-01-31 DIAGNOSIS — M75102 Unspecified rotator cuff tear or rupture of left shoulder, not specified as traumatic: Secondary | ICD-10-CM | POA: Diagnosis not present

## 2023-01-31 DIAGNOSIS — M25412 Effusion, left shoulder: Secondary | ICD-10-CM | POA: Diagnosis not present

## 2023-01-31 DIAGNOSIS — S46812A Strain of other muscles, fascia and tendons at shoulder and upper arm level, left arm, initial encounter: Secondary | ICD-10-CM | POA: Diagnosis not present

## 2023-02-05 DIAGNOSIS — M25512 Pain in left shoulder: Secondary | ICD-10-CM | POA: Diagnosis not present

## 2023-02-05 DIAGNOSIS — M2559 Pain in other specified joint: Secondary | ICD-10-CM | POA: Diagnosis not present

## 2023-02-09 DIAGNOSIS — M2559 Pain in other specified joint: Secondary | ICD-10-CM | POA: Diagnosis not present

## 2023-02-09 DIAGNOSIS — Z01818 Encounter for other preprocedural examination: Secondary | ICD-10-CM | POA: Diagnosis not present

## 2023-02-14 DIAGNOSIS — M797 Fibromyalgia: Secondary | ICD-10-CM | POA: Diagnosis not present

## 2023-02-14 DIAGNOSIS — M75100 Unspecified rotator cuff tear or rupture of unspecified shoulder, not specified as traumatic: Secondary | ICD-10-CM | POA: Diagnosis not present

## 2023-02-14 DIAGNOSIS — R7303 Prediabetes: Secondary | ICD-10-CM | POA: Diagnosis not present

## 2023-02-14 DIAGNOSIS — Z8673 Personal history of transient ischemic attack (TIA), and cerebral infarction without residual deficits: Secondary | ICD-10-CM | POA: Diagnosis not present

## 2023-02-14 DIAGNOSIS — Z6833 Body mass index (BMI) 33.0-33.9, adult: Secondary | ICD-10-CM | POA: Diagnosis not present

## 2023-02-14 DIAGNOSIS — R030 Elevated blood-pressure reading, without diagnosis of hypertension: Secondary | ICD-10-CM | POA: Diagnosis not present

## 2023-02-22 DIAGNOSIS — M25512 Pain in left shoulder: Secondary | ICD-10-CM | POA: Diagnosis not present

## 2023-02-22 DIAGNOSIS — S46012D Strain of muscle(s) and tendon(s) of the rotator cuff of left shoulder, subsequent encounter: Secondary | ICD-10-CM | POA: Diagnosis not present

## 2023-02-27 DIAGNOSIS — S46012D Strain of muscle(s) and tendon(s) of the rotator cuff of left shoulder, subsequent encounter: Secondary | ICD-10-CM | POA: Diagnosis not present

## 2023-02-27 DIAGNOSIS — Z01818 Encounter for other preprocedural examination: Secondary | ICD-10-CM | POA: Diagnosis not present

## 2023-02-28 DIAGNOSIS — I2510 Atherosclerotic heart disease of native coronary artery without angina pectoris: Secondary | ICD-10-CM | POA: Diagnosis not present

## 2023-02-28 DIAGNOSIS — M24112 Other articular cartilage disorders, left shoulder: Secondary | ICD-10-CM | POA: Diagnosis not present

## 2023-02-28 DIAGNOSIS — M797 Fibromyalgia: Secondary | ICD-10-CM | POA: Diagnosis not present

## 2023-02-28 DIAGNOSIS — M19012 Primary osteoarthritis, left shoulder: Secondary | ICD-10-CM | POA: Diagnosis not present

## 2023-02-28 DIAGNOSIS — I10 Essential (primary) hypertension: Secondary | ICD-10-CM | POA: Diagnosis not present

## 2023-02-28 DIAGNOSIS — Z6833 Body mass index (BMI) 33.0-33.9, adult: Secondary | ICD-10-CM | POA: Diagnosis not present

## 2023-02-28 DIAGNOSIS — M75122 Complete rotator cuff tear or rupture of left shoulder, not specified as traumatic: Secondary | ICD-10-CM | POA: Diagnosis not present

## 2023-02-28 DIAGNOSIS — M94212 Chondromalacia, left shoulder: Secondary | ICD-10-CM | POA: Diagnosis not present

## 2023-02-28 DIAGNOSIS — M24812 Other specific joint derangements of left shoulder, not elsewhere classified: Secondary | ICD-10-CM | POA: Diagnosis not present

## 2023-02-28 DIAGNOSIS — M75102 Unspecified rotator cuff tear or rupture of left shoulder, not specified as traumatic: Secondary | ICD-10-CM | POA: Diagnosis not present

## 2023-02-28 DIAGNOSIS — S46292A Other injury of muscle, fascia and tendon of other parts of biceps, left arm, initial encounter: Secondary | ICD-10-CM | POA: Diagnosis not present

## 2023-03-02 DIAGNOSIS — I2510 Atherosclerotic heart disease of native coronary artery without angina pectoris: Secondary | ICD-10-CM | POA: Diagnosis not present

## 2023-03-27 DIAGNOSIS — L299 Pruritus, unspecified: Secondary | ICD-10-CM | POA: Diagnosis not present

## 2023-03-27 DIAGNOSIS — L570 Actinic keratosis: Secondary | ICD-10-CM | POA: Diagnosis not present

## 2023-03-27 DIAGNOSIS — L281 Prurigo nodularis: Secondary | ICD-10-CM | POA: Diagnosis not present

## 2023-04-18 DIAGNOSIS — E7801 Familial hypercholesterolemia: Secondary | ICD-10-CM | POA: Diagnosis not present

## 2023-04-18 DIAGNOSIS — R7303 Prediabetes: Secondary | ICD-10-CM | POA: Diagnosis not present

## 2023-04-18 DIAGNOSIS — L0100 Impetigo, unspecified: Secondary | ICD-10-CM | POA: Diagnosis not present

## 2023-04-18 DIAGNOSIS — E559 Vitamin D deficiency, unspecified: Secondary | ICD-10-CM | POA: Diagnosis not present

## 2023-04-18 DIAGNOSIS — N183 Chronic kidney disease, stage 3 unspecified: Secondary | ICD-10-CM | POA: Diagnosis not present

## 2023-04-18 DIAGNOSIS — L281 Prurigo nodularis: Secondary | ICD-10-CM | POA: Diagnosis not present

## 2023-04-18 DIAGNOSIS — D72829 Elevated white blood cell count, unspecified: Secondary | ICD-10-CM | POA: Diagnosis not present

## 2023-04-18 DIAGNOSIS — R7301 Impaired fasting glucose: Secondary | ICD-10-CM | POA: Diagnosis not present

## 2023-04-18 DIAGNOSIS — R5383 Other fatigue: Secondary | ICD-10-CM | POA: Diagnosis not present

## 2023-04-18 DIAGNOSIS — E876 Hypokalemia: Secondary | ICD-10-CM | POA: Diagnosis not present

## 2023-04-26 DIAGNOSIS — Z23 Encounter for immunization: Secondary | ICD-10-CM | POA: Diagnosis not present

## 2023-04-26 DIAGNOSIS — Z8673 Personal history of transient ischemic attack (TIA), and cerebral infarction without residual deficits: Secondary | ICD-10-CM | POA: Diagnosis not present

## 2023-04-26 DIAGNOSIS — F332 Major depressive disorder, recurrent severe without psychotic features: Secondary | ICD-10-CM | POA: Diagnosis not present

## 2023-04-26 DIAGNOSIS — E559 Vitamin D deficiency, unspecified: Secondary | ICD-10-CM | POA: Diagnosis not present

## 2023-04-26 DIAGNOSIS — R7301 Impaired fasting glucose: Secondary | ICD-10-CM | POA: Diagnosis not present

## 2023-04-26 DIAGNOSIS — I10 Essential (primary) hypertension: Secondary | ICD-10-CM | POA: Diagnosis not present

## 2023-04-26 DIAGNOSIS — M797 Fibromyalgia: Secondary | ICD-10-CM | POA: Diagnosis not present

## 2023-04-26 DIAGNOSIS — Z0001 Encounter for general adult medical examination with abnormal findings: Secondary | ICD-10-CM | POA: Diagnosis not present

## 2023-04-26 DIAGNOSIS — F419 Anxiety disorder, unspecified: Secondary | ICD-10-CM | POA: Diagnosis not present

## 2023-04-26 DIAGNOSIS — Z6833 Body mass index (BMI) 33.0-33.9, adult: Secondary | ICD-10-CM | POA: Diagnosis not present

## 2023-05-01 DIAGNOSIS — M25512 Pain in left shoulder: Secondary | ICD-10-CM | POA: Diagnosis not present

## 2023-05-01 DIAGNOSIS — R29898 Other symptoms and signs involving the musculoskeletal system: Secondary | ICD-10-CM | POA: Diagnosis not present

## 2023-05-01 DIAGNOSIS — Z9889 Other specified postprocedural states: Secondary | ICD-10-CM | POA: Diagnosis not present

## 2023-05-01 DIAGNOSIS — M25612 Stiffness of left shoulder, not elsewhere classified: Secondary | ICD-10-CM | POA: Diagnosis not present

## 2023-05-01 DIAGNOSIS — S46012D Strain of muscle(s) and tendon(s) of the rotator cuff of left shoulder, subsequent encounter: Secondary | ICD-10-CM | POA: Diagnosis not present

## 2023-05-02 DIAGNOSIS — L0100 Impetigo, unspecified: Secondary | ICD-10-CM | POA: Diagnosis not present

## 2023-05-02 DIAGNOSIS — L570 Actinic keratosis: Secondary | ICD-10-CM | POA: Diagnosis not present

## 2023-05-02 DIAGNOSIS — L280 Lichen simplex chronicus: Secondary | ICD-10-CM | POA: Diagnosis not present

## 2023-05-15 DIAGNOSIS — M25512 Pain in left shoulder: Secondary | ICD-10-CM | POA: Diagnosis not present

## 2023-05-15 DIAGNOSIS — S46012D Strain of muscle(s) and tendon(s) of the rotator cuff of left shoulder, subsequent encounter: Secondary | ICD-10-CM | POA: Diagnosis not present

## 2023-05-15 DIAGNOSIS — R29898 Other symptoms and signs involving the musculoskeletal system: Secondary | ICD-10-CM | POA: Diagnosis not present

## 2023-05-15 DIAGNOSIS — M25612 Stiffness of left shoulder, not elsewhere classified: Secondary | ICD-10-CM | POA: Diagnosis not present

## 2023-05-15 DIAGNOSIS — Z9889 Other specified postprocedural states: Secondary | ICD-10-CM | POA: Diagnosis not present

## 2023-05-22 DIAGNOSIS — Z9889 Other specified postprocedural states: Secondary | ICD-10-CM | POA: Diagnosis not present

## 2023-05-22 DIAGNOSIS — M25612 Stiffness of left shoulder, not elsewhere classified: Secondary | ICD-10-CM | POA: Diagnosis not present

## 2023-05-22 DIAGNOSIS — M25512 Pain in left shoulder: Secondary | ICD-10-CM | POA: Diagnosis not present

## 2023-05-22 DIAGNOSIS — R29898 Other symptoms and signs involving the musculoskeletal system: Secondary | ICD-10-CM | POA: Diagnosis not present

## 2023-05-22 DIAGNOSIS — S46012D Strain of muscle(s) and tendon(s) of the rotator cuff of left shoulder, subsequent encounter: Secondary | ICD-10-CM | POA: Diagnosis not present

## 2023-05-28 DIAGNOSIS — L281 Prurigo nodularis: Secondary | ICD-10-CM | POA: Diagnosis not present

## 2023-05-28 DIAGNOSIS — L280 Lichen simplex chronicus: Secondary | ICD-10-CM | POA: Diagnosis not present

## 2023-05-28 DIAGNOSIS — L570 Actinic keratosis: Secondary | ICD-10-CM | POA: Diagnosis not present

## 2023-06-01 DIAGNOSIS — R030 Elevated blood-pressure reading, without diagnosis of hypertension: Secondary | ICD-10-CM | POA: Diagnosis not present

## 2023-06-01 DIAGNOSIS — Z6833 Body mass index (BMI) 33.0-33.9, adult: Secondary | ICD-10-CM | POA: Diagnosis not present

## 2023-06-01 DIAGNOSIS — T148XXA Other injury of unspecified body region, initial encounter: Secondary | ICD-10-CM | POA: Diagnosis not present

## 2023-06-05 DIAGNOSIS — Z9889 Other specified postprocedural states: Secondary | ICD-10-CM | POA: Diagnosis not present

## 2023-06-05 DIAGNOSIS — M25512 Pain in left shoulder: Secondary | ICD-10-CM | POA: Diagnosis not present

## 2023-06-05 DIAGNOSIS — M25612 Stiffness of left shoulder, not elsewhere classified: Secondary | ICD-10-CM | POA: Diagnosis not present

## 2023-06-05 DIAGNOSIS — R29898 Other symptoms and signs involving the musculoskeletal system: Secondary | ICD-10-CM | POA: Diagnosis not present

## 2023-06-05 DIAGNOSIS — S46012D Strain of muscle(s) and tendon(s) of the rotator cuff of left shoulder, subsequent encounter: Secondary | ICD-10-CM | POA: Diagnosis not present

## 2023-06-19 DIAGNOSIS — Z9889 Other specified postprocedural states: Secondary | ICD-10-CM | POA: Diagnosis not present

## 2023-06-19 DIAGNOSIS — R29898 Other symptoms and signs involving the musculoskeletal system: Secondary | ICD-10-CM | POA: Diagnosis not present

## 2023-06-19 DIAGNOSIS — M25512 Pain in left shoulder: Secondary | ICD-10-CM | POA: Diagnosis not present

## 2023-06-19 DIAGNOSIS — M25612 Stiffness of left shoulder, not elsewhere classified: Secondary | ICD-10-CM | POA: Diagnosis not present

## 2023-06-19 DIAGNOSIS — S46012D Strain of muscle(s) and tendon(s) of the rotator cuff of left shoulder, subsequent encounter: Secondary | ICD-10-CM | POA: Diagnosis not present

## 2023-07-02 DIAGNOSIS — S46012D Strain of muscle(s) and tendon(s) of the rotator cuff of left shoulder, subsequent encounter: Secondary | ICD-10-CM | POA: Diagnosis not present

## 2023-07-02 DIAGNOSIS — Z9889 Other specified postprocedural states: Secondary | ICD-10-CM | POA: Diagnosis not present

## 2023-07-02 DIAGNOSIS — R29898 Other symptoms and signs involving the musculoskeletal system: Secondary | ICD-10-CM | POA: Diagnosis not present

## 2023-07-02 DIAGNOSIS — M25512 Pain in left shoulder: Secondary | ICD-10-CM | POA: Diagnosis not present

## 2023-07-02 DIAGNOSIS — M25612 Stiffness of left shoulder, not elsewhere classified: Secondary | ICD-10-CM | POA: Diagnosis not present

## 2023-07-05 DIAGNOSIS — H5213 Myopia, bilateral: Secondary | ICD-10-CM | POA: Diagnosis not present

## 2023-07-05 DIAGNOSIS — H524 Presbyopia: Secondary | ICD-10-CM | POA: Diagnosis not present

## 2023-08-17 ENCOUNTER — Encounter: Payer: Self-pay | Admitting: *Deleted

## 2023-08-21 DIAGNOSIS — L281 Prurigo nodularis: Secondary | ICD-10-CM | POA: Diagnosis not present

## 2023-08-21 DIAGNOSIS — L280 Lichen simplex chronicus: Secondary | ICD-10-CM | POA: Diagnosis not present

## 2023-08-21 DIAGNOSIS — L570 Actinic keratosis: Secondary | ICD-10-CM | POA: Diagnosis not present

## 2023-08-27 DIAGNOSIS — S46012D Strain of muscle(s) and tendon(s) of the rotator cuff of left shoulder, subsequent encounter: Secondary | ICD-10-CM | POA: Diagnosis not present

## 2023-08-30 DIAGNOSIS — N183 Chronic kidney disease, stage 3 unspecified: Secondary | ICD-10-CM | POA: Diagnosis not present

## 2023-08-30 DIAGNOSIS — D72829 Elevated white blood cell count, unspecified: Secondary | ICD-10-CM | POA: Diagnosis not present

## 2023-08-30 DIAGNOSIS — E559 Vitamin D deficiency, unspecified: Secondary | ICD-10-CM | POA: Diagnosis not present

## 2023-08-30 DIAGNOSIS — E876 Hypokalemia: Secondary | ICD-10-CM | POA: Diagnosis not present

## 2023-08-30 DIAGNOSIS — R739 Hyperglycemia, unspecified: Secondary | ICD-10-CM | POA: Diagnosis not present

## 2023-08-30 DIAGNOSIS — R5383 Other fatigue: Secondary | ICD-10-CM | POA: Diagnosis not present

## 2023-09-04 DIAGNOSIS — E559 Vitamin D deficiency, unspecified: Secondary | ICD-10-CM | POA: Diagnosis not present

## 2023-09-04 DIAGNOSIS — M797 Fibromyalgia: Secondary | ICD-10-CM | POA: Diagnosis not present

## 2023-12-06 DIAGNOSIS — H43393 Other vitreous opacities, bilateral: Secondary | ICD-10-CM | POA: Diagnosis not present

## 2023-12-07 DIAGNOSIS — H33001 Unspecified retinal detachment with retinal break, right eye: Secondary | ICD-10-CM | POA: Diagnosis not present

## 2023-12-07 DIAGNOSIS — Z01818 Encounter for other preprocedural examination: Secondary | ICD-10-CM | POA: Diagnosis not present

## 2023-12-14 DIAGNOSIS — H33001 Unspecified retinal detachment with retinal break, right eye: Secondary | ICD-10-CM | POA: Diagnosis not present

## 2024-01-11 DIAGNOSIS — H33001 Unspecified retinal detachment with retinal break, right eye: Secondary | ICD-10-CM | POA: Diagnosis not present

## 2024-02-22 DIAGNOSIS — H33001 Unspecified retinal detachment with retinal break, right eye: Secondary | ICD-10-CM | POA: Diagnosis not present

## 2024-02-28 DIAGNOSIS — Z131 Encounter for screening for diabetes mellitus: Secondary | ICD-10-CM | POA: Diagnosis not present

## 2024-02-28 DIAGNOSIS — N183 Chronic kidney disease, stage 3 unspecified: Secondary | ICD-10-CM | POA: Diagnosis not present

## 2024-02-28 DIAGNOSIS — I10 Essential (primary) hypertension: Secondary | ICD-10-CM | POA: Diagnosis not present

## 2024-02-28 DIAGNOSIS — R5383 Other fatigue: Secondary | ICD-10-CM | POA: Diagnosis not present

## 2024-03-04 DIAGNOSIS — M797 Fibromyalgia: Secondary | ICD-10-CM | POA: Diagnosis not present

## 2024-03-04 DIAGNOSIS — I10 Essential (primary) hypertension: Secondary | ICD-10-CM | POA: Diagnosis not present

## 2024-04-09 DIAGNOSIS — H35371 Puckering of macula, right eye: Secondary | ICD-10-CM | POA: Diagnosis not present

## 2024-04-09 DIAGNOSIS — H31001 Unspecified chorioretinal scars, right eye: Secondary | ICD-10-CM | POA: Diagnosis not present

## 2024-05-20 DIAGNOSIS — H25812 Combined forms of age-related cataract, left eye: Secondary | ICD-10-CM | POA: Diagnosis not present

## 2024-05-20 DIAGNOSIS — H25811 Combined forms of age-related cataract, right eye: Secondary | ICD-10-CM | POA: Diagnosis not present
